# Patient Record
Sex: Male | Born: 1945
Health system: Southern US, Community
[De-identification: ages and names within clinical notes are randomized; demographics above are authoritative.]

## PROBLEM LIST (undated history)

## (undated) DIAGNOSIS — J449 Chronic obstructive pulmonary disease, unspecified: Secondary | ICD-10-CM

## (undated) DIAGNOSIS — H269 Unspecified cataract: Secondary | ICD-10-CM

## (undated) DIAGNOSIS — F419 Anxiety disorder, unspecified: Secondary | ICD-10-CM

## (undated) DIAGNOSIS — R0902 Hypoxemia: Secondary | ICD-10-CM

## (undated) DIAGNOSIS — J439 Emphysema, unspecified: Secondary | ICD-10-CM

## (undated) DIAGNOSIS — T7840XA Allergy, unspecified, initial encounter: Secondary | ICD-10-CM

## (undated) DIAGNOSIS — K635 Polyp of colon: Secondary | ICD-10-CM

## (undated) DIAGNOSIS — M1990 Unspecified osteoarthritis, unspecified site: Secondary | ICD-10-CM

## (undated) DIAGNOSIS — Z5189 Encounter for other specified aftercare: Secondary | ICD-10-CM

## (undated) DIAGNOSIS — E785 Hyperlipidemia, unspecified: Secondary | ICD-10-CM

## (undated) HISTORY — PX: COLONOSCOPY: SHX174

## (undated) HISTORY — DX: Anxiety disorder, unspecified: F41.9

## (undated) HISTORY — DX: Emphysema, unspecified: J43.9

## (undated) HISTORY — DX: Hyperlipidemia, unspecified: E78.5

## (undated) HISTORY — PX: HERNIA REPAIR: SHX51

## (undated) HISTORY — PX: POLYPECTOMY: SHX149

## (undated) HISTORY — DX: Chronic obstructive pulmonary disease, unspecified: J44.9

## (undated) HISTORY — DX: Hypoxemia: R09.02

## (undated) HISTORY — PX: TONSILLECTOMY: SUR1361

## (undated) HISTORY — DX: Unspecified cataract: H26.9

## (undated) HISTORY — DX: Allergy, unspecified, initial encounter: T78.40XA

## (undated) HISTORY — DX: Polyp of colon: K63.5

## (undated) HISTORY — DX: Unspecified osteoarthritis, unspecified site: M19.90

## (undated) HISTORY — DX: Encounter for other specified aftercare: Z51.89

---

## 1983-04-12 HISTORY — PX: LUNG REMOVAL, PARTIAL: SHX233

## 2006-02-15 ENCOUNTER — Ambulatory Visit (HOSPITAL_COMMUNITY): Admission: RE | Admit: 2006-02-15 | Discharge: 2006-02-15 | Payer: Self-pay | Admitting: General Surgery

## 2010-02-16 ENCOUNTER — Encounter (INDEPENDENT_AMBULATORY_CARE_PROVIDER_SITE_OTHER): Payer: Self-pay | Admitting: *Deleted

## 2010-02-24 ENCOUNTER — Encounter (INDEPENDENT_AMBULATORY_CARE_PROVIDER_SITE_OTHER): Payer: Self-pay | Admitting: *Deleted

## 2010-02-25 ENCOUNTER — Ambulatory Visit: Payer: Self-pay | Admitting: Gastroenterology

## 2010-05-11 NOTE — Letter (Signed)
Summary: [facility] Instructions  [facility]  [address] [place], [place] [address]   Phone: [phone]  Fax: [phone]       KOLBI ALTADONNA    May 20, 1965    MRN: [id number]        Procedure Day Joshua Cross:  [facility]  03/10/10     Arrival Time:  10:30AM     Procedure Time:  11:30AM     Location of Procedure:                    _X _  [facility] (4th Floor)                       PREPARATION FOR COLONOSCOPY WITH MOVIPREP   Starting 5 days prior to your procedure 03/05/10 do not eat nuts, seeds, popcorn, corn, beans, peas,  salads, or any raw vegetables.  Do not take any fiber supplements (e.g. Metamucil, Citrucel, and Benefiber).  THE DAY BEFORE YOUR PROCEDURE         DATE: 03/09/10   DAY: TUESDAY  1.  Drink clear liquids the entire day-NO SOLID FOOD  2.  Do not drink anything colored red or purple.  Avoid juices with pulp.  No orange juice.  3.  Drink at least 64 oz. (8 glasses) of fluid/clear liquids during the day to prevent dehydration and help the prep work efficiently.  CLEAR LIQUIDS INCLUDE: Water Jello Ice Popsicles Tea (sugar ok, no milk/cream) Powdered fruit flavored drinks Coffee (sugar ok, no milk/cream) Gatorade Juice: apple, white grape, white cranberry  Lemonade Clear bullion, consomm, broth Carbonated beverages (any kind) Strained chicken noodle soup Hard Candy                             4.  In the morning, mix first dose of MoviPrep solution:    Empty 1 Pouch A and 1 Pouch B into the disposable container    Add lukewarm drinking water to the top line of the container. Mix to dissolve    Refrigerate (mixed solution should be used within 24 hrs)  5.  Begin drinking the prep at 5:00 p.m. The MoviPrep container is divided by 4 marks.   Every 15 minutes drink the solution down to the next mark (approximately 8 oz) until the full liter is complete.   6.  Follow completed prep with 16 oz of clear liquid of your  choice (Nothing red or purple).  Continue to drink clear liquids until bedtime.  7.  Before going to bed, mix second dose of MoviPrep solution:    Empty 1 Pouch A and 1 Pouch B into the disposable container    Add lukewarm drinking water to the top line of the container. Mix to dissolve    Refrigerate  THE DAY OF YOUR PROCEDURE      DATE: 03/10/10   DAY: WEDNESDAY  Beginning at 6:30AM (5 hours before procedure):         1. Every 15 minutes, drink the solution down to the next mark (approx 8 oz) until the full liter is complete.  2. Follow completed prep with 16 oz. of clear liquid of your choice.    3. You may drink clear liquids until 9:30AM (2 HOURS BEFORE PROCEDURE).   MEDICATION INSTRUCTIONS  Unless otherwise instructed, you should take regular prescription medications with a small sip of water   as early as possible the  morning of your procedure.        OTHER INSTRUCTIONS  You will need a responsible adult at least 66 years of age to accompany you and drive you home.   This person must remain in the waiting room during your procedure.  Wear loose fitting clothing that is easily removed.  Leave jewelry and other valuables at home.  However, you may wish to bring a book to read or  an iPod/MP3 player to listen to music as you wait for your procedure to start.  Remove all body piercing jewelry and leave at home.  Total time from sign-in until discharge is approximately 2-3 hours.  You should go home directly after your procedure and rest.  You can resume normal activities the  day after your procedure.  The day of your procedure you should not:   Drive   Make legal decisions   Operate machinery   Drink alcohol   Return to work  You will receive specific instructions about eating, activities and medications before you leave.    The above instructions have been reviewed and explained to me by   Wyona Almas RN  February 25, 2010 2:36 PM     I  fully understand and can verbalize these instructions _____________________________ Date _________

## 2010-05-11 NOTE — Miscellaneous (Signed)
Summary: [facility] Previsit/prep  Clinical Lists Changes  Medications: Added new medication of MOVIPREP 100 GM  SOLR (PEG-KCL-NACL-NASULF-NA ASC-C) As per prep instructions. - Signed Rx of MOVIPREP 100 GM  SOLR (PEG-KCL-NACL-NASULF-NA ASC-C) As per prep instructions.;  #1 x 0;  Signed;  Entered by: Wyona Almas RN;  Authorized by: Mardella Layman MD [facility];  Method used: Electron[facility] to [facility]  [phone]*, [address], [place], [place]  [address], Ph: [phone] or [phone], Fax: [phone] Allergies: Added new allergy or adverse reaction of PENICILLIN Added new allergy or adverse reaction of ERYTHROMYCIN Observations: Added new observation of NKA: F (02/25/2010 14:01)    Prescriptions: MOVIPREP 100 GM  SOLR (PEG-KCL-NACL-NASULF-NA ASC-C) As per prep instructions.  #1 x 0   Entered by:   Wyona Almas RN   Authorized by:   Mardella Layman MD [facility]   Signed by:   Wyona Almas RN on 02/25/2010   Method used:   [facility]ronically to        [facility]  [phone]* (retail)       [address] [place], [place]  [address]       Ph: [phone] or [phone]       Fax: [phone]   RxID:   [id number]

## 2010-05-11 NOTE — Letter (Signed)
Summary: Pre Visit Letter Revised  [facility]  [address] [place], [place] [address]   Phone: [phone]  Fax: [phone]        02/16/2010 MRN: [id number]    [facility] [address] [place], [place]  [address]             Procedure Date:  03/10/10   Welcome to the Gastroenterology Division at [facility].    You are scheduled to see a nurse for your pre-procedure visit on Wednesday, 02/24/10 at 3:30 p.m.  on the 3rd floor at [facility], [address]  We ask that you try to arrive at our office 15 minutes prior to your appointment time to allow for check-in.  Please take a minute to review the attached form.  If you answer "Yes" to one or more of the questions on the first page, we ask that you call the person listed at your earliest opportunity.  If you answer "No" to all of the questions, please complete the rest of the form and bring it to your appointment.    Your nurse visit will consist of discussing your medical and surgical history, your immediate family medical history, and your medications.   If you are unable to list all of your medications on the form, please bring the medication bottles to your appointment and we will list them.  We will need to be aware of both prescribed and over the counter drugs.  We will need to know exact dosage information as well.    Please be prepared to read and sign documents such as consent forms, a financial agreement, and acknowledgement forms.  If necessary, and with your consent, a friend or relative is welcome to sit-in on the nurse visit with you.  Please bring your insurance card so that we may make a copy of it.  If your insurance requires a referral to see a specialist, please bring your referral form from your primary care physician.  No co-pay is required for this nurse visit.     If you cannot keep your appointment, please call [phone] to cancel or reschedule prior to your  appointment date.  This allows [place] the opportunity to schedule an appointment for another patient in need of care.    Thank you for choosing  Gastroenterology for your medical needs.  We appreciate the opportunity to care for you.  Please visit [place] at our website  to learn more about our practice.  Sincerely, The Gastroenterology Division

## 2010-08-27 NOTE — Op Note (Signed)
NAME:  Joshua Cross, Joshua Cross            ACCOUNT NO.:  [id number]   MEDICAL RECORD NO.:  [id number]          PATIENT TYPE:  AMB   LOCATION:  DAY                          FACILITY:  [facility]   PHYSICIAN:  Ollen Gross. Vernell Morgans, M.D. DATE OF BIRTH:  07-27-1945   DATE OF PROCEDURE:  02/15/2006  DATE OF DISCHARGE:                               OPERATIVE REPORT   PREOPERATIVE DIAGNOSIS:  Bilateral hernias.   POSTOPERATIVE DIAGNOSIS:  Bilateral inguinal hernias.   PROCEDURES:  Bilateral inguinal hernia repairs with mesh.   SURGEON:  Ollen Gross. Carolynne Edouard, M.D.   ANESTHESIA:  General.   PROCEDURE:  After informed consent was obtained, the patient was brought  to the operating placed in supine position on the operating table.  After induction of general anesthesia, the patient's abdomen, both  groins were prepped with Betadine and draped in usual sterile manner.  Attention was first turned to the right groin.  This area was  infiltrated 4% Marcaine and small incision was made from the edge of  pubic tubercle on the right towards the anterior superior iliac spine  for a distance of about 5 cm this incision was carried down through the  skin and subcutaneous tissue sharply with electrocautery until the  fascia of the external oblique was encountered.  Bridging vein was  clamped with hemostats, divided and ligated 3-0 silk ties.  The fascia  of the external oblique was opened along its fibers towards apex of the  external ring with a 15 blade knife and Metzenbaum scissors.  A  Weitlaner retractor was then deployed.  Blunt dissection of the cord  structures was carried out at the edge of the pubic tubercle until the  cord structures could be surrounded between two fingers.  A 1/2-inch  Penrose drain was placed around the cord structures for retraction  purposes.  The cord structures were gently skeletonized by combination  of blunt hemostat dissection and sharp dissection with electrocautery.  No hernia sac was  associated with the cord.  The patient did have defect  medial to this in the floor of the canal consistent with a direct  hernia.  This area was very broad-based and was therefore reduced  manually.  The floor of the canal was then repaired with a running 2-0  Vicryl stitch.  The tails of the stitch were left long at the edge of  the cord.  Next a piece of three piece of 3 x 6 Ultrapro mesh was chosen  and cut to fit.  The mesh was sewed inferiorly to the shelving edge of  inguinal ligament with a running 2-0 Prolene stitch.  Tails were cut in  the mesh laterally.  The tails of the Vicryl stitch were brought through  the mesh where it abutted the cord and tied down.  The tails of the mesh  were wrapped around the cord structures laterally.  Superiorly the mesh  was sewed to the muscular aponeurotic strength layer of the  transversalis with interrupted 2-0 Prolene vertical mattress stitches.  The tails of mesh were anchored laterally to the cord to the shelving  edge of  inguinal ligament with interrupted 2-0 Prolene stitch.  Once  this was accomplished, the mesh appeared to be in good position.  The  wound was irrigated copious amounts of saline.  The ilioinguinal nerve  had been identified and clamped proximally and distally, ligated and  tied with 3-0 silk ties.  The external oblique was then reapproximated  with a running 2-0 Vicryl stitch.  The wound was infiltrated more  quarter percent Marcaine.  The subcutaneous fascia was closed a running  3-0 Vicryl stitch and the skin was closed a running 4-0 Monocryl  subcuticular stitch.  Next attention was turned to the left groin and  again left groin was infiltrated 0.25% Marcaine with epinephrine.  A  small incision was made from the edge of the pubic tubercle on the left  towards the anterior superior iliac spine for distance of about 5 cm.  This incision was carried down to the skin and subcutaneous tissue  sharply with electrocautery  until the fascia of the external oblique was  encountered.  A small bridging vein was clamped with hemostats, divided  and ligated 3-0 silk ties.  The fascia of the external oblique was  opened along its fibers to the apex of the external ring with 15 blade  knife and Metzenbaum scissors.  A Weitlaner retractor was then deployed.  Blunt dissection was carried out of the cord structures at the edge of  the pubic tubercle until the cord structures could be surrounded between  two fingers.  A 1/2-inch Penrose drain was placed around the cord  structures for retraction purposes.  The cord was gently skeletonized by  combination of blunt hemostat dissection and sharp dissection with  electrocautery but no hernia sac was associated with the cord. Medial to  this the floor of the inguinal canal was weak with an obvious bulge.  This direct hernia had very broad base.  It was therefore reduced and  the floor of the inguinal canal was repaired with a running 2-0 Vicryl  stitch.  The tails of the Vicryl were left long at the edge of the cord.  Next the a 3 x 6 piece of Ultrapro mesh was chosen and cut to fit.  The  mesh was sewed inferiorly to the shelving edge of inguinal ligament with  a running 2-0 Prolene stitch.  Tails were cut in the mesh laterally.  The tails of Vicryl for brought through the mesh at the edge of the cord  and tied down.  The tails of mesh was then wrapped around the cord  structures laterally.  The mesh was then sewed to the muscular  aponeurotic strength layer of the transversalis superiorly with  interrupted 2-0 Prolene vertical mattress stitches and the tails of the  mesh were anchored to the shelving edge of the ligament lateral to the  cord with interrupted 2-0 Prolene stitch.  Once this was accomplished,  the mesh was in good position.  The wound was irrigated copious amounts  of saline.  The ilioinguinal nerve had been identified, clamped proximally and distally with  hemostats, divided and ligated 3-0 silk  ties.  The fascia of the external oblique was then reapproximated with  running 2-0 Vicryl stitch.  Wound was infiltrated then with the rest of  the 0.25% Marcaine.  The subcutaneous fascia was closed with running 3-0  Vicryl stitch and skin was closed with a running 4-0 Monocryl  subcuticular stitch.  Benzoin, Steri-Strips, sterile dressings were  applied.  The patient tolerated  well.  At the end of the case, all  needle, sponge, instrument counts correct.  The patient was then  awakened, taken recovery in stable condition.  Both the patient's  testicles were down in the sac at the end of the case.      Ollen Gross. Vernell Morgans, M.D.  Electronically Signed     PST/MEDQ  D:  02/16/2006  T:  02/17/2006  Job:  [id number]

## 2016-06-07 ENCOUNTER — Ambulatory Visit (INDEPENDENT_AMBULATORY_CARE_PROVIDER_SITE_OTHER): Payer: Medicare Other | Admitting: Internal Medicine

## 2016-06-07 ENCOUNTER — Ambulatory Visit (INDEPENDENT_AMBULATORY_CARE_PROVIDER_SITE_OTHER)
Admission: RE | Admit: 2016-06-07 | Discharge: 2016-06-07 | Disposition: A | Discharge status: Home / Self Care | Payer: Medicare Other | Source: Ambulatory Visit | Attending: Internal Medicine | Admitting: Internal Medicine

## 2016-06-07 VITALS — BP 110/68 | HR 63 | Temp 97.7°F | Resp 16 | Ht 68.0 in | Wt 141.1 lb

## 2016-06-07 DIAGNOSIS — J418 Mixed simple and mucopurulent chronic bronchitis: Secondary | ICD-10-CM | POA: Insufficient documentation

## 2016-06-07 DIAGNOSIS — R05 Cough: Secondary | ICD-10-CM | POA: Diagnosis not present

## 2016-06-07 DIAGNOSIS — R059 Cough, unspecified: Secondary | ICD-10-CM

## 2016-06-07 MED ORDER — FLUTICASONE-UMECLIDIN-VILANT 100-62.5-25 MCG/INH IN AEPB: 1.0000 | INHALATION_SPRAY | Freq: Every day | RESPIRATORY_TRACT | 11 refills | Status: DC

## 2016-06-07 NOTE — Patient Instructions (Signed)
Cough, Adult Coughing is a reflex that clears your throat and your airways. Coughing helps to heal and protect your lungs. It is normal to cough occasionally, but a cough that happens with other symptoms or lasts a long time may be a sign of a condition that needs treatment. A cough may last only 2-3 weeks (acute), or it may last longer than 8 weeks (chronic). What are the causes? Coughing is commonly caused by:  Breathing in substances that irritate your lungs.  A viral or bacterial respiratory infection.  Allergies.  Asthma.  Postnasal drip.  Smoking.  Acid backing up from the stomach into the esophagus (gastroesophageal reflux).  Certain medicines.  Chronic lung problems, including COPD (or rarely, lung cancer).  Other medical conditions such as heart failure.  Follow these instructions at home: Pay attention to any changes in your symptoms. Take these actions to help with your discomfort:  Take medicines only as told by your health care provider. ? If you were prescribed an antibiotic medicine, take it as told by your health care provider. Do not stop taking the antibiotic even if you start to feel better. ? Talk with your health care provider before you take a cough suppressant medicine.  Drink enough fluid to keep your urine clear or pale yellow.  If the air is dry, use a cold steam vaporizer or humidifier in your bedroom or your home to help loosen secretions.  Avoid anything that causes you to cough at work or at home.  If your cough is worse at night, try sleeping in a semi-upright position.  Avoid cigarette smoke. If you smoke, quit smoking. If you need help quitting, ask your health care provider.  Avoid caffeine.  Avoid alcohol.  Rest as needed.  Contact a health care provider if:  You have new symptoms.  You cough up pus.  Your cough does not get better after 2-3 weeks, or your cough gets worse.  You cannot control your cough with suppressant  medicines and you are losing sleep.  You develop pain that is getting worse or pain that is not controlled with pain medicines.  You have a fever.  You have unexplained weight loss.  You have night sweats. Get help right away if:  You cough up blood.  You have difficulty breathing.  Your heartbeat is very fast. This information is not intended to replace advice given to you by your health care provider. Make sure you discuss any questions you have with your health care provider. Document Released: 09/24/2010 Document Revised: 09/03/2015 Document Reviewed: 06/04/2014 Elsevier Interactive Patient Education  2017 Elsevier Inc.  

## 2016-06-07 NOTE — Progress Notes (Signed)
Pre visit review using our clinic review tool, if applicable. No additional management support is needed unless otherwise documented below in the visit note. 

## 2016-06-07 NOTE — Progress Notes (Signed)
Subjective:  Patient ID: Joshua Cross, male    DOB: [birth year]  Age: 71 y.o. MRN: [id number]  CC: Cough   HE NEEDS A NEW PCP   HPI Joshua Cross presents for Establishing a relationship with a new PCP. His primary care physician in [place] is retiring. I don't have any medical records today. He has a history of COPD and has chronic, intermittent nonproductive cough with occasional wheezing and shortness of breath. He has been using Advair discus and Spiriva inhaler to control the symptoms. He tells me he just had a physical about a month or 2 ago. He will have those records forwarded to me.  History Joshua Cross has a past medical history of COPD (chronic obstructive pulmonary disease) ([place]) and Hyperlipidemia.   He has a past surgical history that includes Hernia repair and Lung removal, partial (Right, 1985).   His family history includes Alcohol abuse in his mother; Depression in his sister; Heart disease in his sister; Stroke in his father.He reports that he quit smoking about 9 years ago. His smoking use included Cigarettes. He has never used smokeless tobacco. He reports that he does not drink alcohol or use drugs.  No outpatient prescriptions prior to visit.   No facility-administered medications prior to visit.     ROS Review of Systems  Constitutional: Negative for activity change, appetite change, chills, fatigue, fever and unexpected weight change.  HENT: Negative.  Negative for sore throat and trouble swallowing.   Respiratory: Positive for cough, shortness of breath and wheezing. Negative for chest tightness.   Cardiovascular: Negative.  Negative for chest pain, palpitations and leg swelling.  Gastrointestinal: Negative.  Negative for abdominal pain, constipation, diarrhea, nausea and vomiting.  Endocrine: Negative.   Genitourinary: Negative.  Negative for difficulty urinating.  Musculoskeletal: Negative for back pain, joint swelling and myalgias.  Skin:  Negative.   Allergic/Immunologic: Negative.   Neurological: Negative.  Negative for dizziness.  Hematological: Negative.  Negative for adenopathy. Does not bruise/bleed easily.  Psychiatric/Behavioral: Negative.     Objective:  BP 110/68 (BP Location: Left Arm, Patient Position: Sitting, Cuff Size: Normal)   Pulse 63   Temp 97.7 F (36.5 C) (Oral)   Resp 16   Ht 5\' 8"  (1.727 m)   Wt 141 lb 1.3 oz (64 kg)   SpO2 93%   BMI 21.45 kg/m   Physical Exam  No results found for: WBC, HGB, HCT, PLT, GLUCOSE, CHOL, TRIG, HDL, LDLDIRECT, LDLCALC, ALT, AST, NA, K, CL, CREATININE, BUN, CO2, TSH, PSA, INR, GLUF, HGBA1C, MICROALBUR  Assessment & Plan:   Joshua Cross was seen today for cough.  Diagnoses and all orders for this visit:  Mixed simple and mucopurulent chronic bronchitis ([place])- will consolidate his inhaler therapy to 1 inhaler that contains the LAMA/LABA/ICS. -     Fluticasone-Umeclidin-Vilant (TRELEGY ELLIPTA) 100-62.5-25 MCG/INH AEPB; Inhale 1 Act into the lungs daily. -     DG Chest 2 View; Future  Cough- his chest x-ray reveals scarring and emphysema but there is no evidence of malignancy or pneumonia. -     DG Chest 2 View; Future   I have discontinued Joshua Cross's SPIRIVA HANDIHALER and ADVAIR DISKUS. I am also having him start on Fluticasone-Umeclidin-Vilant. Additionally, I am having him maintain his rosuvastatin, PARoxetine, LUMIGAN, and PROAIR HFA.  Meds ordered this encounter  Medications  . DISCONTD: SPIRIVA HANDIHALER 18 MCG inhalation capsule    Refill:  0  . rosuvastatin (CRESTOR) 10 MG tablet    Refill:  0  . PARoxetine (PAXIL) 20 MG tablet    Refill:  0  . DISCONTD: ADVAIR DISKUS 250-50 MCG/DOSE AEPB    Refill:  0  . LUMIGAN 0.01 % SOLN    Sig: instill 1 drop into both eyes once daily at bedtime    Refill:  0  . PROAIR HFA 108 (90 Base) MCG/ACT inhaler    Sig: inhale 2 puffs by mouth every 4 hours    Refill:  0  . Fluticasone-Umeclidin-Vilant (TRELEGY  ELLIPTA) 100-62.5-25 MCG/INH AEPB    Sig: Inhale 1 Act into the lungs daily.    Dispense:  28 each    Refill:  11     Follow-up: Return in about 4 months (around 10/05/2016).  Scarlette Calico, MD

## 2016-06-08 ENCOUNTER — Encounter: Payer: Self-pay | Admitting: Internal Medicine

## 2016-06-13 ENCOUNTER — Other Ambulatory Visit: Payer: Self-pay | Admitting: Internal Medicine

## 2016-07-26 ENCOUNTER — Other Ambulatory Visit: Payer: Self-pay | Admitting: Internal Medicine

## 2016-07-26 ENCOUNTER — Telehealth: Payer: Self-pay | Admitting: Internal Medicine

## 2016-07-26 DIAGNOSIS — J301 Allergic rhinitis due to pollen: Secondary | ICD-10-CM

## 2016-07-26 MED ORDER — METHYLPREDNISOLONE 4 MG PO TBPK: ORAL_TABLET | ORAL | 0 refills | Status: DC

## 2016-07-26 NOTE — Telephone Encounter (Signed)
ROI faxed to [facility] on 06/10/2016 refaxed 07/26/2016. [facility]

## 2016-08-11 ENCOUNTER — Telehealth: Payer: Self-pay | Admitting: Internal Medicine

## 2016-08-11 NOTE — Telephone Encounter (Signed)
[facility] pages sent to Dr.Thomas Jones at [facility]. PWR

## 2016-09-02 ENCOUNTER — Encounter: Payer: Self-pay | Admitting: Internal Medicine

## 2016-09-02 ENCOUNTER — Other Ambulatory Visit: Payer: Self-pay | Admitting: Internal Medicine

## 2016-09-02 DIAGNOSIS — Z1211 Encounter for screening for malignant neoplasm of colon: Secondary | ICD-10-CM

## 2016-09-02 DIAGNOSIS — Z1159 Encounter for screening for other viral diseases: Secondary | ICD-10-CM | POA: Insufficient documentation

## 2016-09-02 DIAGNOSIS — J418 Mixed simple and mucopurulent chronic bronchitis: Secondary | ICD-10-CM

## 2016-09-02 MED ORDER — FLUTICASONE-UMECLIDIN-VILANT 100-62.5-25 MCG/INH IN AEPB: 1.0000 | INHALATION_SPRAY | Freq: Every day | RESPIRATORY_TRACT | 11 refills | Status: DC

## 2016-09-06 ENCOUNTER — Telehealth: Payer: Self-pay

## 2016-09-06 NOTE — Telephone Encounter (Signed)
Key: [id number]

## 2016-09-12 ENCOUNTER — Other Ambulatory Visit: Payer: Self-pay | Admitting: Internal Medicine

## 2016-09-12 DIAGNOSIS — J418 Mixed simple and mucopurulent chronic bronchitis: Secondary | ICD-10-CM

## 2016-09-12 MED ORDER — CEFDINIR 300 MG PO CAPS: 300.0000 mg | ORAL_CAPSULE | Freq: Two times a day (BID) | ORAL | 0 refills | Status: AC

## 2016-09-14 NOTE — Telephone Encounter (Signed)
PA was denied. Is there an alternative that can be prescribed.  

## 2016-09-14 NOTE — Telephone Encounter (Signed)
He has enough samples for now

## 2016-09-14 NOTE — Telephone Encounter (Signed)
Will leave as it for now. Closing note

## 2016-09-15 ENCOUNTER — Telehealth: Payer: Self-pay | Admitting: Internal Medicine

## 2016-09-15 NOTE — Telephone Encounter (Signed)
Records have been placed on 09/13/16 AM DOD-Dr.Perry's desk for review.

## 2016-09-19 ENCOUNTER — Encounter: Payer: Self-pay | Admitting: Internal Medicine

## 2016-10-28 ENCOUNTER — Other Ambulatory Visit: Payer: Self-pay | Admitting: Internal Medicine

## 2016-10-28 ENCOUNTER — Encounter: Payer: Self-pay | Admitting: Internal Medicine

## 2016-10-28 DIAGNOSIS — J418 Mixed simple and mucopurulent chronic bronchitis: Secondary | ICD-10-CM

## 2016-10-28 MED ORDER — CEFDINIR 300 MG PO CAPS: 300.0000 mg | ORAL_CAPSULE | Freq: Two times a day (BID) | ORAL | 0 refills | Status: AC

## 2016-11-24 ENCOUNTER — Ambulatory Visit (AMBULATORY_SURGERY_CENTER): Payer: Self-pay

## 2016-11-24 VITALS — Ht 66.5 in | Wt 133.2 lb

## 2016-11-24 DIAGNOSIS — Z860100 Personal history of colon polyps, unspecified: Secondary | ICD-10-CM

## 2016-11-24 MED ORDER — SUPREP BOWEL PREP KIT 17.5-3.13-1.6 GM/177ML PO SOLN: 1.0000 | Freq: Once | ORAL | 0 refills | Status: AC

## 2016-11-24 NOTE — Progress Notes (Signed)
No allergies to eggs or soy No diet meds No home oxygen No past problems with anesthesia  Declined emmi 

## 2016-11-25 ENCOUNTER — Other Ambulatory Visit: Payer: Self-pay | Admitting: Internal Medicine

## 2016-11-25 ENCOUNTER — Encounter: Payer: Self-pay | Admitting: Internal Medicine

## 2016-11-25 DIAGNOSIS — J418 Mixed simple and mucopurulent chronic bronchitis: Secondary | ICD-10-CM

## 2016-11-25 MED ORDER — CEFDINIR 300 MG PO CAPS: 300.0000 mg | ORAL_CAPSULE | Freq: Two times a day (BID) | ORAL | 0 refills | Status: DC

## 2016-11-25 MED ORDER — ROFLUMILAST 250 MCG PO TABS: 1.0000 | ORAL_TABLET | Freq: Every day | ORAL | 11 refills | Status: DC

## 2016-11-29 ENCOUNTER — Telehealth: Payer: Self-pay

## 2016-11-29 NOTE — Telephone Encounter (Signed)
Joshua Cross started PA on 11/28/2016.   KEY: [id number] or [id number]

## 2016-12-06 ENCOUNTER — Inpatient Hospital Stay (HOSPITAL_COMMUNITY)
Admission: EM | Admit: 2016-12-06 | Discharge: 2016-12-08 | DRG: 193 | Disposition: A | Discharge status: Home / Self Care | Payer: Medicare Other | Attending: Internal Medicine | Admitting: Internal Medicine

## 2016-12-06 ENCOUNTER — Emergency Department (HOSPITAL_COMMUNITY): Payer: Medicare Other

## 2016-12-06 ENCOUNTER — Encounter (HOSPITAL_COMMUNITY): Payer: Self-pay | Admitting: *Deleted

## 2016-12-06 DIAGNOSIS — E7800 Pure hypercholesterolemia, unspecified: Secondary | ICD-10-CM | POA: Diagnosis not present

## 2016-12-06 DIAGNOSIS — J441 Chronic obstructive pulmonary disease with (acute) exacerbation: Secondary | ICD-10-CM

## 2016-12-06 DIAGNOSIS — Z880 Allergy status to penicillin: Secondary | ICD-10-CM

## 2016-12-06 DIAGNOSIS — Z79899 Other long term (current) drug therapy: Secondary | ICD-10-CM | POA: Diagnosis not present

## 2016-12-06 DIAGNOSIS — J189 Pneumonia, unspecified organism: Secondary | ICD-10-CM | POA: Diagnosis present

## 2016-12-06 DIAGNOSIS — R55 Syncope and collapse: Secondary | ICD-10-CM | POA: Diagnosis present

## 2016-12-06 DIAGNOSIS — E785 Hyperlipidemia, unspecified: Secondary | ICD-10-CM

## 2016-12-06 DIAGNOSIS — R0902 Hypoxemia: Secondary | ICD-10-CM

## 2016-12-06 DIAGNOSIS — T380X5A Adverse effect of glucocorticoids and synthetic analogues, initial encounter: Secondary | ICD-10-CM | POA: Diagnosis not present

## 2016-12-06 DIAGNOSIS — E860 Dehydration: Secondary | ICD-10-CM | POA: Diagnosis present

## 2016-12-06 DIAGNOSIS — Z881 Allergy status to other antibiotic agents status: Secondary | ICD-10-CM | POA: Diagnosis not present

## 2016-12-06 DIAGNOSIS — Z87891 Personal history of nicotine dependence: Secondary | ICD-10-CM

## 2016-12-06 DIAGNOSIS — J9601 Acute respiratory failure with hypoxia: Secondary | ICD-10-CM

## 2016-12-06 DIAGNOSIS — Z7982 Long term (current) use of aspirin: Secondary | ICD-10-CM | POA: Diagnosis not present

## 2016-12-06 DIAGNOSIS — E871 Hypo-osmolality and hyponatremia: Secondary | ICD-10-CM | POA: Diagnosis not present

## 2016-12-06 DIAGNOSIS — J440 Chronic obstructive pulmonary disease with acute lower respiratory infection: Secondary | ICD-10-CM | POA: Diagnosis present

## 2016-12-06 DIAGNOSIS — R739 Hyperglycemia, unspecified: Secondary | ICD-10-CM | POA: Diagnosis not present

## 2016-12-06 LAB — COMPREHENSIVE METABOLIC PANEL
ALBUMIN: 4.4 g/dL (ref 3.5–5.0)
ALT: 20 U/L (ref 17–63)
ANION GAP: 13 (ref 5–15)
AST: 26 U/L (ref 15–41)
Alkaline Phosphatase: 116 U/L (ref 38–126)
BUN: 8 mg/dL (ref 6–20)
CALCIUM: 9.4 mg/dL (ref 8.9–10.3)
CHLORIDE: 102 mmol/L (ref 101–111)
CO2: 23 mmol/L (ref 22–32)
CREATININE: 0.77 mg/dL (ref 0.61–1.24)
Glucose, Bld: 127 mg/dL — ABNORMAL HIGH (ref 65–99)
Potassium: 3.8 mmol/L (ref 3.5–5.1)
Sodium: 138 mmol/L (ref 135–145)
Total Bilirubin: 0.8 mg/dL (ref 0.3–1.2)
Total Protein: 7.8 g/dL (ref 6.5–8.1)

## 2016-12-06 LAB — TROPONIN I

## 2016-12-06 LAB — CBC WITH DIFFERENTIAL/PLATELET
BASOS ABS: 0 10*3/uL (ref 0.0–0.1)
BASOS PCT: 0 %
EOS PCT: 0 %
Eosinophils Absolute: 0 10*3/uL (ref 0.0–0.7)
HEMATOCRIT: 50.7 % (ref 39.0–52.0)
Hemoglobin: 17.7 g/dL — ABNORMAL HIGH (ref 13.0–17.0)
LYMPHS PCT: 9 %
Lymphs Abs: 1.2 10*3/uL (ref 0.7–4.0)
MCH: 31.7 pg (ref 26.0–34.0)
MCHC: 34.9 g/dL (ref 30.0–36.0)
MCV: 90.7 fL (ref 78.0–100.0)
Monocytes Absolute: 0.4 10*3/uL (ref 0.1–1.0)
Monocytes Relative: 4 %
NEUTROS ABS: 10.8 10*3/uL — AB (ref 1.7–7.7)
Neutrophils Relative %: 87 %
PLATELETS: 215 10*3/uL (ref 150–400)
RBC: 5.59 MIL/uL (ref 4.22–5.81)
RDW: 13.4 % (ref 11.5–15.5)
WBC: 12.4 10*3/uL — AB (ref 4.0–10.5)

## 2016-12-06 LAB — BRAIN NATRIURETIC PEPTIDE: B NATRIURETIC PEPTIDE 5: 53 pg/mL (ref 0.0–100.0)

## 2016-12-06 MED ORDER — LEVOFLOXACIN IN D5W 750 MG/150ML IV SOLN
750.0000 mg | Freq: Every day | INTRAVENOUS | Status: DC
  Administered 2016-12-07 (×2): 750 mg via INTRAVENOUS
  Filled 2016-12-06 (×3): qty 150

## 2016-12-06 MED ORDER — OMEGA-3-ACID ETHYL ESTERS 1 G PO CAPS
1.0000 g | ORAL_CAPSULE | Freq: Two times a day (BID) | ORAL | Status: DC
  Administered 2016-12-07 – 2016-12-08 (×3): 1 g via ORAL
  Filled 2016-12-06 (×3): qty 1

## 2016-12-06 MED ORDER — ACETAMINOPHEN 325 MG PO TABS: 650.0000 mg | ORAL_TABLET | Freq: Four times a day (QID) | ORAL | Status: DC | PRN

## 2016-12-06 MED ORDER — DOXYCYCLINE HYCLATE 100 MG PO TABS
100.0000 mg | ORAL_TABLET | Freq: Once | ORAL | Status: AC
  Administered 2016-12-06: 100 mg via ORAL
  Filled 2016-12-06: qty 1

## 2016-12-06 MED ORDER — IPRATROPIUM-ALBUTEROL 0.5-2.5 (3) MG/3ML IN SOLN
3.0000 mL | RESPIRATORY_TRACT | Status: DC
  Administered 2016-12-06: 3 mL via RESPIRATORY_TRACT
  Filled 2016-12-06: qty 3

## 2016-12-06 MED ORDER — ONDANSETRON HCL 4 MG/2ML IJ SOLN: 4.0000 mg | Freq: Four times a day (QID) | INTRAMUSCULAR | Status: DC | PRN

## 2016-12-06 MED ORDER — MAGNESIUM OXIDE 400 (241.3 MG) MG PO TABS
400.0000 mg | ORAL_TABLET | Freq: Every day | ORAL | Status: DC
  Administered 2016-12-07 – 2016-12-08 (×2): 400 mg via ORAL
  Filled 2016-12-06 (×2): qty 1

## 2016-12-06 MED ORDER — METHYLPREDNISOLONE SODIUM SUCC 125 MG IJ SOLR
60.0000 mg | Freq: Four times a day (QID) | INTRAMUSCULAR | Status: DC
  Administered 2016-12-07 – 2016-12-08 (×7): 60 mg via INTRAVENOUS
  Filled 2016-12-06 (×7): qty 2

## 2016-12-06 MED ORDER — IPRATROPIUM-ALBUTEROL 0.5-2.5 (3) MG/3ML IN SOLN
3.0000 mL | RESPIRATORY_TRACT | Status: DC | PRN
  Administered 2016-12-07: 3 mL via RESPIRATORY_TRACT
  Filled 2016-12-06: qty 3

## 2016-12-06 MED ORDER — HYDROCODONE-ACETAMINOPHEN 5-325 MG PO TABS: 1.0000 | ORAL_TABLET | ORAL | Status: DC | PRN

## 2016-12-06 MED ORDER — ROFLUMILAST 500 MCG PO TABS
250.0000 ug | ORAL_TABLET | Freq: Every day | ORAL | Status: DC
  Administered 2016-12-07 – 2016-12-08 (×2): 250 ug via ORAL
  Filled 2016-12-06 (×2): qty 1

## 2016-12-06 MED ORDER — IOPAMIDOL (ISOVUE-370) INJECTION 76%
INTRAVENOUS | Status: AC
  Filled 2016-12-06: qty 100

## 2016-12-06 MED ORDER — ROSUVASTATIN CALCIUM 10 MG PO TABS
10.0000 mg | ORAL_TABLET | Freq: Every day | ORAL | Status: DC
  Administered 2016-12-07: 10 mg via ORAL
  Filled 2016-12-06: qty 1

## 2016-12-06 MED ORDER — ASPIRIN EC 81 MG PO TBEC
81.0000 mg | DELAYED_RELEASE_TABLET | Freq: Every day | ORAL | Status: DC
  Administered 2016-12-07 – 2016-12-08 (×2): 81 mg via ORAL
  Filled 2016-12-06 (×2): qty 1

## 2016-12-06 MED ORDER — PAROXETINE HCL 20 MG PO TABS
20.0000 mg | ORAL_TABLET | Freq: Every day | ORAL | Status: DC
  Administered 2016-12-07 – 2016-12-08 (×2): 20 mg via ORAL
  Filled 2016-12-06 (×2): qty 1

## 2016-12-06 MED ORDER — METHYLPREDNISOLONE SODIUM SUCC 125 MG IJ SOLR
125.0000 mg | Freq: Once | INTRAMUSCULAR | Status: AC
  Administered 2016-12-06: 125 mg via INTRAVENOUS
  Filled 2016-12-06: qty 2

## 2016-12-06 MED ORDER — LATANOPROST 0.005 % OP SOLN
1.0000 [drp] | Freq: Every day | OPHTHALMIC | Status: DC
  Administered 2016-12-07 (×2): 1 [drp] via OPHTHALMIC
  Filled 2016-12-06: qty 2.5

## 2016-12-06 MED ORDER — RISAQUAD PO CAPS
1.0000 | ORAL_CAPSULE | Freq: Every day | ORAL | Status: DC
  Administered 2016-12-07 – 2016-12-08 (×2): 1 via ORAL
  Filled 2016-12-06 (×2): qty 1

## 2016-12-06 MED ORDER — SODIUM CHLORIDE 0.9 % IV SOLN
INTRAVENOUS | Status: AC
  Administered 2016-12-07: 1000 mL via INTRAVENOUS

## 2016-12-06 MED ORDER — ENOXAPARIN SODIUM 40 MG/0.4ML ~~LOC~~ SOLN
40.0000 mg | Freq: Every day | SUBCUTANEOUS | Status: DC
  Administered 2016-12-07 (×2): 40 mg via SUBCUTANEOUS
  Filled 2016-12-06 (×2): qty 0.4

## 2016-12-06 MED ORDER — IOPAMIDOL (ISOVUE-370) INJECTION 76%
100.0000 mL | Freq: Once | INTRAVENOUS | Status: AC | PRN
Start: 2016-12-06 — End: 2016-12-06
  Administered 2016-12-06: 100 mL via INTRAVENOUS

## 2016-12-06 MED ORDER — UMECLIDINIUM-VILANTEROL 62.5-25 MCG/INH IN AEPB
1.0000 | INHALATION_SPRAY | Freq: Every day | RESPIRATORY_TRACT | Status: DC
  Administered 2016-12-07 – 2016-12-08 (×2): 1 via RESPIRATORY_TRACT
  Filled 2016-12-06: qty 14

## 2016-12-06 MED ORDER — MELATONIN 10 MG PO CAPS: 1.0000 | ORAL_CAPSULE | Freq: Every day | ORAL | Status: DC

## 2016-12-06 NOTE — ED Provider Notes (Signed)
[facility] DEPT Provider Note   CSN: [id number] Arrival date & time: 12/06/16  1755     History   Chief Complaint Chief Complaint  Patient presents with  . Shortness of Breath    HPI JEF FUTCH is a 71 y.o. male.   Shortness of Breath  This is a new problem. The problem occurs continuously.The current episode started 1 to 2 hours ago. The problem has been gradually improving. Associated symptoms include cough and wheezing. Pertinent negatives include no fever, no ear pain, no neck pain, no PND and no orthopnea.    Past Medical History:  Diagnosis Date  . COPD (chronic obstructive pulmonary disease) ([place])   . Hyperlipidemia     Patient Active Problem List   Diagnosis Date Noted  . CAP (community acquired pneumonia) 12/06/2016  . COPD with acute exacerbation ([place]) 12/06/2016  . Acute respiratory failure with hypoxia ([place]) 12/06/2016  . Hyperlipidemia 12/06/2016  . Screen for colon cancer 09/02/2016  . Seasonal allergic rhinitis due to pollen 07/26/2016  . Mixed simple and mucopurulent chronic bronchitis ([place]) 06/07/2016    Past Surgical History:  Procedure Laterality Date  . HERNIA REPAIR    . LUNG REMOVAL, PARTIAL Right 1985  . TONSILLECTOMY         Home Medications    Prior to Admission medications   Medication Sig Start Date End Date Taking? Authorizing Provider  aspirin EC 81 MG tablet Take 81 mg by mouth daily.   Yes [provider]  Coenzyme Q10 (CO Q 10 PO) Take 1 tablet by mouth daily.    Yes [provider]  Fluticasone-Umeclidin-Vilant (TRELEGY ELLIPTA) 100-62.5-25 MCG/INH AEPB Inhale 1 Act into the lungs daily. 09/02/16  Yes Janith Lima, MD  LUMIGAN 0.01 % SOLN instill 1 drop into both eyes once daily at bedtime 06/01/16  Yes [provider]  magnesium gluconate (MAGONATE) 500 MG tablet Take 500 mg by mouth daily.   Yes [provider]  Melatonin 10 MG CAPS Take 1 capsule by mouth daily.    Yes  [provider]  Misc Natural Products (TART CHERRY ADVANCED PO) Take 1 tablet by mouth daily.    Yes [provider]  Omega-3 Fatty Acids (FISH OIL) 1200 MG CAPS Take 1 capsule by mouth 2 (two) times daily.    Yes [provider]  OVER THE COUNTER MEDICATION Supports liver and lung   Yes [provider]  OVER THE COUNTER MEDICATION Take 1 tablet by mouth daily. Ashwagandha 500mg     Yes [provider]  PARoxetine (PAXIL) 20 MG tablet Take 20 mg by mouth daily.  06/03/16  Yes [provider]  Potassium 99 MG TABS Take 1 tablet by mouth daily.    Yes [provider]  PROAIR HFA 108 [phone] Base) MCG/ACT inhaler inhale 2 puffs by mouth every 4 hours 05/02/16  Yes [provider]  Probiotic Product (PROBIOTIC ADVANCED PO) Take 1 capsule by mouth daily. From deep roots    Yes [provider]  Roflumilast (DALIRESP) 250 MCG TABS Take 1 tablet by mouth daily. 11/25/16  Yes Janith Lima, MD  rosuvastatin (CRESTOR) 10 MG tablet Take 10 mg by mouth daily.  04/23/16  Yes [provider]  TURMERIC PO Take 1 tablet by mouth daily.    Yes [provider]  cefdinir (OMNICEF) 300 MG capsule Take 1 capsule (300 mg total) by mouth 2 (two) times daily. Patient not taking: Reported on 12/06/2016 11/25/16  Janith Lima, MD    Family History Family History  Problem Relation Age of Onset  . Alcohol abuse Mother   . Stroke Father   . Depression Sister   . Heart disease Sister   . Cancer Neg Hx   . COPD Neg Hx   . Early death Neg Hx   . Kidney disease Neg Hx   . Hypertension Neg Hx   . Learning disabilities Neg Hx     Social History Social History  Substance Use Topics  . Smoking status: Former Smoker    Types: Cigarettes    Quit date: 08/07/2006  . Smokeless tobacco: Never Used  . Alcohol use No     Allergies   Erythromycin and Penicillins   Review of Systems Review of Systems  Constitutional:  Negative for fever.  HENT: Negative for ear pain.   Respiratory: Positive for cough, shortness of breath and wheezing.   Cardiovascular: Negative for orthopnea and PND.  Musculoskeletal: Negative for neck pain.  All other systems reviewed and are negative.    Physical Exam Updated Vital Signs BP (!) 102/56   Pulse 77   Temp 99.7 F (37.6 C) (Oral)   Resp 15   SpO2 95%   Physical Exam  Constitutional: He is oriented to person, place, and time. He appears well-developed and well-nourished.  HENT:  Head: Normocephalic and atraumatic.  Eyes: Conjunctivae are normal.  Neck: Normal range of motion.  Cardiovascular: Normal rate.   Pulmonary/Chest: Tachypnea noted. He is in respiratory distress. He has decreased breath sounds. He has wheezes.  Abdominal: Soft. He exhibits no distension.  Musculoskeletal: Normal range of motion.  Neurological: He is alert and oriented to person, place, and time. No cranial nerve deficit. Coordination normal.  Skin: Skin is warm and dry.  Nursing note and vitals reviewed.    ED Treatments / Results  Labs (all labs ordered are listed, but only abnormal results are displayed) Labs Reviewed  CBC WITH DIFFERENTIAL/PLATELET - Abnormal; Notable for the following:       Result Value   WBC 12.4 (*)    Hemoglobin 17.7 (*)    Neutro Abs 10.8 (*)    All other components within normal limits  COMPREHENSIVE METABOLIC PANEL - Abnormal; Notable for the following:    Glucose, Bld 127 (*)    All other components within normal limits  CULTURE, EXPECTORATED SPUTUM-ASSESSMENT  GRAM STAIN  TROPONIN I  BRAIN NATRIURETIC PEPTIDE  HIV ANTIBODY (ROUTINE TESTING)  STREP PNEUMONIAE URINARY ANTIGEN  LEGIONELLA PNEUMOPHILA SEROGP 1 UR AG  BASIC METABOLIC PANEL  CBC WITH DIFFERENTIAL/PLATELET    EKG  EKG Interpretation  Date/Time:  Tuesday December 06 2016 18:17:09 EDT Ventricular Rate:  102 PR Interval:    QRS Duration: 99 QT Interval:  379 QTC  Calculation: 494 R Axis:   89 Text Interpretation:  Sinus tachycardia Borderline prolonged PR interval Consider right atrial enlargement Borderline right axis deviation Borderline abnrm T, anterolateral leads Borderline prolonged QT interval Confirmed by Merrily Pew [phone]) on 12/06/2016 7:15:58 PM       Radiology Ct Angio Chest Pe W And/or Wo Contrast  Result Date: 12/06/2016 CLINICAL DATA:  Shortness of breath and near syncope. EXAM: CT ANGIOGRAPHY CHEST WITH CONTRAST TECHNIQUE: Multidetector CT imaging of the chest was performed using the standard protocol during bolus administration of intravenous contrast. Multiplanar CT image reconstructions and MIPs were obtained to evaluate the vascular anatomy. CONTRAST:  100 mL Isovue 370 COMPARISON:  Chest x-ray June 07, 2016 FINDINGS: Cardiovascular: Satisfactory opacification of the pulmonary arteries to the segmental level. No evidence of pulmonary embolism. Normal heart size. No pericardial effusion. Mediastinum/Nodes: No enlarged mediastinal, hilar, or axillary lymph nodes. Thyroid gland, trachea, and esophagus demonstrate no significant findings. Lungs/Pleura: Severe emphysematous changes are identified throughout bilateral lungs. Patchy consolidations identified in the posterior bilateral lung bases. There is no pleural effusion or pulmonary edema. Upper Abdomen: No acute abnormality. Musculoskeletal: Degenerative joint changes of the spine are noted. Review of the MIP images confirms the above findings. IMPRESSION: No pulmonary embolus. Severe emphysematous changes of both lungs. Patchy consolidation of bilateral posterior lower lobes suspicious for pneumonia. Electronically Signed   By: Abelardo Diesel M.D.   On: 12/06/2016 20:25    Procedures Procedures (including critical care time)  CRITICAL CARE Performed by: Merrily Pew Total critical care time: 35 minutes Critical care time was exclusive of separately billable procedures and treating  other patients. Critical care was necessary to treat or prevent imminent or life-threatening deterioration. Critical care was time spent personally by me on the following activities: development of treatment plan with patient and/or surrogate as well as nursing, discussions with consultants, evaluation of patient's response to treatment, examination of patient, obtaining history from patient or surrogate, ordering and performing treatments and interventions, ordering and review of laboratory studies, ordering and review of radiographic studies, pulse oximetry and re-evaluation of patient's condition.   Medications Ordered in ED Medications  iopamidol (ISOVUE-370) 76 % injection (not administered)  Roflumilast TABS 1 tablet (not administered)  aspirin EC tablet 81 mg (not administered)  magnesium gluconate (MAGONATE) tablet 500 mg (not administered)  omega-3 acid ethyl esters (LOVAZA) capsule 1 g (not administered)  PROBIOTIC ADVANCED CAPS 1 capsule (not administered)  Fluticasone-Umeclidin-Vilant 100-62.5-25 MCG/INH AEPB 1 Act (not administered)  latanoprost (XALATAN) 0.005 % ophthalmic solution 1 drop (not administered)  PARoxetine (PAXIL) tablet 20 mg (not administered)  rosuvastatin (CRESTOR) tablet 10 mg (not administered)  enoxaparin (LOVENOX) injection 40 mg (not administered)  0.9 %  sodium chloride infusion (not administered)  levofloxacin (LEVAQUIN) IVPB 750 mg (not administered)  methylPREDNISolone sodium succinate (SOLU-MEDROL) 125 mg/2 mL injection 60 mg (not administered)  ipratropium-albuterol (DUONEB) 0.5-2.5 (3) MG/3ML nebulizer solution 3 mL (not administered)  acetaminophen (TYLENOL) tablet 650 mg (not administered)  HYDROcodone-acetaminophen (NORCO/VICODIN) 5-325 MG per tablet 1-2 tablet (not administered)  ondansetron (ZOFRAN) injection 4 mg (not administered)  methylPREDNISolone sodium succinate (SOLU-MEDROL) 125 mg/2 mL injection 125 mg (125 mg Intravenous Given 12/06/16  1916)  doxycycline (VIBRA-TABS) tablet 100 mg (100 mg Oral Given 12/06/16 1912)  iopamidol (ISOVUE-370) 76 % injection 100 mL (100 mLs Intravenous Contrast Given 12/06/16 2000)     Initial Impression / Assessment and Plan / ED Course  I have reviewed the triage vital signs and the nursing notes.  Pertinent labs & imaging results that were available during my care of the patient were reviewed by me and considered in my medical decision making (see chart for details).     Suspect COPD exacerbation as cause for his hypoxic respiratory failure. Needs supplemental O2 not on same a thome. Negative for PE. Antibiotics/steroids/albuterol with improved symptoms but persistently hypoxic. Multiple reevaluations with improving symptoms.   Final Clinical Impressions(s) / ED Diagnoses   Final diagnoses:  Hypoxia  COPD exacerbation ([place])  Acute respiratory failure with hypoxia ([place])      Solomon Skowronek, Corene Cornea, MD 12/07/16 0000

## 2016-12-06 NOTE — H&P (Signed)
History and Physical    Joshua Cross BJS:[id number] DOB: [birth year] DOA: 12/06/2016  PCP: Janith Lima, MD   Patient coming from: Home  Chief Complaint: SOB, productive cough, lightheadheaded  HPI: Joshua Cross is a 71 y.o. male with medical history significant for chronic bronchitis and hyperlipidemia, and into the emergency department for evaluation of productive cough, dyspnea, and lightheadedness. Patient reports that he has had these symptoms recurring over the past couple months, transiently improving with courses of antibiotics, and just completed cefdinir 2 days ago. He reports some initial improvement in his symptoms when he first started this most recent course of antibiotics, but then began to worsen again just prior to completing the course. He denies fevers or chills and denies chest pain or palpitations, but notes dyspnea at rest, worse with exertion, and cough productive of thick white sputum. Denies lower extremity swelling or tenderness, and denies orthopnea.   ED Course: Upon arrival to the ED, patient is found to be afebrile, saturating 82% on room air, tachypneic, slightly tachycardic, and with stable blood pressure. EKG features a sinus tachycardia with rate 102. Chemistry panels unremarkable. CBC is notable for a leukocytosis to 12,400 and a polycythemia with hemoglobin 17.7. Troponin is undetectable and BNP is within normal limits. CTA PE study was obtained and negative for PE, but notable for severe emphysematous changes bilaterally and patchy consolidation in the bilateral posterior lower lobes suspicious for pneumonia. Patient was treated with 125 mg of IV Solu-Medrol and doxycycline in the ED. Is also treated with an albuterol neb. He has been hemodynamically stable in the ED, but continues to have a new oxygen requirement and is dyspneic at rest. He will be admitted to the medical surgical unit for ongoing evaluation and management of community-acquired  pneumonia with acute exacerbation and COPD and acute hypoxic respiratory failure.  Review of Systems:  All other systems reviewed and apart from HPI, are negative.  Past Medical History:  Diagnosis Date  . COPD (chronic obstructive pulmonary disease) ([place])   . Hyperlipidemia     Past Surgical History:  Procedure Laterality Date  . HERNIA REPAIR    . LUNG REMOVAL, PARTIAL Right 1985  . TONSILLECTOMY       reports that he quit smoking about 10 years ago. His smoking use included Cigarettes. He has never used smokeless tobacco. He reports that he does not drink alcohol or use drugs.  Allergies  Allergen Reactions  . Erythromycin     REACTION: GI Intolerance  . Penicillins     Has patient had a PCN reaction causing immediate rash, facial/tongue/throat swelling, SOB or lightheadedness with hypotension: Unknown Has patient had a PCN reaction causing severe rash involving mucus membranes or skin necrosis: Unknown Has patient had a PCN reaction that required hospitalization: Unknown Has patient had a PCN reaction occurring within the last 10 years: Unknown If all of the above answers are "NO", then may proceed with Cephalosporin use.     Family History  Problem Relation Age of Onset  . Alcohol abuse Mother   . Stroke Father   . Depression Sister   . Heart disease Sister   . Cancer Neg Hx   . COPD Neg Hx   . Early death Neg Hx   . Kidney disease Neg Hx   . Hypertension Neg Hx   . Learning disabilities Neg Hx      Prior to Admission medications   Medication Sig Start Date End Date Taking? Authorizing Provider  aspirin EC 81 MG tablet Take 81 mg by mouth daily.   Yes [provider]  Coenzyme Q10 (CO Q 10 PO) Take 1 tablet by mouth daily.    Yes [provider]  Fluticasone-Umeclidin-Vilant (TRELEGY ELLIPTA) 100-62.5-25 MCG/INH AEPB Inhale 1 Act into the lungs daily. 09/02/16  Yes Janith Lima, MD  LUMIGAN 0.01 % SOLN instill 1 drop into both eyes once  daily at bedtime 06/01/16  Yes [provider]  magnesium gluconate (MAGONATE) 500 MG tablet Take 500 mg by mouth daily.   Yes [provider]  Melatonin 10 MG CAPS Take 1 capsule by mouth daily.    Yes [provider]  Misc Natural Products (TART CHERRY ADVANCED PO) Take 1 tablet by mouth daily.    Yes [provider]  Omega-3 Fatty Acids (FISH OIL) 1200 MG CAPS Take 1 capsule by mouth 2 (two) times daily.    Yes [provider]  OVER THE COUNTER MEDICATION Supports liver and lung   Yes [provider]  OVER THE COUNTER MEDICATION Take 1 tablet by mouth daily. Ashwagandha 500mg     Yes [provider]  PARoxetine (PAXIL) 20 MG tablet Take 20 mg by mouth daily.  06/03/16  Yes [provider]  Potassium 99 MG TABS Take 1 tablet by mouth daily.    Yes [provider]  PROAIR HFA 108 [phone] Base) MCG/ACT inhaler inhale 2 puffs by mouth every 4 hours 05/02/16  Yes [provider]  Probiotic Product (PROBIOTIC ADVANCED PO) Take 1 capsule by mouth daily. From deep roots    Yes [provider]  Roflumilast (DALIRESP) 250 MCG TABS Take 1 tablet by mouth daily. 11/25/16  Yes Janith Lima, MD  rosuvastatin (CRESTOR) 10 MG tablet Take 10 mg by mouth daily.  04/23/16  Yes [provider]  TURMERIC PO Take 1 tablet by mouth daily.    Yes [provider]  cefdinir (OMNICEF) 300 MG capsule Take 1 capsule (300 mg total) by mouth 2 (two) times daily. Patient not taking: Reported on 12/06/2016 11/25/16   Janith Lima, MD    Physical Exam: Vitals:   12/06/16 2045 12/06/16 2130 12/06/16 2215 12/06/16 2300  BP: 128/66 118/80 128/89 117/66  Pulse: (!) 103 97 89 81  Resp: (!) 22 19 (!) 27 13  Temp:      TempSrc:      SpO2: (!) 89% 92% 90% 92%      Constitutional: Mild tachypnea, dyspneic with speech, calm, no pallor, no diaphoresis Eyes: PERTLA, lids and conjunctivae normal ENMT: Mucous membranes  are moist. Posterior pharynx clear of any exudate or lesions.   Neck: normal, supple, no masses, no thyromegaly Respiratory: Diminished bilaterally. Expiratory wheezing. Rhonchi at left base. Increased WOB. No pallor or cyanosis.  Cardiovascular: Rate ~110 and regular. No extremity edema. No significant JVD. Abdomen: No distension, no tenderness, no masses palpated. Bowel sounds normal.  Musculoskeletal: no clubbing / cyanosis. No joint deformity upper and lower extremities.   Skin: no significant rashes, lesions, ulcers. Warm, dry, well-perfused. Neurologic: CN 2-12 grossly intact. Sensation intact, DTR normal. Strength 5/5 in all 4 limbs.  Psychiatric: Alert and oriented x 3. Very pleasant, cooperative.     Labs on Admission: I have personally reviewed following labs and imaging studies  CBC:  Recent Labs Lab 12/06/16 1833  WBC 12.4*  NEUTROABS 10.8*  HGB 17.7*  HCT 50.7  MCV 90.7  PLT 315   Basic Metabolic Panel:  Recent Labs Lab 12/06/16 1833  NA 138  K 3.8  CL 102  CO2 23  GLUCOSE 127*  BUN 8  CREATININE 0.77  CALCIUM 9.4   GFR: Estimated Creatinine Clearance: 72.4 mL/min (by C-G formula based on SCr of 0.77 mg/dL). Liver Function Tests:  Recent Labs Lab 12/06/16 1833  AST 26  ALT 20  ALKPHOS 116  BILITOT 0.8  PROT 7.8  ALBUMIN 4.4   No results for input(s): LIPASE, AMYLASE in the last 168 hours. No results for input(s): AMMONIA in the last 168 hours. Coagulation Profile: No results for input(s): INR, PROTIME in the last 168 hours. Cardiac Enzymes:  Recent Labs Lab 12/06/16 1833  TROPONINI <0.03   BNP (last 3 results) No results for input(s): PROBNP in the last 8760 hours. HbA1C: No results for input(s): HGBA1C in the last 72 hours. CBG: No results for input(s): GLUCAP in the last 168 hours. Lipid Profile: No results for input(s): CHOL, HDL, LDLCALC, TRIG, CHOLHDL, LDLDIRECT in the last 72 hours. Thyroid Function Tests: No results for  input(s): TSH, T4TOTAL, FREET4, T3FREE, THYROIDAB in the last 72 hours. Anemia Panel: No results for input(s): VITAMINB12, FOLATE, FERRITIN, TIBC, IRON, RETICCTPCT in the last 72 hours. Urine analysis: No results found for: COLORURINE, APPEARANCEUR, LABSPEC, PHURINE, GLUCOSEU, HGBUR, BILIRUBINUR, KETONESUR, PROTEINUR, UROBILINOGEN, NITRITE, LEUKOCYTESUR Sepsis Labs: @LABRCNTIP (procalcitonin:4,lacticidven:4) )No results found for this or any previous visit (from the past 240 hour(s)).   Radiological Exams on Admission: Ct Angio Chest Pe W And/or Wo Contrast  Result Date: 12/06/2016 CLINICAL DATA:  Shortness of breath and near syncope. EXAM: CT ANGIOGRAPHY CHEST WITH CONTRAST TECHNIQUE: Multidetector CT imaging of the chest was performed using the standard protocol during bolus administration of intravenous contrast. Multiplanar CT image reconstructions and MIPs were obtained to evaluate the vascular anatomy. CONTRAST:  100 mL Isovue 370 COMPARISON:  Chest x-ray June 07, 2016 FINDINGS: Cardiovascular: Satisfactory opacification of the pulmonary arteries to the segmental level. No evidence of pulmonary embolism. Normal heart size. No pericardial effusion. Mediastinum/Nodes: No enlarged mediastinal, hilar, or axillary lymph nodes. Thyroid gland, trachea, and esophagus demonstrate no significant findings. Lungs/Pleura: Severe emphysematous changes are identified throughout bilateral lungs. Patchy consolidations identified in the posterior bilateral lung bases. There is no pleural effusion or pulmonary edema. Upper Abdomen: No acute abnormality. Musculoskeletal: Degenerative joint changes of the spine are noted. Review of the MIP images confirms the above findings. IMPRESSION: No pulmonary embolus. Severe emphysematous changes of both lungs. Patchy consolidation of bilateral posterior lower lobes suspicious for pneumonia. Electronically Signed   By: Abelardo Diesel M.D.   On: 12/06/2016 20:25    EKG:  Independently reviewed. Sinus tachycardia (rate 102).   Assessment/Plan  1. CAP, acute hypoxic respiratory failure  - Pt presents with SOB, wheezing, productive cough, all worsening over past few days despite completing cefdinir 2 days prior to admission  - He is found to have leukocytosis, hypoxia, basilar rhonchi, and CTA chest demonstrates bibasilar patchy consolidations concerning for PNA  - He was treated with doxycycline, systemic steroids, nebs, and supplemental O2 in ED  - Plan to continue empiric abx with Levaquin in setting of penicillin and azithromycin allergies  - Continue supplemental O2 prn    2. COPD with acute exacerbation  - Pt presents with wheezing and obstructive breathing pattern, likely precipitated by infection as above  - Treated in ED with 125 mg IV Solu-Medrol and albuterol nebs  - Plan to continue Trelegy, Daliresp, star nebs, continue systemic steroid with Solu-Medrol  60 IV q6h, check sputum culture and continue abx, continue supplemental O2 prn   3. Hyperlipidemia  - Continue Crestor    DVT prophylaxis: sq Lovenox Code Status: Full  Family Communication: Discussed with patient Disposition Plan: Admit to med-surg Consults called: None Admission status: Inpatient    Vianne Bulls, MD [facility] Pager [phone]  If 7PM-7AM, please contact night-coverage [email or web address] Password [facility]  12/06/2016, 11:26 PM

## 2016-12-06 NOTE — ED Triage Notes (Signed)
Per EMS,her for shortness of breath/near syncope. Pt came from [employer], where he works. Pt has hx of bronchitis. Pt finished antibiotics 2 days ago. Pt has hx of COPD. O2 sat was 82% on RA upon arrival. Pt was given albuterol treatment. 12 lead unremarkable.

## 2016-12-06 NOTE — ED Notes (Signed)
Pt O2 saturation at 87% RA placed pt on 2 LPM of oxygen

## 2016-12-07 DIAGNOSIS — J441 Chronic obstructive pulmonary disease with (acute) exacerbation: Secondary | ICD-10-CM

## 2016-12-07 DIAGNOSIS — E7800 Pure hypercholesterolemia, unspecified: Secondary | ICD-10-CM

## 2016-12-07 LAB — CBC WITH DIFFERENTIAL/PLATELET
Basophils Absolute: 0 10*3/uL (ref 0.0–0.1)
Basophils Relative: 0 %
EOS PCT: 0 %
Eosinophils Absolute: 0 10*3/uL (ref 0.0–0.7)
HCT: 36.4 % — ABNORMAL LOW (ref 39.0–52.0)
HEMOGLOBIN: 12.7 g/dL — AB (ref 13.0–17.0)
LYMPHS ABS: 0.8 10*3/uL (ref 0.7–4.0)
LYMPHS PCT: 5 %
MCH: 30.9 pg (ref 26.0–34.0)
MCHC: 34.9 g/dL (ref 30.0–36.0)
MCV: 88.6 fL (ref 78.0–100.0)
MONOS PCT: 1 %
Monocytes Absolute: 0.2 10*3/uL (ref 0.1–1.0)
NEUTROS PCT: 94 %
Neutro Abs: 14.7 10*3/uL — ABNORMAL HIGH (ref 1.7–7.7)
Platelets: 287 10*3/uL (ref 150–400)
RBC: 4.11 MIL/uL — AB (ref 4.22–5.81)
RDW: 13.3 % (ref 11.5–15.5)
WBC: 15.7 10*3/uL — AB (ref 4.0–10.5)

## 2016-12-07 LAB — EXPECTORATED SPUTUM ASSESSMENT W GRAM STAIN, RFLX TO RESP C

## 2016-12-07 LAB — GLUCOSE, CAPILLARY
GLUCOSE-CAPILLARY: 149 mg/dL — AB (ref 65–99)
GLUCOSE-CAPILLARY: 146 mg/dL — AB (ref 65–99)
Glucose-Capillary: 185 mg/dL — ABNORMAL HIGH (ref 65–99)

## 2016-12-07 LAB — BASIC METABOLIC PANEL
Anion gap: 11 (ref 5–15)
BUN: 10 mg/dL (ref 6–20)
CHLORIDE: 99 mmol/L — AB (ref 101–111)
CO2: 22 mmol/L (ref 22–32)
Calcium: 8.7 mg/dL — ABNORMAL LOW (ref 8.9–10.3)
Creatinine, Ser: 0.87 mg/dL (ref 0.61–1.24)
GFR calc Af Amer: >60 mL/min (ref >60)
GFR calc non Af Amer: >60 mL/min (ref >60)
Glucose, Bld: 207 mg/dL — ABNORMAL HIGH (ref 65–99)
POTASSIUM: 4 mmol/L (ref 3.5–5.1)
SODIUM: 132 mmol/L — AB (ref 135–145)

## 2016-12-07 LAB — EXPECTORATED SPUTUM ASSESSMENT W REFEX TO RESP CULTURE

## 2016-12-07 LAB — HEMOGLOBIN A1C
Hgb A1c MFr Bld: 5.9 % — ABNORMAL HIGH (ref 4.8–5.6)
Mean Plasma Glucose: 122.63 mg/dL

## 2016-12-07 LAB — STREP PNEUMONIAE URINARY ANTIGEN: Strep Pneumo Urinary Antigen: NEGATIVE

## 2016-12-07 LAB — HIV ANTIBODY (ROUTINE TESTING W REFLEX): HIV SCREEN 4TH GENERATION: NONREACTIVE

## 2016-12-07 MED ORDER — BUDESONIDE 0.25 MG/2ML IN SUSP
0.2500 mg | Freq: Two times a day (BID) | RESPIRATORY_TRACT | Status: DC
  Administered 2016-12-07 – 2016-12-08 (×4): 0.25 mg via RESPIRATORY_TRACT
  Filled 2016-12-07 (×5): qty 2

## 2016-12-07 MED ORDER — INSULIN DETEMIR 100 UNIT/ML ~~LOC~~ SOLN
10.0000 [IU] | Freq: Every day | SUBCUTANEOUS | Status: DC
  Administered 2016-12-07: 10 [IU] via SUBCUTANEOUS
  Filled 2016-12-07 (×3): qty 0.1

## 2016-12-07 MED ORDER — SODIUM CHLORIDE 0.9 % IV SOLN
INTRAVENOUS | Status: DC
  Administered 2016-12-07 – 2016-12-08 (×2): via INTRAVENOUS

## 2016-12-07 MED ORDER — IPRATROPIUM-ALBUTEROL 0.5-2.5 (3) MG/3ML IN SOLN
3.0000 mL | Freq: Three times a day (TID) | RESPIRATORY_TRACT | Status: DC
  Administered 2016-12-07 – 2016-12-08 (×4): 3 mL via RESPIRATORY_TRACT
  Filled 2016-12-07 (×5): qty 3

## 2016-12-07 MED ORDER — INSULIN ASPART 100 UNIT/ML ~~LOC~~ SOLN
0.0000 [IU] | Freq: Three times a day (TID) | SUBCUTANEOUS | Status: DC
Start: 2016-12-07 — End: 2016-12-08
  Administered 2016-12-07: 2 [IU] via SUBCUTANEOUS
  Administered 2016-12-07: 3 [IU] via SUBCUTANEOUS
  Administered 2016-12-08 (×2): 2 [IU] via SUBCUTANEOUS

## 2016-12-07 NOTE — Progress Notes (Signed)
[facility] PROGRESS NOTE  Joshua Cross [facility]:[id number] DOB: July 19, 1945 DOA: 12/06/2016   PCP: Janith Lima, MD     Assessment/Plan: Principal Problem:   CAP (community acquired pneumonia) Active Problems:   COPD with acute exacerbation ([place])   Acute respiratory failure with hypoxia ([place])   Hyperlipidemia   COPD exacerbation ([place])    71 y.o. male with medical history significant for chronic bronchitis and hyperlipidemia, and into the emergency department for evaluation of productive cough, dyspnea, and lightheadedness. CBC is notable for a leukocytosis to 12,400 and a polycythemia with hemoglobin 17.7. Troponin is undetectable and BNP is within normal limits. CTA PE study was obtained and negative for PE, but notable for severe emphysematous changes bilaterally and patchy consolidation in the bilateral posterior lower lobes suspicious for pneumonia. Patient was treated with 125 mg of IV Solu-Medrol and doxycycline in the ED. admitted to the medical surgical unit for ongoing evaluation and management of community-acquired pneumonia with acute exacerbation and COPD and acute hypoxic respiratory failure.  Assessment and plan 1. CAP, acute hypoxic respiratory failure  - Pt presents with SOB, wheezing, productive cough, all worsening over past few days despite completing cefdinir 2 days prior to admission  - He is found to have leukocytosis, hypoxia, basilar rhonchi, and CTA chest demonstrates bibasilar patchy consolidations concerning for PNA  - He was treated with doxycycline, systemic steroids, nebs, and supplemental O2 in ED  Continue Levaquin in setting of penicillin and azithromycin allergies, day #2  He states he feels better - Continue supplemental O2 prn    2. COPD with acute exacerbation  - Pt presents with wheezing and obstructive breathing pattern, likely precipitated by infection as above  - Treated in ED with 125 mg IV Solu-Medrol and albuterol nebs .  Continue Solu-Medrol 60 mg IV every 6 - Plan to continue Trelegy, Daliresp, star nebs,    3. Hyperlipidemia  - Continue Crestor   4. Hyperglycemia-Accu-Cheks 5 therefore will start sliding scale insulin.    DVT prophylaxsis Lovenox  Code Status:  Full code    Family Communication: Discussed in detail with the patient, all imaging results, lab results explained to the patient   Disposition Plan: 2-3 days     Consultants:  None  Procedures:  None  Antibiotics: Anti-infectives    Start     Dose/Rate Route Frequency Ordered Stop   12/06/16 2345  levofloxacin (LEVAQUIN) IVPB 750 mg     750 mg 100 mL/hr over 90 Minutes Intravenous Daily at bedtime 12/06/16 2325 12/11/16 2159   12/06/16 1845  doxycycline (VIBRA-TABS) tablet 100 mg     100 mg Oral  Once 12/06/16 1832 12/06/16 1912         HPI/Subjective: Subjectively feels better, less short of breath, does not wear oxygen at home, currently on 2 L   Objective: Vitals:   12/07/16 0020 12/07/16 0104 12/07/16 0540 12/07/16 0910  BP: 127/62  (!) 122/52   Pulse: 83  81   Resp: 16  16   Temp: 99.1 F (37.3 C)  98 F (36.7 C)   TempSrc: Oral  Oral   SpO2: 95% 95% 96% 96%  Weight:  60.4 kg (133 lb 2.5 oz)    Height:  5\' 7"  (1.702 m)      Intake/Output Summary (Last 24 hours) at 12/07/16 0927 Last data filed at 12/07/16 0908  Gross per 24 hour  Intake          1318.75  ml  Output             2000 ml  Net          -681.25 ml    Exam:  Examination:  General exam: Appears calm and comfortable  Respiratory systeSlight wheezingory effort normal. Cardiovascular system: S1 & S2 heard, RRR. No JVD, murmurs, rubs, gallops or clicks. No pedal edema. Gastrointestinal system: Abdomen is nondistended, soft and nontender. No organomegaly or masses felt. Normal bowel sounds heard. Central nervous system: Alert and oriented. No focal neurological deficits. Extremities: Symmetric 5 x 5 power. Skin: No rashes, lesions or  ulcers Psychiatry: Judgement and insight appear normal. Mood & affect appropriate.     Data Reviewed: I have personally reviewed following labs and imaging studies  Micro Results No results found for this or any previous visit (from the past 240 hour(s)).  Radiology Reports Ct Angio Chest Pe W And/or Wo Contrast  Result Date: 12/06/2016 CLINICAL DATA:  Shortness of breath and near syncope. EXAM: CT ANGIOGRAPHY CHEST WITH CONTRAST TECHNIQUE: Multidetector CT imaging of the chest was performed using the standard protocol during bolus administration of intravenous contrast. Multiplanar CT image reconstructions and MIPs were obtained to evaluate the vascular anatomy. CONTRAST:  100 mL Isovue 370 COMPARISON:  Chest x-ray June 07, 2016 FINDINGS: Cardiovascular: Satisfactory opacification of the pulmonary arteries to the segmental level. No evidence of pulmonary embolism. Normal heart size. No pericardial effusion. Mediastinum/Nodes: No enlarged mediastinal, hilar, or axillary lymph nodes. Thyroid gland, trachea, and esophagus demonstrate no significant findings. Lungs/Pleura: Severe emphysematous changes are identified throughout bilateral lungs. Patchy consolidations identified in the posterior bilateral lung bases. There is no pleural effusion or pulmonary edema. Upper Abdomen: No acute abnormality. Musculoskeletal: Degenerative joint changes of the spine are noted. Review of the MIP images confirms the above findings. IMPRESSION: No pulmonary embolus. Severe emphysematous changes of both lungs. Patchy consolidation of bilateral posterior lower lobes suspicious for pneumonia. Electronically Signed   By: Abelardo Diesel M.D.   On: 12/06/2016 20:25     CBC  Recent Labs Lab 12/06/16 1833 12/07/16 0455  WBC 12.4* 15.7*  HGB 17.7* 12.7*  HCT 50.7 36.4*  PLT 215 287  MCV 90.7 88.6  MCH 31.7 30.9  MCHC 34.9 34.9  RDW 13.4 13.3  LYMPHSABS 1.2 0.8  MONOABS 0.4 0.2  EOSABS 0.0 0.0  BASOSABS 0.0  0.0    Chemistries   Recent Labs Lab 12/06/16 1833 12/07/16 0455  NA 138 132*  K 3.8 4.0  CL 102 99*  CO2 23 22  GLUCOSE 127* 207*  BUN 8 10  CREATININE 0.77 0.87  CALCIUM 9.4 8.7*  AST 26  --   ALT 20  --   ALKPHOS 116  --   BILITOT 0.8  --    ------------------------------------------------------------------------------------------------------------------ estimated creatinine clearance is 66.5 mL/min (by C-G formula based on SCr of 0.87 mg/dL). ------------------------------------------------------------------------------------------------------------------ No results for input(s): HGBA1C in the last 72 hours. ------------------------------------------------------------------------------------------------------------------ No results for input(s): CHOL, HDL, LDLCALC, TRIG, CHOLHDL, LDLDIRECT in the last 72 hours. ------------------------------------------------------------------------------------------------------------------ No results for input(s): TSH, T4TOTAL, T3FREE, THYROIDAB in the last 72 hours.  Invalid input(s): FREET3 ------------------------------------------------------------------------------------------------------------------ No results for input(s): VITAMINB12, FOLATE, FERRITIN, TIBC, IRON, RETICCTPCT in the last 72 hours.  Coagulation profile No results for input(s): INR, PROTIME in the last 168 hours.  No results for input(s): DDIMER in the last 72 hours.  Cardiac Enzymes  Recent Labs Lab 12/06/16 1833  TROPONINI <0.03   ------------------------------------------------------------------------------------------------------------------ Invalid input(s): POCBNP  CBG: No results for input(s): GLUCAP in the last 168 hours.     Studies: Ct Angio Chest Pe W And/or Wo Contrast  Result Date: 12/06/2016 CLINICAL DATA:  Shortness of breath and near syncope. EXAM: CT ANGIOGRAPHY CHEST WITH CONTRAST TECHNIQUE: Multidetector CT imaging of the chest  was performed using the standard protocol during bolus administration of intravenous contrast. Multiplanar CT image reconstructions and MIPs were obtained to evaluate the vascular anatomy. CONTRAST:  100 mL Isovue 370 COMPARISON:  Chest x-ray June 07, 2016 FINDINGS: Cardiovascular: Satisfactory opacification of the pulmonary arteries to the segmental level. No evidence of pulmonary embolism. Normal heart size. No pericardial effusion. Mediastinum/Nodes: No enlarged mediastinal, hilar, or axillary lymph nodes. Thyroid gland, trachea, and esophagus demonstrate no significant findings. Lungs/Pleura: Severe emphysematous changes are identified throughout bilateral lungs. Patchy consolidations identified in the posterior bilateral lung bases. There is no pleural effusion or pulmonary edema. Upper Abdomen: No acute abnormality. Musculoskeletal: Degenerative joint changes of the spine are noted. Review of the MIP images confirms the above findings. IMPRESSION: No pulmonary embolus. Severe emphysematous changes of both lungs. Patchy consolidation of bilateral posterior lower lobes suspicious for pneumonia. Electronically Signed   By: Abelardo Diesel M.D.   On: 12/06/2016 20:25      No results found for: HGBA1C Lab Results  Component Value Date   CREATININE 0.87 12/07/2016       Scheduled Meds: . acidophilus  1 capsule Oral Daily  . aspirin EC  81 mg Oral Daily  . budesonide (PULMICORT) nebulizer solution  0.25 mg Nebulization BID  . enoxaparin (LOVENOX) injection  40 mg Subcutaneous QHS  . ipratropium-albuterol  3 mL Nebulization TID  . latanoprost  1 drop Both Eyes QHS  . magnesium oxide  400 mg Oral Daily  . methylPREDNISolone (SOLU-MEDROL) injection  60 mg Intravenous Q6H  . omega-3 acid ethyl esters  1 g Oral BID  . PARoxetine  20 mg Oral Daily  . roflumilast  250 mcg Oral Daily  . rosuvastatin  10 mg Oral q1800  . umeclidinium-vilanterol  1 puff Inhalation Daily   Continuous Infusions: .  sodium chloride 1,000 mL (12/07/16 0026)  . levofloxacin (LEVAQUIN) IV Stopped (12/07/16 0309)     LOS: 1 day    Time spent: >30 MINS    Reyne Dumas  [facility] Pager [phone]. If 7PM-7AM, please contact night-coverage at [email or web address], password [facility] 12/07/2016, 9:27 AM  LOS: 1 day

## 2016-12-08 ENCOUNTER — Encounter: Payer: Medicare Other | Admitting: Internal Medicine

## 2016-12-08 LAB — BASIC METABOLIC PANEL
ANION GAP: 7 (ref 5–15)
BUN: 11 mg/dL (ref 6–20)
CALCIUM: 8.7 mg/dL — AB (ref 8.9–10.3)
CO2: 23 mmol/L (ref 22–32)
Chloride: 109 mmol/L (ref 101–111)
Creatinine, Ser: 0.68 mg/dL (ref 0.61–1.24)
GLUCOSE: 135 mg/dL — AB (ref 65–99)
Potassium: 3.9 mmol/L (ref 3.5–5.1)
Sodium: 139 mmol/L (ref 135–145)

## 2016-12-08 LAB — GLUCOSE, CAPILLARY
GLUCOSE-CAPILLARY: 124 mg/dL — AB (ref 65–99)
GLUCOSE-CAPILLARY: 126 mg/dL — AB (ref 65–99)

## 2016-12-08 MED ORDER — ALBUTEROL SULFATE HFA 108 (90 BASE) MCG/ACT IN AERS: 2.0000 | INHALATION_SPRAY | Freq: Four times a day (QID) | RESPIRATORY_TRACT | 2 refills | Status: DC | PRN

## 2016-12-08 MED ORDER — PREDNISONE 50 MG PO TABS: 50.0000 mg | ORAL_TABLET | Freq: Every day | ORAL | 0 refills | Status: AC

## 2016-12-08 MED ORDER — LEVOFLOXACIN 750 MG PO TABS: 750.0000 mg | ORAL_TABLET | Freq: Every day | ORAL | 0 refills | Status: DC

## 2016-12-08 NOTE — Progress Notes (Signed)
    Durable Medical Equipment        Start     Ordered   12/08/16 1448  For home use only DME oxygen  Once    Question Answer Comment  Mode or (Route) Nasal cannula   Liters per Minute 4   Frequency Continuous (stationary and portable oxygen unit needed)   Oxygen conserving device Yes   Oxygen delivery system Gas      12/08/16 1447    [phone]

## 2016-12-08 NOTE — Progress Notes (Signed)
Pt got very short of breath while ambulating without oxygen.  O2 sat was 79, increased to 90% when oxygen was placed.

## 2016-12-08 NOTE — Discharge Summary (Signed)
Physician Discharge Summary  Joshua Cross MRN: [id number] DOB/AGE: 04-24-1945 71 y.o.  PCP: Janith Lima, MD   Admit date: 12/06/2016 Discharge date: 12/08/2016  Discharge Diagnoses:    Principal Problem:   CAP (community acquired pneumonia) Active Problems:   COPD with acute exacerbation ([place])   Acute respiratory failure with hypoxia ([place])   Hyperlipidemia   COPD exacerbation ([place])    Follow-up recommendations Follow-up with PCP in 3-5 days , including all  additional recommended appointments as below Follow-up CBC, CMP in 3-5 days       Current Discharge Medication List    START taking these medications   Details  !! albuterol (PROVENTIL HFA;VENTOLIN HFA) 108 (90 Base) MCG/ACT inhaler Inhale 2 puffs into the lungs every 6 (six) hours as needed for wheezing or shortness of breath. Qty: 1 Inhaler, Refills: 2    levofloxacin (LEVAQUIN) 750 MG tablet Take 1 tablet (750 mg total) by mouth daily. Qty: 7 tablet, Refills: 0    predniSONE (DELTASONE) 50 MG tablet Take 1 tablet (50 mg total) by mouth daily with breakfast. Qty: 5 tablet, Refills: 0     !! - Potential duplicate medications found. Please discuss with provider.    CONTINUE these medications which have NOT CHANGED   Details  aspirin EC 81 MG tablet Take 81 mg by mouth daily.    Coenzyme Q10 (CO Q 10 PO) Take 1 tablet by mouth daily.     Fluticasone-Umeclidin-Vilant (TRELEGY ELLIPTA) 100-62.5-25 MCG/INH AEPB Inhale 1 Act into the lungs daily. Qty: 28 each, Refills: 11   Associated Diagnoses: Mixed simple and mucopurulent chronic bronchitis (HCC)    LUMIGAN 0.01 % SOLN instill 1 drop into both eyes once daily at bedtime Refills: 0    magnesium gluconate (MAGONATE) 500 MG tablet Take 500 mg by mouth daily.    Melatonin 10 MG CAPS Take 1 capsule by mouth daily.     Misc Natural Products (TART CHERRY ADVANCED PO) Take 1 tablet by mouth daily.     Omega-3 Fatty Acids (FISH OIL) 1200 MG CAPS Take 1  capsule by mouth 2 (two) times daily.     !! OVER THE COUNTER MEDICATION Supports liver and lung    !! OVER THE COUNTER MEDICATION Take 1 tablet by mouth daily. Ashwagandha '500mg'$      PARoxetine (PAXIL) 20 MG tablet Take 20 mg by mouth daily.  Refills: 0    Potassium 99 MG TABS Take 1 tablet by mouth daily.     !! PROAIR HFA 108 (90 Base) MCG/ACT inhaler inhale 2 puffs by mouth every 4 hours Refills: 0    Probiotic Product (PROBIOTIC ADVANCED PO) Take 1 capsule by mouth daily. From deep roots     Roflumilast (DALIRESP) 250 MCG TABS Take 1 tablet by mouth daily. Qty: 30 tablet, Refills: 11   Associated Diagnoses: Mixed simple and mucopurulent chronic bronchitis (HCC)    rosuvastatin (CRESTOR) 10 MG tablet Take 10 mg by mouth daily.  Refills: 0    TURMERIC PO Take 1 tablet by mouth daily.      !! - Potential duplicate medications found. Please discuss with provider.    STOP taking these medications     cefdinir (OMNICEF) 300 MG capsule          Discharge Condition: Stable  Discharge Instructions Get Medicines reviewed and adjusted: Please take all your medications with you for your next visit with your Primary MD  Please request your Primary MD to go over all hospital  tests and procedure/radiological results at the follow up, please ask your Primary MD to get all Hospital records sent to his/her office.  If you experience worsening of your admission symptoms, develop shortness of breath, life threatening emergency, suicidal or homicidal thoughts you must seek medical attention immediately by calling 911 or calling your MD immediately if symptoms less severe.  You must read complete instructions/literature along with all the possible adverse reactions/side effects for all the Medicines you take and that have been prescribed to you. Take any new Medicines after you have completely understood and accpet all the possible adverse reactions/side effects.   Do not drive when  taking Pain medications.   Do not take more than prescribed Pain, Sleep and Anxiety Medications  Special Instructions: If you have smoked or chewed Tobacco in the last 2 yrs please stop smoking, stop any regular Alcohol and or any Recreational drug use.  Wear Seat belts while driving.  Please note  You were cared for by a hospitalist during your hospital stay. Once you are discharged, your primary care physician will handle any further medical issues. Please note that NO REFILLS for any discharge medications will be authorized once you are discharged, as it is imperative that you return to your primary care physician (or establish a relationship with a primary care physician if you do not have one) for your aftercare needs so that they can reassess your need for medications and monitor your lab values.     Allergies  Allergen Reactions  . Erythromycin     REACTION: GI Intolerance  . Penicillins     Has patient had a PCN reaction causing immediate rash, facial/tongue/throat swelling, SOB or lightheadedness with hypotension: Unknown Has patient had a PCN reaction causing severe rash involving mucus membranes or skin necrosis: Unknown Has patient had a PCN reaction that required hospitalization: Unknown Has patient had a PCN reaction occurring within the last 10 years: Unknown If all of the above answers are "NO", then may proceed with Cephalosporin use.       Disposition: Home   Consults:  None    Significant Diagnostic Studies:  Ct Angio Chest Pe W And/or Wo Contrast  Result Date: 12/06/2016 CLINICAL DATA:  Shortness of breath and near syncope. EXAM: CT ANGIOGRAPHY CHEST WITH CONTRAST TECHNIQUE: Multidetector CT imaging of the chest was performed using the standard protocol during bolus administration of intravenous contrast. Multiplanar CT image reconstructions and MIPs were obtained to evaluate the vascular anatomy. CONTRAST:  100 mL Isovue 370 COMPARISON:  Chest x-ray  June 07, 2016 FINDINGS: Cardiovascular: Satisfactory opacification of the pulmonary arteries to the segmental level. No evidence of pulmonary embolism. Normal heart size. No pericardial effusion. Mediastinum/Nodes: No enlarged mediastinal, hilar, or axillary lymph nodes. Thyroid gland, trachea, and esophagus demonstrate no significant findings. Lungs/Pleura: Severe emphysematous changes are identified throughout bilateral lungs. Patchy consolidations identified in the posterior bilateral lung bases. There is no pleural effusion or pulmonary edema. Upper Abdomen: No acute abnormality. Musculoskeletal: Degenerative joint changes of the spine are noted. Review of the MIP images confirms the above findings. IMPRESSION: No pulmonary embolus. Severe emphysematous changes of both lungs. Patchy consolidation of bilateral posterior lower lobes suspicious for pneumonia. Electronically Signed   By: Abelardo Diesel M.D.   On: 12/06/2016 20:25        Filed Weights   12/07/16 0104  Weight: 60.4 kg (133 lb 2.5 oz)     Microbiology: Recent Results (from the past 240 hour(s))  Culture, sputum-assessment  Status: None   Collection Time: 12/07/16  1:05 PM  Result Value Ref Range Status   Specimen Description EXPECTORATED SPUTUM  Final   Special Requests NONE  Final   Sputum evaluation   Final    Sputum specimen not acceptable for testing.  Please recollect.   NOTIFIED T.THURMAN RN [phone] A.QUIZON    Report Status 12/07/2016 FINAL  Final       Blood Culture    Component Value Date/Time   SDES EXPECTORATED SPUTUM 12/07/2016 1305   SPECREQUEST NONE 12/07/2016 1305   REPTSTATUS 12/07/2016 FINAL 12/07/2016 1305      Labs: Results for orders placed or performed during the hospital encounter of 12/06/16 (from the past 48 hour(s))  CBC with Differential     Status: Abnormal   Collection Time: 12/06/16  6:33 PM  Result Value Ref Range   WBC 12.4 (H) 4.0 - 10.5 K/uL   RBC 5.59 4.22 - 5.81  MIL/uL   Hemoglobin 17.7 (H) 13.0 - 17.0 g/dL   HCT 50.7 39.0 - 52.0 %   MCV 90.7 78.0 - 100.0 fL   MCH 31.7 26.0 - 34.0 pg   MCHC 34.9 30.0 - 36.0 g/dL   RDW 13.4 11.5 - 15.5 %   Platelets 215 150 - 400 K/uL   Neutrophils Relative % 87 %   Neutro Abs 10.8 (H) 1.7 - 7.7 K/uL   Lymphocytes Relative 9 %   Lymphs Abs 1.2 0.7 - 4.0 K/uL   Monocytes Relative 4 %   Monocytes Absolute 0.4 0.1 - 1.0 K/uL   Eosinophils Relative 0 %   Eosinophils Absolute 0.0 0.0 - 0.7 K/uL   Basophils Relative 0 %   Basophils Absolute 0.0 0.0 - 0.1 K/uL  Comprehensive metabolic panel     Status: Abnormal   Collection Time: 12/06/16  6:33 PM  Result Value Ref Range   Sodium 138 135 - 145 mmol/L   Potassium 3.8 3.5 - 5.1 mmol/L   Chloride 102 101 - 111 mmol/L   CO2 23 22 - 32 mmol/L   Glucose, Bld 127 (H) 65 - 99 mg/dL   BUN 8 6 - 20 mg/dL   Creatinine, Ser 0.77 0.61 - 1.24 mg/dL   Calcium 9.4 8.9 - 10.3 mg/dL   Total Protein 7.8 6.5 - 8.1 g/dL   Albumin 4.4 3.5 - 5.0 g/dL   AST 26 15 - 41 U/L   ALT 20 17 - 63 U/L   Alkaline Phosphatase 116 38 - 126 U/L   Total Bilirubin 0.8 0.3 - 1.2 mg/dL   GFR calc non Af Amer >60 >60 mL/min   GFR calc Af Amer >60 >60 mL/min    Comment: (NOTE) The eGFR has been calculated using the CKD EPI equation. This calculation has not been validated in all clinical situations. eGFR's persistently <60 mL/min signify possible Chronic Kidney Disease.    Anion gap 13 5 - 15  Troponin I     Status: None   Collection Time: 12/06/16  6:33 PM  Result Value Ref Range   Troponin I <0.03 <0.03 ng/mL  Brain natriuretic peptide     Status: None   Collection Time: 12/06/16  6:33 PM  Result Value Ref Range   B Natriuretic Peptide 53.0 0.0 - 100.0 pg/mL  Strep pneumoniae urinary antigen     Status: None   Collection Time: 12/06/16 11:22 PM  Result Value Ref Range   Strep Pneumo Urinary Antigen NEGATIVE NEGATIVE    Comment:  Infection due to S. pneumoniae cannot be  absolutely ruled out since the antigen present may be below the detection limit of the test.   HIV antibody     Status: None   Collection Time: 12/07/16  4:55 AM  Result Value Ref Range   HIV Screen 4th Generation wRfx Non Reactive Non Reactive    Comment: (NOTE) Performed At: [facility] [place], [place] [id number] Lindon Romp MD [id number]   Basic metabolic panel     Status: Abnormal   Collection Time: 12/07/16  4:55 AM  Result Value Ref Range   Sodium 132 (L) 135 - 145 mmol/L   Potassium 4.0 3.5 - 5.1 mmol/L   Chloride 99 (L) 101 - 111 mmol/L   CO2 22 22 - 32 mmol/L   Glucose, Bld 207 (H) 65 - 99 mg/dL   BUN 10 6 - 20 mg/dL   Creatinine, Ser 0.87 0.61 - 1.24 mg/dL   Calcium 8.7 (L) 8.9 - 10.3 mg/dL   GFR calc non Af Amer >60 >60 mL/min   GFR calc Af Amer >60 >60 mL/min    Comment: (NOTE) The eGFR has been calculated using the CKD EPI equation. This calculation has not been validated in all clinical situations. eGFR's persistently <60 mL/min signify possible Chronic Kidney Disease.    Anion gap 11 5 - 15  CBC WITH DIFFERENTIAL     Status: Abnormal   Collection Time: 12/07/16  4:55 AM  Result Value Ref Range   WBC 15.7 (H) 4.0 - 10.5 K/uL   RBC 4.11 (L) 4.22 - 5.81 MIL/uL   Hemoglobin 12.7 (L) 13.0 - 17.0 g/dL    Comment: DELTA CHECK NOTED REPEATED TO VERIFY    HCT 36.4 (L) 39.0 - 52.0 %   MCV 88.6 78.0 - 100.0 fL   MCH 30.9 26.0 - 34.0 pg   MCHC 34.9 30.0 - 36.0 g/dL   RDW 13.3 11.5 - 15.5 %   Platelets 287 150 - 400 K/uL   Neutrophils Relative % 94 %   Neutro Abs 14.7 (H) 1.7 - 7.7 K/uL   Lymphocytes Relative 5 %   Lymphs Abs 0.8 0.7 - 4.0 K/uL   Monocytes Relative 1 %   Monocytes Absolute 0.2 0.1 - 1.0 K/uL   Eosinophils Relative 0 %   Eosinophils Absolute 0.0 0.0 - 0.7 K/uL   Basophils Relative 0 %   Basophils Absolute 0.0 0.0 - 0.1 K/uL  Hemoglobin A1c     Status: Abnormal   Collection Time: 12/07/16  4:55 AM  Result  Value Ref Range   Hgb A1c MFr Bld 5.9 (H) 4.8 - 5.6 %    Comment: (NOTE) Pre diabetes:          5.7%-6.4% Diabetes:              >6.4% Glycemic control for   <7.0% adults with diabetes    Mean Plasma Glucose 122.63 mg/dL    Comment: Performed at [facility], [address], [place], [place] [address]  Glucose, capillary     Status: Abnormal   Collection Time: 12/07/16 11:39 AM  Result Value Ref Range   Glucose-Capillary 185 (H) 65 - 99 mg/dL  Culture, sputum-assessment     Status: None   Collection Time: 12/07/16  1:05 PM  Result Value Ref Range   Specimen Description EXPECTORATED SPUTUM    Special Requests NONE    Sputum evaluation      Sputum specimen not  acceptable for testing.  Please recollect.   NOTIFIED T.THURMAN RN [phone] A.QUIZON    Report Status 12/07/2016 FINAL   Glucose, capillary     Status: Abnormal   Collection Time: 12/07/16  4:55 PM  Result Value Ref Range   Glucose-Capillary 149 (H) 65 - 99 mg/dL  Glucose, capillary     Status: Abnormal   Collection Time: 12/07/16 10:12 PM  Result Value Ref Range   Glucose-Capillary 146 (H) 65 - 99 mg/dL  Basic metabolic panel     Status: Abnormal   Collection Time: 12/08/16  5:02 AM  Result Value Ref Range   Sodium 139 135 - 145 mmol/L    Comment: DELTA CHECK NOTED   Potassium 3.9 3.5 - 5.1 mmol/L   Chloride 109 101 - 111 mmol/L   CO2 23 22 - 32 mmol/L   Glucose, Bld 135 (H) 65 - 99 mg/dL   BUN 11 6 - 20 mg/dL   Creatinine, Ser 0.68 0.61 - 1.24 mg/dL   Calcium 8.7 (L) 8.9 - 10.3 mg/dL   GFR calc non Af Amer >60 >60 mL/min   GFR calc Af Amer >60 >60 mL/min    Comment: (NOTE) The eGFR has been calculated using the CKD EPI equation. This calculation has not been validated in all clinical situations. eGFR's persistently <60 mL/min signify possible Chronic Kidney Disease.    Anion gap 7 5 - 15  Glucose, capillary     Status: Abnormal   Collection Time: 12/08/16  7:58 AM  Result Value Ref Range    Glucose-Capillary 124 (H) 65 - 99 mg/dL  Glucose, capillary     Status: Abnormal   Collection Time: 12/08/16 11:35 AM  Result Value Ref Range   Glucose-Capillary 126 (H) 65 - 99 mg/dL     Lipid Panel  No results found for: CHOL, TRIG, HDL, CHOLHDL, VLDL, LDLCALC, LDLDIRECT   Lab Results  Component Value Date   HGBA1C 5.9 (H) 12/07/2016     Lab Results  Component Value Date   CREATININE 0.68 12/08/2016     HPI :   71 y.o. male with medical history significant for chronic bronchitis and hyperlipidemia, and into the emergency department for evaluation of productive cough, dyspnea, and lightheadedness. Patient reports that he has had these symptoms recurring over the past couple months, transiently improving with courses of antibiotics, and just completed cefdinir 2 days ago. He reports some initial improvement in his symptoms when he first started this most recent course of antibiotics, but then began to worsen again just prior to completing the course. He denies fevers or chills and denies chest pain or palpitations, but notes dyspnea at rest, worse with exertion, and cough productive of thick white sputum. Denies lower extremity swelling or tenderness, and denies orthopnea.   ED Course: Upon arrival to the ED, patient is found to be afebrile, saturating 82% on room air, tachypneic, slightly tachycardic, and with stable blood pressure. EKG features a sinus tachycardia with rate 102. Chemistry panels unremarkable. CBC is notable for a leukocytosis to 12,400 and a polycythemia with hemoglobin 17.7. Troponin is undetectable and BNP is within normal limits. CTA PE study was obtained and negative for PE, but notable for severe emphysematous changes bilaterally and patchy consolidation in the bilateral posterior lower lobes suspicious for pneumonia. Patient was treated with 125 mg of IV Solu-Medrol and doxycycline in the ED. Is also treated with an albuterol neb. He has been hemodynamically  stable in the ED, but continues to  have a new oxygen requirement and is dyspneic at rest. He will be admitted to the medical surgical unit for ongoing evaluation and management of community-acquired pneumonia with acute exacerbation and COPD and acute hypoxic respiratory failure  HOSPITAL COURSE: *  1. CAP, acute hypoxic respiratory failure  - Pt  presented with   SOB, wheezing, productive cough, all worsening over past few days despite completing cefdinir 2 days prior to admission  - He is found to have leukocytosis, hypoxia, basilar rhonchi, and CTA chest demonstrates bibasilar patchy consolidations concerning for PNA  - He was treated with levofloxacin, systemic steroids, nebs, and supplemental O2 in ED  Continue Levaquin in setting of penicillin and azithromycin allergies, for another 7 days He states he feels better Checked for oxygen requirements prior to discharge  2. COPD with acute exacerbation  - Pt presents with wheezing and obstructive breathing pattern, likely precipitated by infection as above  - Treated in ED with 125 mg IV Solu-Medrol and albuterol nebs . Followed up with Solu-Medrol 60 mg IV every 6. Now being discharged on prednisone 50 mg daily 5 days - Plan to continue Trelegy, Daliresp, added albuterol to home regimen    3. Hyperlipidemia  - Continue Crestor   4. Hyperglycemia-likely related to steroids, stable Accu-Cheks  5. Hyponatremia-resolved could be secondary to dehydration     Discharge Exam:  Blood pressure 137/66, pulse 71, temperature 98.2 F (36.8 C), temperature source Oral, resp. rate 18, height '5\' 7"'$  (1.702 m), weight 60.4 kg (133 lb 2.5 oz), SpO2 95 %. Cardiovascular system: S1 & S2 heard, RRR. No JVD, murmurs, rubs, gallops or clicks. No pedal edema. Gastrointestinal system: Abdomen is nondistended, soft and nontender. No organomegaly or masses felt. Normal bowel sounds heard. Central nervous system: Alert and oriented. No focal neurological  deficits. Extremities: Symmetric 5 x 5 power. Skin: No rashes, lesions or ulcers Psychiatry: Judgement and insight appear normal. Mood & affect appropriate.      Follow-up Information    Janith Lima, MD. Call.   Specialty:  Internal Medicine Why:  Hospital follow-up in 3-5 days Contact information: [address] [address] [phone]           Signed: Reyne Dumas 12/08/2016, 12:47 PM        Time spent >1 hour

## 2016-12-08 NOTE — Progress Notes (Signed)
Pt alert, oriented, and ambulatory.  D/C instructions given, oxygen supplied before discharge.  All questions answered.

## 2016-12-08 NOTE — Progress Notes (Signed)
SATURATION QUALIFICATIONS: (This note is used to comply with regulatory documentation for home oxygen)  Patient Saturations on Room Air at Rest = 93%  Patient Saturations on Room Air while Ambulating = 79%  Patient Saturations on 4 Liters of oxygen while Ambulating = 91%  Please briefly explain why patient needs home oxygen: Albuterol and inhalers, methods tried and failed, pt still requires oxygen

## 2016-12-09 LAB — LEGIONELLA PNEUMOPHILA SEROGP 1 UR AG: L. pneumophila Serogp 1 Ur Ag: NEGATIVE

## 2016-12-12 ENCOUNTER — Encounter: Payer: Self-pay | Admitting: Internal Medicine

## 2016-12-15 ENCOUNTER — Ambulatory Visit (INDEPENDENT_AMBULATORY_CARE_PROVIDER_SITE_OTHER): Payer: Medicare Other | Admitting: Internal Medicine

## 2016-12-15 ENCOUNTER — Encounter: Payer: Self-pay | Admitting: Internal Medicine

## 2016-12-15 ENCOUNTER — Ambulatory Visit (INDEPENDENT_AMBULATORY_CARE_PROVIDER_SITE_OTHER)
Admission: RE | Admit: 2016-12-15 | Discharge: 2016-12-15 | Disposition: A | Discharge status: Home / Self Care | Payer: Medicare Other | Source: Ambulatory Visit | Attending: Internal Medicine | Admitting: Internal Medicine

## 2016-12-15 ENCOUNTER — Ambulatory Visit: Payer: Self-pay | Admitting: Internal Medicine

## 2016-12-15 VITALS — BP 108/68 | HR 90 | Temp 97.9°F | Resp 20 | Ht 67.0 in | Wt 133.8 lb

## 2016-12-15 DIAGNOSIS — J189 Pneumonia, unspecified organism: Secondary | ICD-10-CM

## 2016-12-15 DIAGNOSIS — J418 Mixed simple and mucopurulent chronic bronchitis: Secondary | ICD-10-CM

## 2016-12-15 NOTE — Telephone Encounter (Signed)
PA was approved. Will you call and let the patient know.

## 2016-12-15 NOTE — Progress Notes (Signed)
Subjective:  Patient ID: Joshua Cross, male    DOB: 01/04/1946  Age: 71 y.o. MRN: [id number]  CC: Hospitalization Follow-up   HPI HANZEL PIZZO presents for f/up after a recent admission for respiratory failure, COPD exacerbation, and bilateral lower lobe pneumonia. He has improved significantly with discharge. He has a mild nonproductive cough and wheezing but has had no episodes of chest pain, fever, chills, or night sweats. He has completed the antibiotics and the systemic steroids.  Outpatient Medications Prior to Visit  Medication Sig Dispense Refill  . albuterol (PROVENTIL HFA;VENTOLIN HFA) 108 (90 Base) MCG/ACT inhaler Inhale 2 puffs into the lungs every 6 (six) hours as needed for wheezing or shortness of breath. 1 Inhaler 2  . aspirin EC 81 MG tablet Take 81 mg by mouth daily.    . Fluticasone-Umeclidin-Vilant (TRELEGY ELLIPTA) 100-62.5-25 MCG/INH AEPB Inhale 1 Act into the lungs daily. 28 each 11  . LUMIGAN 0.01 % SOLN Instill 1 drop into both eyes once daily at bedtime  0  . magnesium gluconate (MAGONATE) 500 MG tablet Take 500 mg by mouth daily.    . Melatonin 10 MG CAPS Take 10 mg by mouth daily.     . Misc Natural Products (TART CHERRY ADVANCED PO) Take 1 tablet by mouth daily.     . Omega-3 Fatty Acids (FISH OIL) 1200 MG CAPS Take 1,200 mg by mouth 2 (two) times daily.     Marland Kitchen OVER THE COUNTER MEDICATION Supports liver and lung    . OVER THE COUNTER MEDICATION Take 1 tablet by mouth daily. Ashwagandha 500mg      . PARoxetine (PAXIL) 20 MG tablet Take 20 mg by mouth daily.   0  . Potassium 99 MG TABS Take 1 tablet by mouth daily.     Marland Kitchen PROAIR HFA 108 (90 Base) MCG/ACT inhaler inhale 2 puffs by mouth every 4 hours  0  . Probiotic Product (PROBIOTIC ADVANCED PO) Take 1 capsule by mouth daily. From deep roots     . Roflumilast (DALIRESP) 250 MCG TABS Take 1 tablet by mouth daily. 30 tablet 11  . rosuvastatin (CRESTOR) 10 MG tablet Take 10 mg by mouth daily.   0  .  TURMERIC PO Take 1 tablet by mouth daily.     . Coenzyme Q10 (CO Q 10 PO) Take 1 tablet by mouth daily.     Marland Kitchen levofloxacin (LEVAQUIN) 750 MG tablet Take 1 tablet (750 mg total) by mouth daily. 7 tablet 0   No facility-administered medications prior to visit.     ROS Review of Systems  Constitutional: Negative.  Negative for appetite change, chills, diaphoresis, fatigue, fever and unexpected weight change.  HENT: Negative.  Negative for facial swelling, sinus pressure, sore throat and trouble swallowing.   Eyes: Negative.   Respiratory: Positive for cough and wheezing. Negative for chest tightness and shortness of breath.   Cardiovascular: Negative.  Negative for chest pain, palpitations and leg swelling.  Gastrointestinal: Negative for abdominal pain, constipation, diarrhea, nausea and vomiting.  Endocrine: Negative.   Genitourinary: Negative for difficulty urinating.  Musculoskeletal: Negative.  Negative for back pain, myalgias and neck pain.  Skin: Negative.  Negative for color change and rash.  Allergic/Immunologic: Negative.   Neurological: Negative.  Negative for dizziness and weakness.  Hematological: Negative for adenopathy. Does not bruise/bleed easily.  Psychiatric/Behavioral: Negative.     Objective:  BP 108/68   Pulse 90   Temp 97.9 F (36.6 C) (Oral)   Resp 20  Ht 5\' 7"  (1.702 m)   Wt 133 lb 12.8 oz (60.7 kg)   SpO2 92%   BMI 20.96 kg/m   BP Readings from Last 3 Encounters:  12/15/16 108/68  12/08/16 (!) 141/56  06/07/16 110/68    Wt Readings from Last 3 Encounters:  12/15/16 133 lb 12.8 oz (60.7 kg)  12/07/16 133 lb 2.5 oz (60.4 kg)  11/24/16 133 lb 3.2 oz (60.4 kg)    Physical Exam  Constitutional: He is oriented to person, place, and time.  Non-toxic appearance. He does not have a sickly appearance. He does not appear ill. No distress.  HENT:  Mouth/Throat: Oropharynx is clear and moist. No oropharyngeal exudate.  Eyes: Conjunctivae are normal.  Right eye exhibits no discharge. Left eye exhibits no discharge. No scleral icterus.  Neck: Normal range of motion. Neck supple. No JVD present. No thyromegaly present.  Cardiovascular: Normal rate, regular rhythm and intact distal pulses.  Exam reveals no gallop and no friction rub.   No murmur heard. Pulmonary/Chest: Effort normal. No accessory muscle usage. No tachypnea. No respiratory distress. He has no decreased breath sounds. He has wheezes in the left lower field. He has rhonchi in the right lower field and the left lower field. He has no rales.  Abdominal: Soft. Bowel sounds are normal. He exhibits no distension and no mass. There is no tenderness. There is no rebound and no guarding.  Musculoskeletal: Normal range of motion. He exhibits no edema, tenderness or deformity.  Lymphadenopathy:    He has no cervical adenopathy.  Neurological: He is alert and oriented to person, place, and time.  Skin: Skin is warm and dry. No rash noted. He is not diaphoretic. No erythema. No pallor.  Vitals reviewed.   Lab Results  Component Value Date   WBC 15.7 (H) 12/07/2016   HGB 12.7 (L) 12/07/2016   HCT 36.4 (L) 12/07/2016   PLT 287 12/07/2016   GLUCOSE 135 (H) 12/08/2016   ALT 20 12/06/2016   AST 26 12/06/2016   NA 139 12/08/2016   K 3.9 12/08/2016   CL 109 12/08/2016   CREATININE 0.68 12/08/2016   BUN 11 12/08/2016   CO2 23 12/08/2016   HGBA1C 5.9 (H) 12/07/2016    Ct Angio Chest Pe W And/or Wo Contrast  Result Date: 12/06/2016 CLINICAL DATA:  Shortness of breath and near syncope. EXAM: CT ANGIOGRAPHY CHEST WITH CONTRAST TECHNIQUE: Multidetector CT imaging of the chest was performed using the standard protocol during bolus administration of intravenous contrast. Multiplanar CT image reconstructions and MIPs were obtained to evaluate the vascular anatomy. CONTRAST:  100 mL Isovue 370 COMPARISON:  Chest x-ray June 07, 2016 FINDINGS: Cardiovascular: Satisfactory opacification of the  pulmonary arteries to the segmental level. No evidence of pulmonary embolism. Normal heart size. No pericardial effusion. Mediastinum/Nodes: No enlarged mediastinal, hilar, or axillary lymph nodes. Thyroid gland, trachea, and esophagus demonstrate no significant findings. Lungs/Pleura: Severe emphysematous changes are identified throughout bilateral lungs. Patchy consolidations identified in the posterior bilateral lung bases. There is no pleural effusion or pulmonary edema. Upper Abdomen: No acute abnormality. Musculoskeletal: Degenerative joint changes of the spine are noted. Review of the MIP images confirms the above findings. IMPRESSION: No pulmonary embolus. Severe emphysematous changes of both lungs. Patchy consolidation of bilateral posterior lower lobes suspicious for pneumonia. Electronically Signed   By: Abelardo Diesel M.D.   On: 12/06/2016 20:25    Assessment & Plan:   Dacen was seen today for hospitalization follow-up.  Diagnoses and  all orders for this visit:  Community acquired pneumonia, unspecified laterality- based on his sx's, exam, and CXR this has resolved. He will let me know if he develops any new or worsening sx's -     DG Chest 2 View; Future  Mixed simple and mucopurulent chronic bronchitis ([place])- improvement noted after recent exacerbation, will cont. triple therapy and daliresp   I have discontinued Mr. Vigeant's Coenzyme Q10 (CO Q 10 PO) and levofloxacin. I am also having him maintain his rosuvastatin, PARoxetine, LUMIGAN, PROAIR HFA, Fluticasone-Umeclidin-Vilant, Probiotic Product (PROBIOTIC ADVANCED PO), magnesium gluconate, Potassium, TURMERIC PO, Misc Natural Products (TART CHERRY ADVANCED PO), Fish Oil, aspirin EC, OVER THE COUNTER MEDICATION, OVER THE COUNTER MEDICATION, Melatonin, Roflumilast, and albuterol.  No orders of the defined types were placed in this encounter.    Follow-up: Return in about 4 weeks (around 01/12/2017).  Scarlette Calico, MD

## 2016-12-15 NOTE — Patient Instructions (Signed)

## 2016-12-19 NOTE — Telephone Encounter (Signed)
Spoke with pt to let him know.

## 2017-01-02 ENCOUNTER — Encounter: Payer: Self-pay | Admitting: Internal Medicine

## 2017-01-02 ENCOUNTER — Ambulatory Visit (INDEPENDENT_AMBULATORY_CARE_PROVIDER_SITE_OTHER)
Admission: RE | Admit: 2017-01-02 | Discharge: 2017-01-02 | Disposition: A | Discharge status: Home / Self Care | Payer: Medicare Other | Source: Ambulatory Visit | Attending: Internal Medicine | Admitting: Internal Medicine

## 2017-01-02 ENCOUNTER — Ambulatory Visit (INDEPENDENT_AMBULATORY_CARE_PROVIDER_SITE_OTHER): Payer: Medicare Other | Admitting: Internal Medicine

## 2017-01-02 VITALS — BP 100/60 | HR 72 | Temp 97.4°F | Resp 20 | Ht 67.0 in | Wt 131.8 lb

## 2017-01-02 DIAGNOSIS — J301 Allergic rhinitis due to pollen: Secondary | ICD-10-CM

## 2017-01-02 DIAGNOSIS — J418 Mixed simple and mucopurulent chronic bronchitis: Secondary | ICD-10-CM

## 2017-01-02 DIAGNOSIS — J189 Pneumonia, unspecified organism: Secondary | ICD-10-CM

## 2017-01-02 MED ORDER — IPRATROPIUM BROMIDE 0.03 % NA SOLN: 2.0000 | Freq: Two times a day (BID) | NASAL | 11 refills | Status: DC

## 2017-01-02 MED ORDER — METHYLPREDNISOLONE 4 MG PO TBPK: ORAL_TABLET | ORAL | 0 refills | Status: DC

## 2017-01-02 NOTE — Patient Instructions (Signed)
Cough, Adult Coughing is a reflex that clears your throat and your airways. Coughing helps to heal and protect your lungs. It is normal to cough occasionally, but a cough that happens with other symptoms or lasts a long time may be a sign of a condition that needs treatment. A cough may last only 2-3 weeks (acute), or it may last longer than 8 weeks (chronic). What are the causes? Coughing is commonly caused by:  Breathing in substances that irritate your lungs.  A viral or bacterial respiratory infection.  Allergies.  Asthma.  Postnasal drip.  Smoking.  Acid backing up from the stomach into the esophagus (gastroesophageal reflux).  Certain medicines.  Chronic lung problems, including COPD (or rarely, lung cancer).  Other medical conditions such as heart failure.  Follow these instructions at home: Pay attention to any changes in your symptoms. Take these actions to help with your discomfort:  Take medicines only as told by your health care provider. ? If you were prescribed an antibiotic medicine, take it as told by your health care provider. Do not stop taking the antibiotic even if you start to feel better. ? Talk with your health care provider before you take a cough suppressant medicine.  Drink enough fluid to keep your urine clear or pale yellow.  If the air is dry, use a cold steam vaporizer or humidifier in your bedroom or your home to help loosen secretions.  Avoid anything that causes you to cough at work or at home.  If your cough is worse at night, try sleeping in a semi-upright position.  Avoid cigarette smoke. If you smoke, quit smoking. If you need help quitting, ask your health care provider.  Avoid caffeine.  Avoid alcohol.  Rest as needed.  Contact a health care provider if:  You have new symptoms.  You cough up pus.  Your cough does not get better after 2-3 weeks, or your cough gets worse.  You cannot control your cough with suppressant  medicines and you are losing sleep.  You develop pain that is getting worse or pain that is not controlled with pain medicines.  You have a fever.  You have unexplained weight loss.  You have night sweats. Get help right away if:  You cough up blood.  You have difficulty breathing.  Your heartbeat is very fast. This information is not intended to replace advice given to you by your health care provider. Make sure you discuss any questions you have with your health care provider. Document Released: 09/24/2010 Document Revised: 09/03/2015 Document Reviewed: 06/04/2014 Elsevier Interactive Patient Education  2017 Elsevier Inc.  

## 2017-01-02 NOTE — Progress Notes (Signed)
Subjective:  Patient ID: Joshua Cross, male    DOB: [birth year]  Age: 71 y.o. MRN: [id number]  CC: Cough   HPI Joshua Cross presents for a several day hx of worsening cough productive of clear phlegm with shortness of breath and wheezing. He also complains of postnasal drip and runny nose.  Outpatient Medications Prior to Visit  Medication Sig Dispense Refill  . albuterol (PROVENTIL HFA;VENTOLIN HFA) 108 (90 Base) MCG/ACT inhaler Inhale 2 puffs into the lungs every 6 (six) hours as needed for wheezing or shortness of breath. 1 Inhaler 2  . aspirin EC 81 MG tablet Take 81 mg by mouth daily.    . Fluticasone-Umeclidin-Vilant (TRELEGY ELLIPTA) 100-62.5-25 MCG/INH AEPB Inhale 1 Act into the lungs daily. 28 each 11  . LUMIGAN 0.01 % SOLN Instill 1 drop into both eyes once daily at bedtime  0  . magnesium gluconate (MAGONATE) 500 MG tablet Take 500 mg by mouth daily.    . Melatonin 10 MG CAPS Take 10 mg by mouth daily.     . Misc Natural Products (TART CHERRY ADVANCED PO) Take 1 tablet by mouth daily.     . Omega-3 Fatty Acids (FISH OIL) 1200 MG CAPS Take 1,200 mg by mouth 2 (two) times daily.     Marland Kitchen OVER THE COUNTER MEDICATION Supports liver and lung    . OVER THE COUNTER MEDICATION Take 1 tablet by mouth daily. Ashwagandha 500mg      . PARoxetine (PAXIL) 20 MG tablet Take 20 mg by mouth daily.   0  . Potassium 99 MG TABS Take 1 tablet by mouth daily.     Marland Kitchen PROAIR HFA 108 (90 Base) MCG/ACT inhaler inhale 2 puffs by mouth every 4 hours  0  . Probiotic Product (PROBIOTIC ADVANCED PO) Take 1 capsule by mouth daily. From deep roots     . Roflumilast (DALIRESP) 250 MCG TABS Take 1 tablet by mouth daily. 30 tablet 11  . rosuvastatin (CRESTOR) 10 MG tablet Take 10 mg by mouth daily.   0  . TURMERIC PO Take 1 tablet by mouth daily.      No facility-administered medications prior to visit.     ROS Review of Systems  Constitutional: Negative for chills, diaphoresis, fatigue and fever.    HENT: Positive for congestion, postnasal drip and rhinorrhea. Negative for facial swelling, nosebleeds, sinus pain, sinus pressure, sore throat and trouble swallowing.   Eyes: Negative.  Negative for visual disturbance.  Respiratory: Positive for cough, shortness of breath and wheezing. Negative for choking, chest tightness and stridor.   Cardiovascular: Negative.  Negative for chest pain, palpitations and leg swelling.  Gastrointestinal: Negative for abdominal pain, constipation, diarrhea, nausea and vomiting.  Genitourinary: Negative.  Negative for difficulty urinating.  Musculoskeletal: Negative.   Skin: Negative.   Allergic/Immunologic: Negative.   Neurological: Negative.  Negative for dizziness.  Hematological: Negative for adenopathy. Does not bruise/bleed easily.  Psychiatric/Behavioral: Negative.     Objective:  BP 100/60 (BP Location: Left Arm, Patient Position: Sitting, Cuff Size: Normal)   Pulse 72   Temp (!) 97.4 F (36.3 C) (Oral)   Resp 20   Ht 5\' 7"  (1.702 m)   Wt 131 lb 12 oz (59.8 kg)   SpO2 95%   BMI 20.63 kg/m   BP Readings from Last 3 Encounters:  01/02/17 100/60  12/15/16 108/68  12/08/16 (!) 141/56    Wt Readings from Last 3 Encounters:  01/02/17 131 lb 12 oz (59.8 kg)  12/15/16 133 lb 12.8 oz (60.7 kg)  12/07/16 133 lb 2.5 oz (60.4 kg)    Physical Exam  Constitutional: He is oriented to person, place, and time.  Non-toxic appearance. He does not have a sickly appearance. He does not appear ill. No distress.  HENT:  Nose: Mucosal edema and rhinorrhea present. No sinus tenderness. No epistaxis. Right sinus exhibits no maxillary sinus tenderness and no frontal sinus tenderness. Left sinus exhibits no maxillary sinus tenderness and no frontal sinus tenderness.  Mouth/Throat: Oropharynx is clear and moist and mucous membranes are normal. Mucous membranes are not pale, not dry and not cyanotic. No oral lesions. No trismus in the jaw. No uvula swelling. No  oropharyngeal exudate, posterior oropharyngeal edema or tonsillar abscesses.  Eyes: Conjunctivae are normal. Right eye exhibits no discharge. Left eye exhibits no discharge. No scleral icterus.  Neck: Normal range of motion. Neck supple. No JVD present. No thyromegaly present.  Cardiovascular: Normal rate, regular rhythm and intact distal pulses.  Exam reveals no gallop and no friction rub.   No murmur heard. Pulmonary/Chest: Effort normal. No accessory muscle usage. No tachypnea. No respiratory distress. He has no decreased breath sounds. He has no wheezes. He has rhonchi in the right middle field, the right lower field, the left middle field and the left lower field. He has no rales. He exhibits no tenderness.  He has diffuse, bilateral, expiratory rhonchi worse on the right than the left. He does have good air movement.  Abdominal: Soft. Bowel sounds are normal. He exhibits no distension and no mass. There is no tenderness. There is no rebound and no guarding.  Musculoskeletal: Normal range of motion. He exhibits no edema, tenderness or deformity.  Lymphadenopathy:    He has no cervical adenopathy.  Neurological: He is alert and oriented to person, place, and time.  Skin: Skin is warm and dry. No rash noted. He is not diaphoretic. No erythema. No pallor.    Lab Results  Component Value Date   WBC 15.7 (H) 12/07/2016   HGB 12.7 (L) 12/07/2016   HCT 36.4 (L) 12/07/2016   PLT 287 12/07/2016   GLUCOSE 135 (H) 12/08/2016   ALT 20 12/06/2016   AST 26 12/06/2016   NA 139 12/08/2016   K 3.9 12/08/2016   CL 109 12/08/2016   CREATININE 0.68 12/08/2016   BUN 11 12/08/2016   CO2 23 12/08/2016   HGBA1C 5.9 (H) 12/07/2016    Dg Chest 2 View  Result Date: 12/15/2016 CLINICAL DATA:  History of pneumonia, some shortness of breath, former smoking history EXAM: CHEST  2 VIEW COMPARISON:  CT chest of 12/06/2016 and chest x-ray of 06/07/2016 FINDINGS: The lungs remain markedly hyperaerated with  increased AP diameter and flattened hemidiaphragms consistent with emphysematous change. Linear scarring in the mid lungs is stable. The recent CT of the chest demonstrated lower lobe opacities which appear to have improved in the interval. No pleural effusion is seen. Mediastinal and hilar contours are unremarkable. There are mild degenerative changes in the mid to lower thoracic spine. IMPRESSION: Severe emphysematous change. Probable partial clearing of lower lobe opacities noted on recent CT of the chest. Electronically Signed   By: Ivar Drape M.D.   On: 12/15/2016 16:50   Dg Chest 2 View  Result Date: 01/02/2017 CLINICAL DATA:  Productive cough, dyspnea and congestion EXAM: CHEST  2 VIEW COMPARISON:  12/15/2016 FINDINGS: Emphysematous hyperinflation of the lungs with linear scarring in the mid lungs appear chronic and stable. No  pneumonic consolidation, CHF, effusion or pneumothorax. Postsurgical changes are noted in the medial aspect of the right a thorax with chain sutures present. No acute nor suspicious osseous abnormalities. Heart and mediastinal contours are stable. Mild degenerative change along the mid to lower thoracic spine. IMPRESSION: Marked emphysematous hyperinflation of the lungs with chronic stable bilateral mid lung scarring and right upper lobe postop change. No acute pneumonic consolidation, CHF, effusion or pneumothorax. Electronically Signed   By: Ashley Royalty M.D.   On: 01/02/2017 18:36     Assessment & Plan:   Cutter was seen today for cough.  Diagnoses and all orders for this visit:  Community acquired pneumonia, unspecified laterality- his chest x-rays improved. I don't think his current symptoms are related to pneumonia. -     DG Chest 2 View; Future  Mixed simple and mucopurulent chronic bronchitis ([place])- he is having a mild exacerbation. Will treat with a course of systemic methylprednisolone. -     methylPREDNISolone (MEDROL DOSEPAK) 4 MG TBPK tablet; TAKE AS  DIRECTED  Seasonal allergic rhinitis due to pollen- he is having a mild exacerbation. Will start Atrovent nasal spray and will treat with a course of systemic methylprednisolone. -     ipratropium (ATROVENT) 0.03 % nasal spray; Place 2 sprays into both nostrils every 12 (twelve) hours. -     methylPREDNISolone (MEDROL DOSEPAK) 4 MG TBPK tablet; TAKE AS DIRECTED   I am having Joshua Cross start on ipratropium and methylPREDNISolone. I am also having him maintain his rosuvastatin, PARoxetine, LUMIGAN, PROAIR HFA, Fluticasone-Umeclidin-Vilant, Probiotic Product (PROBIOTIC ADVANCED PO), magnesium gluconate, Potassium, TURMERIC PO, Misc Natural Products (TART CHERRY ADVANCED PO), Fish Oil, aspirin EC, OVER THE COUNTER MEDICATION, OVER THE COUNTER MEDICATION, Melatonin, Roflumilast, and albuterol.  Meds ordered this encounter  Medications  . ipratropium (ATROVENT) 0.03 % nasal spray    Sig: Place 2 sprays into both nostrils every 12 (twelve) hours.    Dispense:  30 mL    Refill:  11  . methylPREDNISolone (MEDROL DOSEPAK) 4 MG TBPK tablet    Sig: TAKE AS DIRECTED    Dispense:  21 tablet    Refill:  0     Follow-up: Return in about 6 weeks (around 02/13/2017).  Scarlette Calico, MD

## 2017-01-03 ENCOUNTER — Encounter: Payer: Self-pay | Admitting: Internal Medicine

## 2017-01-05 ENCOUNTER — Encounter: Payer: Self-pay | Admitting: Internal Medicine

## 2017-01-11 LAB — HM COLONOSCOPY

## 2017-01-18 ENCOUNTER — Ambulatory Visit (AMBULATORY_SURGERY_CENTER): Payer: Medicare Other | Admitting: Internal Medicine

## 2017-01-18 ENCOUNTER — Encounter: Payer: Self-pay | Admitting: Internal Medicine

## 2017-01-18 VITALS — BP 140/77 | HR 68 | Temp 97.3°F | Resp 16 | Ht 67.0 in | Wt 131.0 lb

## 2017-01-18 DIAGNOSIS — D125 Benign neoplasm of sigmoid colon: Secondary | ICD-10-CM | POA: Diagnosis not present

## 2017-01-18 DIAGNOSIS — Z860100 Personal history of colon polyps, unspecified: Secondary | ICD-10-CM

## 2017-01-18 DIAGNOSIS — D124 Benign neoplasm of descending colon: Secondary | ICD-10-CM | POA: Diagnosis not present

## 2017-01-18 DIAGNOSIS — K635 Polyp of colon: Secondary | ICD-10-CM

## 2017-01-18 MED ORDER — SODIUM CHLORIDE 0.9 % IV SOLN: 500.0000 mL | INTRAVENOUS | Status: DC

## 2017-01-18 NOTE — Patient Instructions (Signed)
YOU HAD AN ENDOSCOPIC PROCEDURE TODAY AT [facility]:   Refer to the procedure report that was given to you for any specific questions about what was found during the examination.  If the procedure report does not answer your questions, please call your gastroenterologist to clarify.  If you requested that your care partner not be given the details of your procedure findings, then the procedure report has been included in a sealed envelope for you to review at your convenience later.  YOU SHOULD EXPECT: Some feelings of bloating in the abdomen. Passage of more gas than usual.  Walking can help get rid of the air that was put into your GI tract during the procedure and reduce the bloating. If you had a lower endoscopy (such as a colonoscopy or flexible sigmoidoscopy) you may notice spotting of blood in your stool or on the toilet paper. If you underwent a bowel prep for your procedure, you may not have a normal bowel movement for a few days.  Please Note:  You might notice some irritation and congestion in your nose or some drainage.  This is from the oxygen used during your procedure.  There is no need for concern and it should clear up in a day or so.  SYMPTOMS TO REPORT IMMEDIATELY:   Following lower endoscopy (colonoscopy or flexible sigmoidoscopy):  Excessive amounts of blood in the stool  Significant tenderness or worsening of abdominal pains  Swelling of the abdomen that is new, acute  Fever of 100F or higher   Following upper endoscopy (EGD)  Vomiting of blood or coffee ground material  New chest pain or pain under the shoulder blades  Painful or persistently difficult swallowing  New shortness of breath  Fever of 100F or higher  Black, tarry-looking stools  For urgent or emergent issues, a gastroenterologist can be reached at any hour by calling [phone].   DIET:  We do recommend a small meal at first, but then you may proceed to your regular diet.  Drink  plenty of fluids but you should avoid alcoholic beverages for 24 hours.  ACTIVITY:  You should plan to take it easy for the rest of today and you should NOT DRIVE or use heavy machinery until tomorrow (because of the sedation medicines used during the test).    FOLLOW UP: Our staff will call the number listed on your records the next business day following your procedure to check on you and address any questions or concerns that you may have regarding the information given to you following your procedure. If we do not reach you, we will leave a message.  However, if you are feeling well and you are not experiencing any problems, there is no need to return our call.  We will assume that you have returned to your regular daily activities without incident.  If any biopsies were taken you will be contacted by phone or by letter within the next 1-3 weeks.  Please call us at [phone] if you have not heard about the biopsies in 3 weeks.    SIGNATURES/CONFIDENTIALITY: You and/or your care partner have signed paperwork which will be entered into your electronic medical record.  These signatures attest to the fact that that the information above on your After Visit Summary has been reviewed and is understood.  Full responsibility of the confidentiality of this discharge information lies with you and/or your care-partner.   Polyp and hemorrjpoid information given.

## 2017-01-18 NOTE — Op Note (Signed)
[place] Patient Name: Joshua Cross Procedure Date: 01/18/2017 2:56 PM MRN: [id number] Endoscopist: Docia Chuck. Henrene Pastor , MD Age: 71 Referring MD:  Date of Birth: 1945-11-20 Gender: Male Account #: [id number] Procedure:                Colonoscopy, with cold snare polypectomy x 2 Indications:              High risk colon cancer surveillance: Personal                            history of non-advanced adenoma. Index exam                            elsewhere 2013 with small tubular adenoma.                            Follow-up examination elsewhere 2016 with poor                            prep. Patient recommended to follow-up in 2-3                            months but did not. Follows up at this institution                            at this time. 2 day prep with clear liquids,                            magnesium citrate, and Suprep provided for this                            procedure Medicines:                Monitored Anesthesia Care Procedure:                Pre-Anesthesia Assessment:                           - Prior to the procedure, a History and Physical                            was performed, and patient medications and                            allergies were reviewed. The patient's tolerance of                            previous anesthesia was also reviewed. The risks                            and benefits of the procedure and the sedation                            options and risks were discussed with the patient.  All questions were answered, and informed consent                            was obtained. Prior Anticoagulants: The patient has                            taken no previous anticoagulant or antiplatelet                            agents. ASA Grade Assessment: II - A patient with                            mild systemic disease. After reviewing the risks                            and benefits, the patient was  deemed in                            satisfactory condition to undergo the procedure.                           After obtaining informed consent, the colonoscope                            was passed under direct vision. Throughout the                            procedure, the patient's blood pressure, pulse, and                            oxygen saturations were monitored continuously. The                            Colonoscope was introduced through the anus and                            advanced to the the cecum, identified by                            appendiceal orifice and ileocecal valve. The                            ileocecal valve, appendiceal orifice, and rectum                            were photographed. The quality of the bowel                            preparation was good. The colonoscopy was performed                            without difficulty. The patient tolerated the  procedure well. The bowel preparation used was                            SUPREP(in addition to 2 days of clear liquids and                            magnesium citrate). Scope In: 3:13:20 PM Scope Out: 3:37:37 PM Scope Withdrawal Time: 0 hours 18 minutes 31 seconds  Total Procedure Duration: 0 hours 24 minutes 17 seconds  Findings:                 Two polyps were found in the sigmoid colon and                            descending colon. The polyps were 4 to 7 mm in                            size. These polyps were removed with a cold snare.                            Resection and retrieval were complete.                           Internal hemorrhoids were found during retroflexion.                           The exam was otherwise without abnormality on                            direct and retroflexion views. Complications:            No immediate complications. Estimated blood loss:                            None. Estimated Blood Loss:     Estimated blood loss:  none. Impression:               - Two 4 to 7 mm polyps in the sigmoid colon and in                            the descending colon, removed with a cold snare.                            Resected and retrieved.                           - Internal hemorrhoids.                           - The examination was otherwise normal on direct                            and retroflexion views. Recommendation:           - Repeat colonoscopy in 5 years for surveillance.                           -  Patient has a contact number available for                            emergencies. The signs and symptoms of potential                            delayed complications were discussed with the                            patient. Return to normal activities tomorrow.                            Written discharge instructions were provided to the                            patient.                           - Resume previous diet.                           - Continue present medications.                           - Await pathology results. Docia Chuck. Henrene Pastor, MD 01/18/2017 3:46:36 PM This report has been signed electronically.

## 2017-01-18 NOTE — Progress Notes (Signed)
Called to room to assist during endoscopic procedure.  Patient ID and intended procedure confirmed with present staff. Received instructions for my participation in the procedure from the performing physician.  

## 2017-01-18 NOTE — Progress Notes (Signed)
To PACU, VSS. Report to RN.tb 

## 2017-01-19 ENCOUNTER — Other Ambulatory Visit: Payer: Self-pay | Admitting: Internal Medicine

## 2017-01-19 ENCOUNTER — Telehealth: Payer: Self-pay | Admitting: *Deleted

## 2017-01-19 NOTE — Telephone Encounter (Signed)
  Follow up Call-  Call back number 01/18/2017  Post procedure Call Back phone  # [phone]  Permission to leave phone message Yes  Some recent data might be hidden     Patient questions:  Do you have a fever, pain , or abdominal swelling? No. Pain Score  0 *  Have you tolerated food without any problems? Yes.    Have you been able to return to your normal activities? Yes.    Do you have any questions about your discharge instructions: Diet   No. Medications  No. Follow up visit  No.  Do you have questions or concerns about your Care? No.  Actions: * If pain score is 4 or above: No action needed, pain <4.

## 2017-01-23 ENCOUNTER — Ambulatory Visit: Payer: Medicare Other | Admitting: Internal Medicine

## 2017-01-26 ENCOUNTER — Encounter: Payer: Self-pay | Admitting: Internal Medicine

## 2017-03-06 ENCOUNTER — Other Ambulatory Visit: Payer: Self-pay | Admitting: Internal Medicine

## 2017-03-06 DIAGNOSIS — F3342 Major depressive disorder, recurrent, in full remission: Secondary | ICD-10-CM

## 2017-03-06 MED ORDER — PAROXETINE HCL 20 MG PO TABS: 20.0000 mg | ORAL_TABLET | Freq: Every day | ORAL | 3 refills | Status: DC

## 2017-03-08 ENCOUNTER — Other Ambulatory Visit: Payer: Self-pay | Admitting: Internal Medicine

## 2017-03-08 DIAGNOSIS — J301 Allergic rhinitis due to pollen: Secondary | ICD-10-CM

## 2017-03-08 DIAGNOSIS — J418 Mixed simple and mucopurulent chronic bronchitis: Secondary | ICD-10-CM

## 2017-03-08 MED ORDER — METHYLPREDNISOLONE 4 MG PO TBPK
ORAL_TABLET | ORAL | 0 refills | Status: AC
Start: 2017-03-08 — End: 2017-03-15

## 2017-03-20 ENCOUNTER — Other Ambulatory Visit: Payer: Self-pay | Admitting: Internal Medicine

## 2017-03-20 DIAGNOSIS — J418 Mixed simple and mucopurulent chronic bronchitis: Secondary | ICD-10-CM

## 2017-03-20 MED ORDER — BUDESONIDE 0.5 MG/2ML IN SUSP: 0.5000 mg | Freq: Two times a day (BID) | RESPIRATORY_TRACT | 11 refills | Status: DC

## 2017-03-20 MED ORDER — ARFORMOTEROL TARTRATE 15 MCG/2ML IN NEBU: 15.0000 ug | INHALATION_SOLUTION | Freq: Two times a day (BID) | RESPIRATORY_TRACT | 11 refills | Status: DC

## 2017-03-20 MED ORDER — GLYCOPYRROLATE 25 MCG/ML IN SOLN: 1.0000 | Freq: Two times a day (BID) | RESPIRATORY_TRACT | 1 refills | Status: DC

## 2017-03-20 MED ORDER — GLYCOPYRROLATE 25 MCG/ML IN SOLN: 1.0000 | Freq: Two times a day (BID) | RESPIRATORY_TRACT | 11 refills | Status: DC

## 2017-03-31 ENCOUNTER — Other Ambulatory Visit: Payer: Self-pay | Admitting: Internal Medicine

## 2017-03-31 DIAGNOSIS — J418 Mixed simple and mucopurulent chronic bronchitis: Secondary | ICD-10-CM

## 2017-03-31 MED ORDER — PREDNISONE 10 MG PO TABS: 40.0000 mg | ORAL_TABLET | Freq: Every day | ORAL | 0 refills | Status: AC

## 2017-04-01 ENCOUNTER — Other Ambulatory Visit: Payer: Self-pay | Admitting: Internal Medicine

## 2017-04-01 DIAGNOSIS — J988 Other specified respiratory disorders: Secondary | ICD-10-CM | POA: Insufficient documentation

## 2017-04-01 DIAGNOSIS — J22 Unspecified acute lower respiratory infection: Secondary | ICD-10-CM | POA: Insufficient documentation

## 2017-04-01 MED ORDER — LEVOFLOXACIN 500 MG PO TABS: 500.0000 mg | ORAL_TABLET | Freq: Every day | ORAL | 0 refills | Status: AC

## 2017-04-11 HISTORY — PX: OTHER SURGICAL HISTORY: SHX169

## 2017-04-14 ENCOUNTER — Telehealth: Payer: Self-pay

## 2017-04-14 NOTE — Telephone Encounter (Signed)
Key: [facility]

## 2017-04-17 NOTE — Telephone Encounter (Signed)
PA was approved. Can you call pt and inform of same.

## 2017-04-17 NOTE — Telephone Encounter (Signed)
Pt informed and expressed understanding. 

## 2017-04-28 ENCOUNTER — Telehealth: Payer: Self-pay | Admitting: Internal Medicine

## 2017-04-28 ENCOUNTER — Other Ambulatory Visit: Payer: Self-pay | Admitting: Internal Medicine

## 2017-04-28 DIAGNOSIS — J418 Mixed simple and mucopurulent chronic bronchitis: Secondary | ICD-10-CM

## 2017-04-28 DIAGNOSIS — J988 Other specified respiratory disorders: Secondary | ICD-10-CM

## 2017-04-28 DIAGNOSIS — J441 Chronic obstructive pulmonary disease with (acute) exacerbation: Secondary | ICD-10-CM

## 2017-04-28 MED ORDER — LEVOFLOXACIN 500 MG PO TABS: 500.0000 mg | ORAL_TABLET | Freq: Every day | ORAL | 0 refills | Status: AC

## 2017-04-28 NOTE — Telephone Encounter (Signed)
[facility] called and wanted clarification of order that was placed today.  Read order to [place]. They requested recent OV information. LOV was 12/2016 with O2 saturation RA resting at 95%. This did not qualify patient for [facility] O2 therapy. Gave verbal okay for [facility] to do an overnight to see if pt qualifies for O2.

## 2017-04-28 NOTE — Telephone Encounter (Signed)
error 

## 2017-04-30 ENCOUNTER — Emergency Department (HOSPITAL_COMMUNITY)
Admission: EM | Admit: 2017-04-30 | Discharge: 2017-04-30 | Disposition: A | Discharge status: Home / Self Care | Payer: Medicare Other | Attending: Emergency Medicine | Admitting: Emergency Medicine

## 2017-04-30 ENCOUNTER — Emergency Department (HOSPITAL_COMMUNITY): Payer: Medicare Other

## 2017-04-30 ENCOUNTER — Encounter (HOSPITAL_COMMUNITY): Payer: Self-pay | Admitting: Emergency Medicine

## 2017-04-30 ENCOUNTER — Other Ambulatory Visit: Payer: Self-pay | Admitting: Internal Medicine

## 2017-04-30 DIAGNOSIS — Z87891 Personal history of nicotine dependence: Secondary | ICD-10-CM | POA: Insufficient documentation

## 2017-04-30 DIAGNOSIS — J441 Chronic obstructive pulmonary disease with (acute) exacerbation: Secondary | ICD-10-CM | POA: Diagnosis not present

## 2017-04-30 DIAGNOSIS — J418 Mixed simple and mucopurulent chronic bronchitis: Secondary | ICD-10-CM

## 2017-04-30 DIAGNOSIS — R0602 Shortness of breath: Secondary | ICD-10-CM | POA: Diagnosis not present

## 2017-04-30 DIAGNOSIS — R05 Cough: Secondary | ICD-10-CM | POA: Diagnosis present

## 2017-04-30 DIAGNOSIS — Z79899 Other long term (current) drug therapy: Secondary | ICD-10-CM | POA: Insufficient documentation

## 2017-04-30 LAB — CBC WITH DIFFERENTIAL/PLATELET
BASOS ABS: 0 10*3/uL (ref 0.0–0.1)
Basophils Relative: 0 %
EOS PCT: 3 %
Eosinophils Absolute: 0.3 10*3/uL (ref 0.0–0.7)
HCT: 45.8 % (ref 39.0–52.0)
Hemoglobin: 15.8 g/dL (ref 13.0–17.0)
LYMPHS PCT: 9 %
Lymphs Abs: 0.8 10*3/uL (ref 0.7–4.0)
MCH: 31 pg (ref 26.0–34.0)
MCHC: 34.5 g/dL (ref 30.0–36.0)
MCV: 90 fL (ref 78.0–100.0)
MONO ABS: 0.1 10*3/uL (ref 0.1–1.0)
Monocytes Relative: 1 %
Neutro Abs: 7.8 10*3/uL — ABNORMAL HIGH (ref 1.7–7.7)
Neutrophils Relative %: 87 %
PLATELETS: 308 10*3/uL (ref 150–400)
RBC: 5.09 MIL/uL (ref 4.22–5.81)
RDW: 13.3 % (ref 11.5–15.5)
WBC: 9 10*3/uL (ref 4.0–10.5)

## 2017-04-30 LAB — BASIC METABOLIC PANEL
ANION GAP: 7 (ref 5–15)
BUN: 12 mg/dL (ref 6–20)
CALCIUM: 9 mg/dL (ref 8.9–10.3)
CO2: 24 mmol/L (ref 22–32)
CREATININE: 0.71 mg/dL (ref 0.61–1.24)
Chloride: 103 mmol/L (ref 101–111)
GFR calc Af Amer: >60 mL/min (ref >60)
Glucose, Bld: 114 mg/dL — ABNORMAL HIGH (ref 65–99)
Potassium: 4.2 mmol/L (ref 3.5–5.1)
Sodium: 134 mmol/L — ABNORMAL LOW (ref 135–145)

## 2017-04-30 LAB — TROPONIN I: Troponin I: <0.03 ng/mL (ref <0.03)

## 2017-04-30 MED ORDER — ALBUTEROL SULFATE (2.5 MG/3ML) 0.083% IN NEBU
2.5000 mg | INHALATION_SOLUTION | Freq: Once | RESPIRATORY_TRACT | Status: AC
  Administered 2017-04-30: 2.5 mg via RESPIRATORY_TRACT

## 2017-04-30 MED ORDER — SODIUM CHLORIDE 0.9 % IV BOLUS (SEPSIS)
1000.0000 mL | Freq: Once | INTRAVENOUS | Status: AC
  Administered 2017-04-30: 1000 mL via INTRAVENOUS

## 2017-04-30 MED ORDER — PREDNISONE 20 MG PO TABS: 40.0000 mg | ORAL_TABLET | Freq: Every day | ORAL | 0 refills | Status: DC

## 2017-04-30 MED ORDER — ALBUTEROL (5 MG/ML) CONTINUOUS INHALATION SOLN
10.0000 mg/h | INHALATION_SOLUTION | RESPIRATORY_TRACT | Status: DC
  Administered 2017-04-30: 10 mg/h via RESPIRATORY_TRACT

## 2017-04-30 MED ORDER — ONDANSETRON HCL 4 MG/2ML IJ SOLN
4.0000 mg | Freq: Once | INTRAMUSCULAR | Status: AC
  Administered 2017-04-30: 4 mg via INTRAVENOUS
  Filled 2017-04-30: qty 2

## 2017-04-30 MED ORDER — ALBUTEROL (5 MG/ML) CONTINUOUS INHALATION SOLN
INHALATION_SOLUTION | RESPIRATORY_TRACT | Status: AC
  Filled 2017-04-30: qty 20

## 2017-04-30 MED ORDER — PREDNISONE 10 MG PO TABS: 10.0000 mg | ORAL_TABLET | Freq: Every day | ORAL | 0 refills | Status: DC

## 2017-04-30 MED ORDER — IPRATROPIUM-ALBUTEROL 0.5-2.5 (3) MG/3ML IN SOLN
3.0000 mL | Freq: Four times a day (QID) | RESPIRATORY_TRACT | Status: DC
  Administered 2017-04-30: 3 mL via RESPIRATORY_TRACT
  Filled 2017-04-30: qty 3

## 2017-04-30 MED ORDER — ALBUTEROL SULFATE (2.5 MG/3ML) 0.083% IN NEBU
INHALATION_SOLUTION | RESPIRATORY_TRACT | Status: AC
  Filled 2017-04-30: qty 3

## 2017-04-30 MED ORDER — IPRATROPIUM-ALBUTEROL 0.5-2.5 (3) MG/3ML IN SOLN
RESPIRATORY_TRACT | Status: AC
  Filled 2017-04-30: qty 3

## 2017-04-30 MED ORDER — METHYLPREDNISOLONE SODIUM SUCC 125 MG IJ SOLR
125.0000 mg | Freq: Once | INTRAMUSCULAR | Status: AC
  Administered 2017-04-30: 125 mg via INTRAVENOUS
  Filled 2017-04-30: qty 2

## 2017-04-30 NOTE — ED Notes (Signed)
[facility] has arranged for home 02 to be delivered and a transport tank to be delivered to hospital by [facility]

## 2017-04-30 NOTE — Discharge Instructions (Signed)
Chest x-ray shows no obvious pneumonia.  Continue all of your home breathing treatments.  Recommend supplemental oxygen.  Prescription for prednisone to start tomorrow.

## 2017-04-30 NOTE — ED Provider Notes (Signed)
[facility] DEPT Provider Note   CSN: [id number] Arrival date & time: 04/30/17  1508     History   Chief Complaint Chief Complaint  Patient presents with  . Cough  . Shortness of Breath    HPI Joshua Cross is a 72 y.o. male.  Level 5 caveat for urgent need for intervention.  Patient with a known history of COPD presents with dyspnea and coughing.  Has been using his home nebulizer treatments and inhalers to no avail.  His primary care doctor has ordered oxygen for him at home.  No fever, sweats, chills, chest pain, behavioral changes.  Severity of symptoms is moderate.  He is presently on an antibiotic "that starts with L"      Past Medical History:  Diagnosis Date  . COPD (chronic obstructive pulmonary disease) ([place])   . Hyperlipidemia     Patient Active Problem List   Diagnosis Date Noted  . RTI (respiratory tract infection) 04/01/2017  . Hyperlipidemia 12/06/2016  . Screen for colon cancer 09/02/2016  . Seasonal allergic rhinitis due to pollen 07/26/2016  . Mixed simple and mucopurulent chronic bronchitis ([place]) 06/07/2016    Past Surgical History:  Procedure Laterality Date  . HERNIA REPAIR    . LUNG REMOVAL, PARTIAL Right 1985  . TONSILLECTOMY         Home Medications    Prior to Admission medications   Medication Sig Start Date End Date Taking? Authorizing Provider  albuterol (PROVENTIL HFA;VENTOLIN HFA) 108 (90 Base) MCG/ACT inhaler Inhale 2 puffs into the lungs every 6 (six) hours as needed for wheezing or shortness of breath. 12/08/16  Yes Reyne Dumas, MD  arformoterol (BROVANA) 15 MCG/2ML NEBU Take 2 mLs (15 mcg total) by nebulization 2 (two) times daily. 03/20/17  Yes Janith Lima, MD  aspirin EC 81 MG tablet Take 81 mg by mouth daily.   Yes [provider]  budesonide (PULMICORT) 0.5 MG/2ML nebulizer solution Take 2 mLs (0.5 mg total) by nebulization 2 (two) times daily. 03/20/17  Yes Janith Lima, MD  cholecalciferol (VITAMIN D) 1000 units tablet Take 2,000 Units by mouth daily.   Yes [provider]  CHOLINE PO Take 2 tablets by mouth daily.   Yes [provider]  Glycopyrrolate (LONHALA MAGNAIR REFILL KIT) 25 MCG/ML SOLN Inhale 1 Act into the lungs 2 (two) times daily. 03/20/17  Yes Janith Lima, MD  ipratropium (ATROVENT) 0.03 % nasal spray Place 2 sprays into both nostrils every 12 (twelve) hours. 01/02/17  Yes Janith Lima, MD  levofloxacin (LEVAQUIN) 500 MG tablet Take 1 tablet (500 mg total) by mouth daily for 7 days. 04/28/17 05/05/17 Yes Janith Lima, MD  LUMIGAN 0.01 % SOLN Instill 1 drop into both eyes once daily at bedtime 06/01/16  Yes [provider]  magnesium gluconate (MAGONATE) 500 MG tablet Take 500 mg by mouth daily.   Yes [provider]  Melatonin 10 MG CAPS Take 10 mg by mouth daily.    Yes [provider]  Misc Natural Products (TART CHERRY ADVANCED PO) Take 1 tablet by mouth daily.    Yes [provider]  Omega-3 Fatty Acids (FISH OIL) 1200 MG CAPS Take 1,200 mg by mouth 2 (two) times daily.    Yes [provider]  PARoxetine (PAXIL) 20 MG tablet Take 1 tablet (20 mg total) by mouth daily. 03/06/17  Yes Janith Lima, MD  Potassium 99 MG TABS Take 1 tablet  by mouth daily.    Yes [provider]  Probiotic Product (PROBIOTIC ADVANCED PO) Take 1 capsule by mouth daily. From deep roots    Yes [provider]  Roflumilast (DALIRESP) 250 MCG TABS Take 1 tablet by mouth daily. 11/25/16  Yes Janith Lima, MD  rosuvastatin (CRESTOR) 10 MG tablet Take 1 tablet (10 mg total) by mouth daily. 01/19/17  Yes Janith Lima, MD  TURMERIC PO Take 1 tablet by mouth daily.    Yes [provider]  Fluticasone-Umeclidin-Vilant (TRELEGY ELLIPTA) 100-62.5-25 MCG/INH AEPB Inhale 1 Act into the lungs daily. Patient not taking: Reported on 04/30/2017 09/02/16   Janith Lima, MD    Glycopyrrolate [facility] MAGNAIR STARTER KIT) 25 MCG/ML SOLN Inhale 1 Act into the lungs 2 (two) times daily. Patient not taking: Reported on 04/30/2017 03/20/17   Janith Lima, MD  predniSONE (DELTASONE) 10 MG tablet Take 1 tablet (10 mg total) by mouth daily with breakfast. 4 tablets for 3 days, 3 tablets for 3 days, 2 tablets for 3 days, 1 tablet for 3 days. 04/30/17   Nat Christen, MD    Family History Family History  Problem Relation Age of Onset  . Alcohol abuse Mother   . Stroke Father   . Depression Sister   . Heart disease Sister   . Cancer Neg Hx   . COPD Neg Hx   . Early death Neg Hx   . Kidney disease Neg Hx   . Hypertension Neg Hx   . Learning disabilities Neg Hx     Social History Social History   Tobacco Use  . Smoking status: Former Smoker    Types: Cigarettes    Last attempt to quit: 08/07/2006    Years since quitting: 10.7  . Smokeless tobacco: Never Used  Substance Use Topics  . Alcohol use: No  . Drug use: No     Allergies   Erythromycin and Penicillins   Review of Systems Review of Systems  Unable to perform ROS: Acuity of condition     Physical Exam Updated Vital Signs BP 121/60   Pulse (!) 106   Resp 20   SpO2 95%   Physical Exam  Constitutional: He is oriented to person, place, and time.  Tachypneic, dyspneic, low pulse ox  HENT:  Head: Normocephalic and atraumatic.  Eyes: Conjunctivae are normal.  Neck: Neck supple.  Cardiovascular: Normal rate and regular rhythm.  Pulmonary/Chest:  Tachypneic, scattered rhonchi and wheezes.  Abdominal: Soft. Bowel sounds are normal.  Musculoskeletal: Normal range of motion.  Neurological: He is alert and oriented to person, place, and time.  Skin: Skin is warm and dry.  Psychiatric: He has a normal mood and affect. His behavior is normal.  Nursing note and vitals reviewed.    ED Treatments / Results  Labs (all labs ordered are listed, but only abnormal results are displayed) Labs  Reviewed  CBC WITH DIFFERENTIAL/PLATELET - Abnormal; Notable for the following components:      Result Value   Neutro Abs 7.8 (*)    All other components within normal limits  BASIC METABOLIC PANEL - Abnormal; Notable for the following components:   Sodium 134 (*)    Glucose, Bld 114 (*)    All other components within normal limits  TROPONIN I    EKG  EKG Interpretation  Date/Time:  Sunday April 30 2017 15:36:04 EST Ventricular Rate:  91 PR Interval:    QRS Duration: 108 QT Interval:  360 QTC  Calculation: 443 R Axis:   110 Text Interpretation:  Sinus rhythm Biatrial enlargement Right axis deviation Borderline repolarization abnormality Artifact in lead(s) V4 Confirmed by Nat Christen [phone]) on 04/30/2017 4:36:45 PM       Radiology Dg Chest 2 View  Result Date: 04/30/2017 CLINICAL DATA:  Shortness of breath history of COPD EXAM: CHEST  2 VIEW COMPARISON:  01/02/2017, CT chest 12/06/2016 FINDINGS: Hyperinflation with emphysematous disease. No acute consolidation or effusion. Stable cardiomediastinal silhouette. Postsurgical changes in the right upper lung. Degenerative changes of the spine. No pneumothorax. IMPRESSION: No active cardiopulmonary disease. Hyperinflation with emphysematous disease. Electronically Signed   By: Donavan Foil M.D.   On: 04/30/2017 18:00    Procedures Procedures (including critical care time)  Medications Ordered in ED Medications  ipratropium-albuterol (DUONEB) 0.5-2.5 (3) MG/3ML nebulizer solution 3 mL (3 mLs Nebulization Given 04/30/17 1544)  ipratropium-albuterol (DUONEB) 0.5-2.5 (3) MG/3ML nebulizer solution (  Not Given 04/30/17 1548)  albuterol (PROVENTIL) (2.5 MG/3ML) 0.083% nebulizer solution (  Not Given 04/30/17 1547)  albuterol (PROVENTIL,VENTOLIN) solution continuous neb (0 mg/hr Nebulization Stopped 04/30/17 1921)  albuterol (PROVENTIL, VENTOLIN) (5 MG/ML) 0.5% continuous inhalation solution (not administered)  albuterol (PROVENTIL) (2.5  MG/3ML) 0.083% nebulizer solution 2.5 mg (2.5 mg Nebulization Given 04/30/17 1544)  sodium chloride 0.9 % bolus 1,000 mL (0 mLs Intravenous Stopped 04/30/17 1921)  methylPREDNISolone sodium succinate (SOLU-MEDROL) 125 mg/2 mL injection 125 mg (125 mg Intravenous Given 04/30/17 1553)  ondansetron (ZOFRAN) injection 4 mg (4 mg Intravenous Given 04/30/17 1553)     Initial Impression / Assessment and Plan / ED Course  I have reviewed the triage vital signs and the nursing notes.  Pertinent labs & imaging results that were available during my care of the patient were reviewed by me and considered in my medical decision making (see chart for details).     Patient with a known history of COPD presents with worsening cough and wheezing.  He is responded well to a continuous nebulizer treatment, IV steroids, supplemental oxygen.  Chest x-ray shows no obvious pneumonia.  We will continue his home antibiotic.  Prednisone taper.  Follow-up with primary care.  Final Clinical Impressions(s) / ED Diagnoses   Final diagnoses:  COPD exacerbation [facility])    ED Discharge Orders        Ordered    predniSONE (DELTASONE) 10 MG tablet  Daily with breakfast     04/30/17 1907       Nat Christen, MD 04/30/17 1932

## 2017-04-30 NOTE — ED Triage Notes (Signed)
Patient BIB family, c/o cough for unknown about of time. Labored breathing on arrival. Hx COPD and emphysema.

## 2017-05-03 NOTE — Addendum Note (Signed)
Addended by: Aviva Signs M on: 05/03/2017 01:43 PM   Modules accepted: Orders

## 2017-05-03 NOTE — Telephone Encounter (Signed)
Order entered and message sent to [facility].

## 2017-05-03 NOTE — Telephone Encounter (Signed)
Joshua Cross from [facility] stopped by and they can do the overnight but would need walk test to qualify for 24 hr.  Please send to [employer] through [employer].

## 2017-05-08 ENCOUNTER — Other Ambulatory Visit: Payer: Self-pay | Admitting: Internal Medicine

## 2017-05-08 DIAGNOSIS — J418 Mixed simple and mucopurulent chronic bronchitis: Secondary | ICD-10-CM

## 2017-05-08 DIAGNOSIS — E7800 Pure hypercholesterolemia, unspecified: Secondary | ICD-10-CM

## 2017-05-08 MED ORDER — ROSUVASTATIN CALCIUM 10 MG PO TABS: 10.0000 mg | ORAL_TABLET | Freq: Every day | ORAL | 1 refills | Status: DC

## 2017-05-10 ENCOUNTER — Other Ambulatory Visit (INDEPENDENT_AMBULATORY_CARE_PROVIDER_SITE_OTHER): Payer: Medicare Other

## 2017-05-10 ENCOUNTER — Ambulatory Visit: Payer: Medicare Other | Admitting: Internal Medicine

## 2017-05-10 ENCOUNTER — Encounter: Payer: Self-pay | Admitting: Internal Medicine

## 2017-05-10 VITALS — BP 142/80 | HR 76 | Temp 97.7°F | Resp 20 | Ht 67.0 in | Wt 130.0 lb

## 2017-05-10 DIAGNOSIS — Z1159 Encounter for screening for other viral diseases: Secondary | ICD-10-CM

## 2017-05-10 DIAGNOSIS — R739 Hyperglycemia, unspecified: Secondary | ICD-10-CM

## 2017-05-10 DIAGNOSIS — E785 Hyperlipidemia, unspecified: Secondary | ICD-10-CM

## 2017-05-10 DIAGNOSIS — J418 Mixed simple and mucopurulent chronic bronchitis: Secondary | ICD-10-CM

## 2017-05-10 LAB — BASIC METABOLIC PANEL
BUN: 13 mg/dL (ref 6–23)
CO2: 27 mEq/L (ref 19–32)
Calcium: 9.1 mg/dL (ref 8.4–10.5)
Chloride: 100 mEq/L (ref 96–112)
Creatinine, Ser: 0.74 mg/dL (ref 0.40–1.50)
GFR: 110.45 mL/min (ref >60.00)
Glucose, Bld: 127 mg/dL — ABNORMAL HIGH (ref 70–99)
POTASSIUM: 4 meq/L (ref 3.5–5.1)
SODIUM: 136 meq/L (ref 135–145)

## 2017-05-10 LAB — LIPID PANEL
CHOL/HDL RATIO: 2
CHOLESTEROL: 197 mg/dL (ref 0–200)
HDL: 103.6 mg/dL (ref >39.00)
LDL CALC: 75 mg/dL (ref 0–99)
NONHDL: 93.24
Triglycerides: 89 mg/dL (ref 0.0–149.0)
VLDL: 17.8 mg/dL (ref 0.0–40.0)

## 2017-05-10 LAB — HEMOGLOBIN A1C: Hgb A1c MFr Bld: 6.5 % (ref 4.6–6.5)

## 2017-05-10 MED ORDER — ROFLUMILAST 500 MCG PO TABS: 500.0000 ug | ORAL_TABLET | Freq: Every day | ORAL | 1 refills | Status: DC

## 2017-05-10 NOTE — Progress Notes (Signed)
Subjective:  Patient ID: Joshua Cross, male    DOB: May 20, 1945  Age: 72 y.o. MRN: [id number]  CC: COPD   HPI Joshua Cross presents for COPD F/up -he was seen in the ED about 10 days ago for a COPD exacerbation.  He was found to have a pulse ox down into the 60s.  He was placed on continuous oxygen and over the last 10 days he has improved.  He feels short of breath when he does not use the oxygen.  He states his cough has dissipated and he denies wheezing, hemoptysis, chest pain, fever, or chills.  He had to be treated with steroids and a fluoroquinolone.  Outpatient Medications Prior to Visit  Medication Sig Dispense Refill  . albuterol (PROVENTIL HFA;VENTOLIN HFA) 108 (90 Base) MCG/ACT inhaler Inhale 2 puffs into the lungs every 6 (six) hours as needed for wheezing or shortness of breath. 1 Inhaler 2  . arformoterol (BROVANA) 15 MCG/2ML NEBU Take 2 mLs (15 mcg total) by nebulization 2 (two) times daily. 120 mL 11  . aspirin EC 81 MG tablet Take 81 mg by mouth daily.    . budesonide (PULMICORT) 0.5 MG/2ML nebulizer solution Take 2 mLs (0.5 mg total) by nebulization 2 (two) times daily. 120 mL 11  . cholecalciferol (VITAMIN D) 1000 units tablet Take 2,000 Units by mouth daily.    . CHOLINE PO Take 2 tablets by mouth daily.    . Glycopyrrolate (LONHALA MAGNAIR REFILL KIT) 25 MCG/ML SOLN Inhale 1 Act into the lungs 2 (two) times daily. 60 mL 11  . Glycopyrrolate (LONHALA MAGNAIR STARTER KIT) 25 MCG/ML SOLN Inhale 1 Act into the lungs 2 (two) times daily. 60 mL 1  . ipratropium (ATROVENT) 0.03 % nasal spray Place 2 sprays into both nostrils every 12 (twelve) hours. 30 mL 11  . LUMIGAN 0.01 % SOLN Instill 1 drop into both eyes once daily at bedtime  0  . magnesium gluconate (MAGONATE) 500 MG tablet Take 500 mg by mouth daily.    . Melatonin 10 MG CAPS Take 10 mg by mouth daily.     . Misc Natural Products (TART CHERRY ADVANCED PO) Take 1 tablet by mouth daily.     . Omega-3 Fatty  Acids (FISH OIL) 1200 MG CAPS Take 1,200 mg by mouth 2 (two) times daily.     Marland Kitchen PARoxetine (PAXIL) 20 MG tablet Take 1 tablet (20 mg total) by mouth daily. 90 tablet 3  . Potassium 99 MG TABS Take 1 tablet by mouth daily.     . predniSONE (DELTASONE) 20 MG tablet take 2 tablets by mouth daily with BREAKFAST for 5 days  0  . Probiotic Product (PROBIOTIC ADVANCED PO) Take 1 capsule by mouth daily. From deep roots     . rosuvastatin (CRESTOR) 10 MG tablet Take 1 tablet (10 mg total) by mouth daily. 90 tablet 1  . TURMERIC PO Take 1 tablet by mouth daily.     . Fluticasone-Umeclidin-Vilant (TRELEGY ELLIPTA) 100-62.5-25 MCG/INH AEPB Inhale 1 Act into the lungs daily. (Patient not taking: Reported on 04/30/2017) 28 each 11  . predniSONE (DELTASONE) 10 MG tablet Take 1 tablet (10 mg total) by mouth daily with breakfast. 4 tablets for 3 days, 3 tablets for 3 days, 2 tablets for 3 days, 1 tablet for 3 days. 30 tablet 0  . Roflumilast (DALIRESP) 250 MCG TABS Take 1 tablet by mouth daily. 30 tablet 11   No facility-administered medications prior to visit.  ROS Review of Systems  Constitutional: Negative.  Negative for chills, diaphoresis, fatigue and fever.  HENT: Negative.  Negative for sore throat and trouble swallowing.   Eyes: Negative.   Respiratory: Positive for shortness of breath. Negative for cough, chest tightness and wheezing.   Cardiovascular: Negative for chest pain, palpitations and leg swelling.  Gastrointestinal: Negative for abdominal pain, constipation, diarrhea, nausea and vomiting.  Endocrine: Negative.   Genitourinary: Negative.  Negative for difficulty urinating.  Musculoskeletal: Negative.  Negative for arthralgias and myalgias.  Skin: Negative.  Negative for color change.  Allergic/Immunologic: Negative.   Neurological: Negative.  Negative for dizziness and weakness.  Hematological: Negative for adenopathy. Does not bruise/bleed easily.  Psychiatric/Behavioral: Negative.       Objective:  BP (!) 142/80 (BP Location: Left Arm, Patient Position: Sitting, Cuff Size: Normal)   Pulse 76   Temp 97.7 F (36.5 C) (Oral)   Resp 20   Ht 5' 7" (1.702 m)   Wt 130 lb (59 kg)   SpO2 (!) 89% Comment: see 6 minute walk test  BMI 20.36 kg/m   BP Readings from Last 3 Encounters:  05/10/17 (!) 142/80  04/30/17 121/60  01/18/17 140/77    Wt Readings from Last 3 Encounters:  05/10/17 130 lb (59 kg)  01/18/17 131 lb (59.4 kg)  01/02/17 131 lb 12 oz (59.8 kg)    Physical Exam  Constitutional: He is oriented to person, place, and time. No distress.  HENT:  Mouth/Throat: Oropharynx is clear and moist. No oropharyngeal exudate.  Eyes: Conjunctivae are normal. Left eye exhibits no discharge. No scleral icterus.  Neck: Normal range of motion. Neck supple. No JVD present. No thyromegaly present.  Cardiovascular: Normal rate, regular rhythm and normal heart sounds. Exam reveals no gallop and no friction rub.  No murmur heard. Pulmonary/Chest: Effort normal. No accessory muscle usage. No tachypnea. No respiratory distress. He has decreased breath sounds in the right upper field, the right middle field, the right lower field, the left upper field, the left middle field and the left lower field. He has no wheezes. He has rhonchi in the right middle field and the left middle field. He has no rales.  6 minute walk test without oxygen started at 1:25pm on Wednesday 05/10/2017  01:26pm 1 minute: 95% oxygen room air; pulse 86bpm  01:27pm 2 minute: 94% oxygen room air; pulse 87bpm  01:28pm 3 minute: 93% oxygen room air; pulse 87bpm  01:29pm 4 minute: 92% oxygen room air; pulse 88bpm  01:30pm 5 minute: 90% oxygen room air; pulse 94bpm  01:31pm 6 minute: 87% oxygen room air; pulse 96bpm  Abdominal: Soft. Bowel sounds are normal. He exhibits no distension and no mass. There is no tenderness.  Musculoskeletal: Normal range of motion. He exhibits no edema, tenderness or  deformity.  Lymphadenopathy:    He has no cervical adenopathy.  Neurological: He is alert and oriented to person, place, and time.  Skin: Skin is warm and dry. No rash noted. He is not diaphoretic. No erythema. No pallor.  Vitals reviewed.   Lab Results  Component Value Date   WBC 9.0 04/30/2017   HGB 15.8 04/30/2017   HCT 45.8 04/30/2017   PLT 308 04/30/2017   GLUCOSE 127 (H) 05/10/2017   CHOL 197 05/10/2017   TRIG 89.0 05/10/2017   HDL 103.60 05/10/2017   LDLCALC 75 05/10/2017   ALT 20 12/06/2016   AST 26 12/06/2016   NA 136 05/10/2017   K 4.0 05/10/2017  CL 100 05/10/2017   CREATININE 0.74 05/10/2017   BUN 13 05/10/2017   CO2 27 05/10/2017   HGBA1C 6.5 05/10/2017    Dg Chest 2 View  Result Date: 04/30/2017 CLINICAL DATA:  Shortness of breath history of COPD EXAM: CHEST  2 VIEW COMPARISON:  01/02/2017, CT chest 12/06/2016 FINDINGS: Hyperinflation with emphysematous disease. No acute consolidation or effusion. Stable cardiomediastinal silhouette. Postsurgical changes in the right upper lung. Degenerative changes of the spine. No pneumothorax. IMPRESSION: No active cardiopulmonary disease. Hyperinflation with emphysematous disease. Electronically Signed   By: Donavan Foil M.D.   On: 04/30/2017 18:00    Assessment & Plan:   Joshua Cross was seen today for copd.  Diagnoses and all orders for this visit:  Mixed simple and mucopurulent chronic bronchitis ([place])- He has low pulse ox saturation at rest and with activity.  I have recommended continuous O2 therapy at 2 L/min.  Will increase the Daliresp to the therapeutic dose to reduce the risk of COPD exacerbation. -     roflumilast (DALIRESP) 500 MCG TABS tablet; Take 1 tablet (500 mcg total) by mouth daily. -     6 minute walk -     Ambulatory referral to Home Health  Hyperglycemia- His A1c is up to 6.5%.  He has mild, new onset type 2 diabetes mellitus.  Medical therapy is not indicated. -     Basic metabolic panel; Future -      Hemoglobin A1c; Future  Hyperlipidemia with target LDL less than 130- He has achieved his LDL goal and is doing well on the statin. -     Lipid panel; Future -     Thyroid Panel With TSH; Future  Need for hepatitis C screening test -     Hepatitis C antibody; Future   I have discontinued Jasmine Awe [place]'s Fluticasone-Umeclidin-Vilant and Roflumilast. I am also having him start on roflumilast. Additionally, I am having him maintain his LUMIGAN, Probiotic Product (PROBIOTIC ADVANCED PO), magnesium gluconate, Potassium, TURMERIC PO, Misc Natural Products (TART CHERRY ADVANCED PO), Fish Oil, aspirin EC, Melatonin, albuterol, ipratropium, PARoxetine, Glycopyrrolate, Glycopyrrolate, arformoterol, budesonide, cholecalciferol, CHOLINE PO, rosuvastatin, and predniSONE.  Meds ordered this encounter  Medications  . roflumilast (DALIRESP) 500 MCG TABS tablet    Sig: Take 1 tablet (500 mcg total) by mouth daily.    Dispense:  90 tablet    Refill:  1     Follow-up: Return in about 3 months (around 08/08/2017).  Scarlette Calico, MD

## 2017-05-10 NOTE — Patient Instructions (Signed)

## 2017-05-11 ENCOUNTER — Encounter: Payer: Self-pay | Admitting: Internal Medicine

## 2017-05-11 LAB — THYROID PANEL WITH TSH
Free Thyroxine Index: 3.8 (ref 1.4–3.8)
T3 Uptake: 34 % (ref 22–35)
T4 TOTAL: 11.1 ug/dL — AB (ref 4.9–10.5)
TSH: 1.94 mIU/L (ref 0.40–4.50)

## 2017-05-11 LAB — HEPATITIS C ANTIBODY
HEP C AB: NONREACTIVE
SIGNAL TO CUT-OFF: 0.01 (ref <1.00)

## 2017-08-09 ENCOUNTER — Ambulatory Visit: Payer: Medicare Other | Admitting: Internal Medicine

## 2017-08-09 ENCOUNTER — Encounter: Payer: Self-pay | Admitting: Internal Medicine

## 2017-08-09 VITALS — BP 130/70 | HR 67 | Temp 97.8°F | Resp 20 | Ht 67.0 in | Wt 124.0 lb

## 2017-08-09 DIAGNOSIS — J988 Other specified respiratory disorders: Secondary | ICD-10-CM

## 2017-08-09 DIAGNOSIS — J418 Mixed simple and mucopurulent chronic bronchitis: Secondary | ICD-10-CM | POA: Diagnosis not present

## 2017-08-09 MED ORDER — CEFDINIR 300 MG PO CAPS: 300.0000 mg | ORAL_CAPSULE | Freq: Two times a day (BID) | ORAL | 0 refills | Status: AC

## 2017-08-09 NOTE — Patient Instructions (Signed)
Cough, Adult  Coughing is a reflex that clears your throat and your airways. Coughing helps to heal and protect your lungs. It is normal to cough occasionally, but a cough that happens with other symptoms or lasts a long time may be a sign of a condition that needs treatment. A cough may last only 2-3 weeks (acute), or it may last longer than 8 weeks (chronic).  What are the causes?  Coughing is commonly caused by:   Breathing in substances that irritate your lungs.   A viral or bacterial respiratory infection.   Allergies.   Asthma.   Postnasal drip.   Smoking.   Acid backing up from the stomach into the esophagus (gastroesophageal reflux).   Certain medicines.   Chronic lung problems, including COPD (or rarely, lung cancer).   Other medical conditions such as heart failure.    Follow these instructions at home:  Pay attention to any changes in your symptoms. Take these actions to help with your discomfort:   Take medicines only as told by your health care provider.  ? If you were prescribed an antibiotic medicine, take it as told by your health care provider. Do not stop taking the antibiotic even if you start to feel better.  ? Talk with your health care provider before you take a cough suppressant medicine.   Drink enough fluid to keep your urine clear or pale yellow.   If the air is dry, use a cold steam vaporizer or humidifier in your bedroom or your home to help loosen secretions.   Avoid anything that causes you to cough at work or at home.   If your cough is worse at night, try sleeping in a semi-upright position.   Avoid cigarette smoke. If you smoke, quit smoking. If you need help quitting, ask your health care provider.   Avoid caffeine.   Avoid alcohol.   Rest as needed.    Contact a health care provider if:   You have new symptoms.   You cough up pus.   Your cough does not get better after 2-3 weeks, or your cough gets worse.   You cannot control your cough with suppressant  medicines and you are losing sleep.   You develop pain that is getting worse or pain that is not controlled with pain medicines.   You have a fever.   You have unexplained weight loss.   You have night sweats.  Get help right away if:   You cough up blood.   You have difficulty breathing.   Your heartbeat is very fast.  This information is not intended to replace advice given to you by your health care provider. Make sure you discuss any questions you have with your health care provider.  Document Released: 09/24/2010 Document Revised: 09/03/2015 Document Reviewed: 06/04/2014  Elsevier Interactive Patient Education  2018 [employer]

## 2017-08-09 NOTE — Progress Notes (Signed)
Subjective:  Patient ID: Joshua Cross, male    DOB: 15-Jun-1945  Age: 72 y.o. MRN: [id number]  CC: Cough and COPD   HPI Joshua Cross presents for a 1 week hx of cough productive of brown phlegm with mild shortness of breath.  Outpatient Medications Prior to Visit  Medication Sig Dispense Refill  . albuterol (PROVENTIL HFA;VENTOLIN HFA) 108 (90 Base) MCG/ACT inhaler Inhale 2 puffs into the lungs every 6 (six) hours as needed for wheezing or shortness of breath. 1 Inhaler 2  . arformoterol (BROVANA) 15 MCG/2ML NEBU Take 2 mLs (15 mcg total) by nebulization 2 (two) times daily. 120 mL 11  . budesonide (PULMICORT) 0.5 MG/2ML nebulizer solution Take 2 mLs (0.5 mg total) by nebulization 2 (two) times daily. 120 mL 11  . cholecalciferol (VITAMIN D) 1000 units tablet Take 2,000 Units by mouth daily.    . CHOLINE PO Take 2 tablets by mouth daily.    . Glycopyrrolate (LONHALA MAGNAIR REFILL KIT) 25 MCG/ML SOLN Inhale 1 Act into the lungs 2 (two) times daily. 60 mL 11  . Glycopyrrolate (LONHALA MAGNAIR STARTER KIT) 25 MCG/ML SOLN Inhale 1 Act into the lungs 2 (two) times daily. 60 mL 1  . ipratropium (ATROVENT) 0.03 % nasal spray Place 2 sprays into both nostrils every 12 (twelve) hours. 30 mL 11  . LUMIGAN 0.01 % SOLN Instill 1 drop into both eyes once daily at bedtime  0  . magnesium gluconate (MAGONATE) 500 MG tablet Take 500 mg by mouth daily.    . Melatonin 10 MG CAPS Take 10 mg by mouth daily.     . Misc Natural Products (TART CHERRY ADVANCED PO) Take 1 tablet by mouth daily.     . Omega-3 Fatty Acids (FISH OIL) 1200 MG CAPS Take 1,200 mg by mouth 2 (two) times daily.     Marland Kitchen PARoxetine (PAXIL) 20 MG tablet Take 1 tablet (20 mg total) by mouth daily. 90 tablet 3  . Potassium 99 MG TABS Take 1 tablet by mouth daily.     . Probiotic Product (PROBIOTIC ADVANCED PO) Take 1 capsule by mouth daily. From deep roots     . roflumilast (DALIRESP) 500 MCG TABS tablet Take 1 tablet (500 mcg  total) by mouth daily. 90 tablet 1  . rosuvastatin (CRESTOR) 10 MG tablet Take 1 tablet (10 mg total) by mouth daily. 90 tablet 1  . TURMERIC PO Take 1 tablet by mouth daily.     Marland Kitchen aspirin EC 81 MG tablet Take 81 mg by mouth daily.    . predniSONE (DELTASONE) 20 MG tablet take 2 tablets by mouth daily with BREAKFAST for 5 days  0   No facility-administered medications prior to visit.     ROS Review of Systems  Constitutional: Negative.  Negative for chills, fatigue and fever.  HENT: Negative.  Negative for sore throat and trouble swallowing.   Eyes: Negative.   Respiratory: Positive for cough and shortness of breath. Negative for chest tightness, wheezing and stridor.   Cardiovascular: Negative for chest pain, palpitations and leg swelling.  Gastrointestinal: Negative for abdominal pain, constipation, diarrhea, nausea and vomiting.  Endocrine: Negative.   Genitourinary: Negative.  Negative for difficulty urinating and urgency.  Musculoskeletal: Negative.  Negative for arthralgias, joint swelling and myalgias.  Skin: Negative.  Negative for color change, pallor and rash.  Allergic/Immunologic: Negative.   Neurological: Negative.  Negative for dizziness, weakness and light-headedness.  Hematological: Negative for adenopathy. Does not bruise/bleed easily.  Psychiatric/Behavioral: Negative.     Objective:  BP 130/70 (BP Location: Left Arm, Patient Position: Sitting, Cuff Size: Normal)   Pulse 67   Temp 97.8 F (36.6 C) (Oral)   Resp 20   Ht _0  (1.702 m)   Wt 124 lb (56.2 kg)   SpO2 92%   BMI 19.42 kg/m   BP Readings from Last 3 Encounters:  08/09/17 130/70  05/10/17 (!) 142/80  04/30/17 121/60    Wt Readings from Last 3 Encounters:  08/09/17 124 lb (56.2 kg)  05/10/17 130 lb (59 kg)  01/18/17 131 lb (59.4 kg)    Physical Exam  Constitutional: He is oriented to person, place, and time.  Non-toxic appearance. He does not have a sickly appearance. He does not appear  ill. No distress.  HENT:  Mouth/Throat: Oropharynx is clear and moist. No oropharyngeal exudate.  Eyes: Conjunctivae are normal. No scleral icterus.  Neck: Normal range of motion. Neck supple. No JVD present.  Cardiovascular: Normal rate, regular rhythm and normal heart sounds. Exam reveals no gallop and no friction rub.  No murmur heard. Pulmonary/Chest: Effort normal and breath sounds normal. No stridor. No respiratory distress. He has no wheezes. He has no rales.  Abdominal: Soft. Bowel sounds are normal. He exhibits no mass. There is no tenderness.  Musculoskeletal: Normal range of motion. He exhibits no edema, tenderness or deformity.  Lymphadenopathy:    He has no cervical adenopathy.  Neurological: He is alert and oriented to person, place, and time.  Skin: Skin is warm and dry. No rash noted. He is not diaphoretic.  Vitals reviewed.   Lab Results  Component Value Date   WBC 9.0 04/30/2017   HGB 15.8 04/30/2017   HCT 45.8 04/30/2017   PLT 308 04/30/2017   GLUCOSE 127 (H) 05/10/2017   CHOL 197 05/10/2017   TRIG 89.0 05/10/2017   HDL 103.60 05/10/2017   LDLCALC 75 05/10/2017   ALT 20 12/06/2016   AST 26 12/06/2016   NA 136 05/10/2017   K 4.0 05/10/2017   CL 100 05/10/2017   CREATININE 0.74 05/10/2017   BUN 13 05/10/2017   CO2 27 05/10/2017   TSH 1.94 05/10/2017   HGBA1C 6.5 05/10/2017    Dg Chest 2 View  Result Date: 04/30/2017 CLINICAL DATA:  Shortness of breath history of COPD EXAM: CHEST  2 VIEW COMPARISON:  01/02/2017, CT chest 12/06/2016 FINDINGS: Hyperinflation with emphysematous disease. No acute consolidation or effusion. Stable cardiomediastinal silhouette. Postsurgical changes in the right upper lung. Degenerative changes of the spine. No pneumothorax. IMPRESSION: No active cardiopulmonary disease. Hyperinflation with emphysematous disease. Electronically Signed   By: Donavan Foil M.D.   On: 04/30/2017 18:00   6 minute walk test without oxygen started at  10:45am on Wednesday 08/09/2017 10:45am 1 minute: 94% oxygen room air; pulse 81bpm  10:46am 2 minute: 89% oxygen room air; pulse 84bpm  10:47am 3 minute: 86% oxygen room air; pulse 88bpm  10:48am 4 minute: 84% oxygen room air; pulse 89bpm  10:49am 5 minute: 80% oxygen room air; pulse 90bpm  10:50am 6 minute: 78% oxygen room air; pulse 97bpm     Assessment & Plan:   Johnta was seen today for cough and copd.  Diagnoses and all orders for this visit:  RTI (respiratory tract infection)- I will treat the infection with cefdinir -     cefdinir (OMNICEF) 300 MG capsule; Take 1 capsule (300 mg total) by mouth 2 (two) times daily for 10 days.  Mixed  simple and mucopurulent chronic bronchitis ([place])- I think he would benefit from portable oxygen -     cefdinir (OMNICEF) 300 MG capsule; Take 1 capsule (300 mg total) by mouth 2 (two) times daily for 10 days. -     PR PORTABLE OXYGEN CONCENTRATOR -     Care order/instruction -     For home use only DME oxygen   I have discontinued Jasmine Awe. [place]'s aspirin EC and predniSONE. I am also having him start on cefdinir. Additionally, I am having him maintain his LUMIGAN, Probiotic Product (PROBIOTIC ADVANCED PO), magnesium gluconate, Potassium, TURMERIC PO, Misc Natural Products (TART CHERRY ADVANCED PO), Fish Oil, Melatonin, albuterol, ipratropium, PARoxetine, Glycopyrrolate, Glycopyrrolate, arformoterol, budesonide, cholecalciferol, CHOLINE PO, rosuvastatin, and roflumilast.  Meds ordered this encounter  Medications  . cefdinir (OMNICEF) 300 MG capsule    Sig: Take 1 capsule (300 mg total) by mouth 2 (two) times daily for 10 days.    Dispense:  20 capsule    Refill:  0     Follow-up: Return if symptoms worsen or fail to improve.  Scarlette Calico, MD

## 2017-09-11 ENCOUNTER — Telehealth: Payer: Self-pay | Admitting: Emergency Medicine

## 2017-09-11 NOTE — Telephone Encounter (Signed)
Called patient to schedule AWV. Patient declined at this time. 

## 2017-10-13 ENCOUNTER — Other Ambulatory Visit: Payer: Self-pay | Admitting: Internal Medicine

## 2017-10-13 DIAGNOSIS — J418 Mixed simple and mucopurulent chronic bronchitis: Secondary | ICD-10-CM

## 2017-10-23 ENCOUNTER — Other Ambulatory Visit: Payer: Self-pay | Admitting: *Deleted

## 2017-10-23 MED ORDER — ALBUTEROL SULFATE HFA 108 (90 BASE) MCG/ACT IN AERS: 2.0000 | INHALATION_SPRAY | Freq: Four times a day (QID) | RESPIRATORY_TRACT | 2 refills | Status: DC | PRN

## 2017-10-24 ENCOUNTER — Encounter: Payer: Self-pay | Admitting: Internal Medicine

## 2017-11-03 ENCOUNTER — Other Ambulatory Visit: Payer: Self-pay | Admitting: Internal Medicine

## 2017-11-03 DIAGNOSIS — E7800 Pure hypercholesterolemia, unspecified: Secondary | ICD-10-CM

## 2017-11-15 ENCOUNTER — Ambulatory Visit (INDEPENDENT_AMBULATORY_CARE_PROVIDER_SITE_OTHER): Payer: Medicare Other | Admitting: *Deleted

## 2017-11-15 VITALS — BP 112/68 | HR 69 | Resp 17 | Ht 67.0 in | Wt 124.0 lb

## 2017-11-15 DIAGNOSIS — Z0000 Encounter for general adult medical examination without abnormal findings: Secondary | ICD-10-CM | POA: Diagnosis not present

## 2017-11-15 NOTE — Patient Instructions (Addendum)
[facility] in [place], [place] in: [place] Address: [address], [place], [place] [address] Phone: [phone]  [facility] [place] Speech pathologist in [place], [place] Address: [address], [place], [place] [address] Phone: [phone]  [email or web address] or down load app on smart phone  Aunt Berenice Primas website lists multiple social resources for individuals such as: food, health, money, house hold goods, transit, medical supplies, job training and legal services.  Continue doing brain stimulating activities (puzzles, reading, adult coloring books, staying active) to keep memory sharp.   Continue to eat heart healthy diet (full of fruits, vegetables, whole grains, lean protein, water--limit salt, fat, and sugar intake) and increase physical activity as tolerated.   Joshua Cross , Thank you for taking time to come for your Medicare Wellness Visit. I appreciate your ongoing commitment to your health goals. Please review the following plan we discussed and let me know if I can assist you in the future.   These are the goals we discussed: Goals    . Patient Stated     Continue to be socially engaged with people, enjoy life, family and friends. Enjoy staying updated by reading my papers.         This is a list of the screening recommended for you and due dates:  Health Maintenance  Topic Date Due  . Flu Shot  11/09/2017  . Colon Cancer Screening  01/19/2020  . Tetanus Vaccine  05/26/2025  .  Hepatitis C: One time screening is recommended by Center for Disease Control  (CDC) for  adults born from [birth year] through 1966.   Completed  . Pneumonia vaccines  Completed     Health Maintenance, Male A healthy lifestyle and preventive care is important for your health and wellness. Ask your health care provider about what schedule of regular examinations is right for you. What should I know about weight and  diet? Eat a Healthy Diet  Eat plenty of vegetables, fruits, whole grains, low-fat dairy products, and lean protein.  Do not eat a lot of foods high in solid fats, added sugars, or salt.  Maintain a Healthy Weight Regular exercise can help you achieve or maintain a healthy weight. You should:  Do at least 150 minutes of exercise each week. The exercise should increase your heart rate and make you sweat (moderate-intensity exercise).  Do strength-training exercises at least twice a week.  Watch Your Levels of Cholesterol and Blood Lipids  Have your blood tested for lipids and cholesterol every 5 years starting at 73 years of age. If you are at high risk for heart disease, you should start having your blood tested when you are 73 years old. You may need to have your cholesterol levels checked more often if: ? Your lipid or cholesterol levels are high. ? You are older than 73 years of age. ? You are at high risk for heart disease.  What should I know about cancer screening? Many types of cancers can be detected early and may often be prevented. Lung Cancer  You should be screened every year for lung cancer if: ? You are a current smoker who has smoked for at least 30 years. ? You are a former smoker who has quit within the past 15 years.  Talk to your health care provider about your screening options, when you should start screening, and how often you should be screened.  Colorectal Cancer  Routine colorectal cancer screening usually  begins at 73 years of age and should be repeated every 5-10 years until you are 73 years old. You may need to be screened more often if early forms of precancerous polyps or small growths are found. Your health care provider may recommend screening at an earlier age if you have risk factors for colon cancer.  Your health care provider may recommend using home test kits to check for hidden blood in the stool.  A small camera at the end of a tube can be  used to examine your colon (sigmoidoscopy or colonoscopy). This checks for the earliest forms of colorectal cancer.  Prostate and Testicular Cancer  Depending on your age and overall health, your health care provider may do certain tests to screen for prostate and testicular cancer.  Talk to your health care provider about any symptoms or concerns you have about testicular or prostate cancer.  Skin Cancer  Check your skin from head to toe regularly.  Tell your health care provider about any new moles or changes in moles, especially if: ? There is a change in a mole's size, shape, or color. ? You have a mole that is larger than a pencil eraser.  Always use sunscreen. Apply sunscreen liberally and repeat throughout the day.  Protect yourself by wearing long sleeves, pants, a wide-brimmed hat, and sunglasses when outside.  What should I know about heart disease, diabetes, and high blood pressure?  If you are 47-65 years of age, have your blood pressure checked every 3-5 years. If you are 53 years of age or older, have your blood pressure checked every year. You should have your blood pressure measured twice-once when you are at a hospital or clinic, and once when you are not at a hospital or clinic. Record the average of the two measurements. To check your blood pressure when you are not at a hospital or clinic, you can use: ? An automated blood pressure machine at a pharmacy. ? A home blood pressure monitor.  Talk to your health care provider about your target blood pressure.  If you are between 53-22 years old, ask your health care provider if you should take aspirin to prevent heart disease.  Have regular diabetes screenings by checking your fasting blood sugar level. ? If you are at a normal weight and have a low risk for diabetes, have this test once every three years after the age of 66. ? If you are overweight and have a high risk for diabetes, consider being tested at a younger  age or more often.  A one-time screening for abdominal aortic aneurysm (AAA) by ultrasound is recommended for men aged 25-75 years who are current or former smokers. What should I know about preventing infection? Hepatitis B If you have a higher risk for hepatitis B, you should be screened for this virus. Talk with your health care provider to find out if you are at risk for hepatitis B infection. Hepatitis C Blood testing is recommended for:  Everyone born from [birth year] through 1965.  Anyone with known risk factors for hepatitis C.  Sexually Transmitted Diseases (STDs)  You should be screened each year for STDs including gonorrhea and chlamydia if: ? You are sexually active and are younger than 73 years of age. ? You are older than 73 years of age and your health care provider tells you that you are at risk for this type of infection. ? Your sexual activity has changed since you were last screened and you  are at an increased risk for chlamydia or gonorrhea. Ask your health care provider if you are at risk.  Talk with your health care provider about whether you are at high risk of being infected with HIV. Your health care provider may recommend a prescription medicine to help prevent HIV infection.  What else can I do?  Schedule regular health, dental, and eye exams.  Stay current with your vaccines (immunizations).  Do not use any tobacco products, such as cigarettes, chewing tobacco, and e-cigarettes. If you need help quitting, ask your health care provider.  Limit alcohol intake to no more than 2 drinks per day. One drink equals 12 ounces of beer, 5 ounces of Tandy Grawe, or 1 ounces of hard liquor.  Do not use street drugs.  Do not share needles.  Ask your health care provider for help if you need support or information about quitting drugs.  Tell your health care provider if you often feel depressed.  Tell your health care provider if you have ever been abused or do not feel safe  at home. This information is not intended to replace advice given to you by your health care provider. Make sure you discuss any questions you have with your health care provider. Document Released: 09/24/2007 Document Revised: 11/25/2015 Document Reviewed: 12/30/2014 Elsevier Interactive Patient Education  [employer].

## 2017-11-15 NOTE — Progress Notes (Addendum)
Subjective:   Joshua Cross is a 72 y.o. male who presents for an Initial Medicare Annual Wellness Visit.  Review of Systems  No ROS.  Medicare Wellness Visit. Additional risk factors are reflected in the social history.  Cardiac Risk Factors include: advanced age (>59mn, >>16women);dyslipidemia;male gender Sleep patterns: feels rested on waking, gets up 1 times nightly to void and sleeps 7-8 hours nightly.    Home Safety/Smoke Alarms: Feels safe in home. Smoke alarms in place.  Living environment; residence and Firearm Safety: 1-story house/ trailer, no firearms. Lives alone, no needs for DME, good support system Seat Belt Safety/Bike Helmet: Wears seat belt.   Objective:    Today's Vitals   11/15/17 1327  BP: 112/68  Pulse: 69  Resp: 17  SpO2: 96%  Weight: 124 lb (56.2 kg)  Height: _0  (1.702 m)   Body mass index is 19.42 kg/m.  Advanced Directives 11/15/2017 04/30/2017 12/07/2016 11/24/2016  Does Patient Have a Medical Advance Directive? No No No No  Would patient like information on creating a medical advance directive? Yes (ED - Information included in AVS) - No - Patient declined -    Current Medications (verified) Outpatient Encounter Medications as of 11/15/2017  Medication Sig  . albuterol (PROVENTIL HFA;VENTOLIN HFA) 108 (90 Base) MCG/ACT inhaler Inhale 2 puffs into the lungs every 6 (six) hours as needed for wheezing or shortness of breath.  .Marland Kitchenarformoterol (BROVANA) 15 MCG/2ML NEBU Take 2 mLs (15 mcg total) by nebulization 2 (two) times daily.  . budesonide (PULMICORT) 0.5 MG/2ML nebulizer solution Take 2 mLs (0.5 mg total) by nebulization 2 (two) times daily.  . cholecalciferol (VITAMIN D) 1000 units tablet Take 2,000 Units by mouth daily.  . CHOLINE PO Take 2 tablets by mouth daily.  .Marland KitchenDALIRESP 500 MCG TABS tablet TAKE 1 TABLET BY MOUTH ONCE DAILY  . Glycopyrrolate (LONHALA MAGNAIR REFILL KIT) 25 MCG/ML SOLN Inhale 1 Act into the lungs 2 (two) times daily.    . Glycopyrrolate (LONHALA MAGNAIR STARTER KIT) 25 MCG/ML SOLN Inhale 1 Act into the lungs 2 (two) times daily.  .Marland Kitchenipratropium (ATROVENT) 0.03 % nasal spray Place 2 sprays into both nostrils every 12 (twelve) hours.  .Marland KitchenLUMIGAN 0.01 % SOLN Instill 1 drop into both eyes once daily at bedtime  . magnesium gluconate (MAGONATE) 500 MG tablet Take 500 mg by mouth daily.  . Melatonin 10 MG CAPS Take 10 mg by mouth daily.   . Misc Natural Products (TART CHERRY ADVANCED PO) Take 1 tablet by mouth daily.   . Omega-3 Fatty Acids (FISH OIL) 1200 MG CAPS Take 1,200 mg by mouth 2 (two) times daily.   .Marland KitchenPARoxetine (PAXIL) 20 MG tablet Take 1 tablet (20 mg total) by mouth daily.  . Potassium 99 MG TABS Take 1 tablet by mouth daily.   . Probiotic Product (PROBIOTIC ADVANCED PO) Take 1 capsule by mouth daily. From deep roots   . rosuvastatin (CRESTOR) 10 MG tablet TAKE 1 TABLET BY MOUTH ONCE DAILY  . TURMERIC PO Take 1 tablet by mouth daily.    No facility-administered encounter medications on file as of 11/15/2017.     Allergies (verified) Erythromycin and Penicillins   History: Past Medical History:  Diagnosis Date  . COPD [place] obstructive pulmonary disease) ([place]   . Hyperlipidemia    Past Surgical History:  Procedure Laterality Date  . HERNIA REPAIR    . LUNG REMOVAL, PARTIAL Right 1985  . TONSILLECTOMY  Family History  Problem Relation Age of Onset  . Alcohol abuse Mother   . Stroke Father   . Depression Sister   . Heart disease Sister   . Cancer Neg Hx   . COPD Neg Hx   . Early death Neg Hx   . Kidney disease Neg Hx   . Hypertension Neg Hx   . Learning disabilities Neg Hx    Social History   Socioeconomic History  . Marital status: Divorced    Spouse name: Not on file  . Number of children: Not on file  . Years of education: Not on file  . Highest education level: Not on file  Occupational History  . Occupation: retired  Scientific laboratory technician  . Financial resource strain: Not  hard at all  . Food insecurity:    Worry: Never true    Inability: Never true  . Transportation needs:    Medical: No    Non-medical: No  Tobacco Use  . Smoking status: Former Smoker    Types: Cigarettes    Last attempt to quit: 08/07/2006    Years since quitting: 11.2  . Smokeless tobacco: Never Used  Substance and Sexual Activity  . Alcohol use: No  . Drug use: No  . Sexual activity: Not Currently  Lifestyle  . Physical activity:    Days per week: 3 days    Minutes per session: 40 min  . Stress: Only a little  Relationships  . Social connections:    Talks on phone: More than three times a week    Gets together: More than three times a week    Attends religious service: Not on file    Active member of club or organization: Not on file    Attends meetings of clubs or organizations: Not on file    Relationship status: Not on file  Other Topics Concern  . Not on file  Social History Narrative   Patient is from [place], moved to [place] to attend school at [employer], then started work [employer] studies at college in [place]. 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 He is an avid reader and active in [place].   Tobacco Counseling Counseling given: Not Answered  Activities of Daily Living In your present state of health, do you have any difficulty performing the following activities: 11/15/2017 12/07/2016  Hearing? N N  Vision? N N  Difficulty concentrating or making decisions? N N  Walking or climbing stairs? N N  Dressing or bathing? N N  Doing errands, shopping? N N  Preparing Food and eating ? N -  Using the Toilet? N -  In the past six months, have you accidently leaked urine? N -  Do you have problems with loss of bowel control? N -  Managing your Medications? N -  Managing your Finances? N -  Housekeeping or managing your Housekeeping? N -  Some recent data might be hidden     Immunizations and Health Maintenance Immunization History    Administered Date(s) Administered  . DTaP 03/07/2016  . Influenza-Unspecified 03/07/2016, 12/15/2016  . Pneumococcal Conjugate-13 07/27/2013  . Pneumococcal Polysaccharide-23 02/12/2010  . Pneumococcal-Unspecified 03/08/2015  . Tdap 05/27/2015  . Zoster 03/08/2011   Health Maintenance Due  Topic Date Due  . INFLUENZA VACCINE  11/09/2017    Patient Care Team: Janith Lima, MD as PCP - General (Internal Medicine)  Indicate any recent Medical Services you may have received from other than [facility]  providers in the past year (date may be approximate).    Assessment:   This is a routine wellness examination for Joshua Cross. Physical assessment deferred to PCP.   Hearing/Vision screen Hearing Screening Comments: Able to hear conversational tones w/o difficulty. No issues reported.  Passed whisper test Vision Screening Comments: appointment yearly Dr. Radonna Ricker, Dr. Brigitte Pulse  Dietary issues and exercise activities discussed: Current Exercise Habits: Home exercise routine, Type of exercise: walking, Time (Minutes): 45, Frequency (Times/Week): 4, Weekly Exercise (Minutes/Week): 180, Intensity: Mild, Exercise limited by: None identified  Diet (meal preparation, eat out, water intake, caffeinated beverages, dairy products, fruits and vegetables): in general, a "healthy" diet  , well balanced   Reviewed heart healthy diet. Encouraged patient to increase daily water and healthy fluid intake.  Goals    . Patient Stated     Continue to be socially engaged with people, enjoy life, family and friends. Enjoy staying updated by reading my papers.        Depression Screen PHQ 2/9 Scores 11/15/2017  PHQ - 2 Score 0  PHQ- 9 Score 0    Fall Risk Fall Risk  11/15/2017  Falls in the past year? No    Cognitive Function:       Ad8 score reviewed for issues:  Issues making decisions: no  Less interest in hobbies / activities: no  Repeats questions, stories (family complaining): no  Trouble  using ordinary gadgets (microwave, computer, phone):no  Forgets the month or year: no  Mismanaging finances: no  Remembering appts: no  Daily problems with thinking and/or memory: no Ad8 score is= 0  Screening Tests Health Maintenance  Topic Date Due  . INFLUENZA VACCINE  11/09/2017  . COLONOSCOPY  01/19/2020  . TETANUS/TDAP  05/26/2025  . Hepatitis C Screening  Completed  . PNA vac Low Risk Adult  Completed      Plan:   Continue doing brain stimulating activities (puzzles, reading, adult coloring books, staying active) to keep memory sharp.   Continue to eat heart healthy diet (full of fruits, vegetables, whole grains, lean protein, water--limit salt, fat, and sugar intake) and increase physical activity as tolerated.  I have personally reviewed and noted the following in the patient's chart:   . Medical and social history . Use of alcohol, tobacco or illicit drugs  . Current medications and supplements . Functional ability and status . Nutritional status . Physical activity . Advanced directives . List of other physicians . Vitals . Screenings to include cognitive, depression, and falls . Referrals and appointments  In addition, I have reviewed and discussed with patient certain preventive protocols, quality metrics, and best practice recommendations. A written personalized care plan for preventive services as well as general preventive health recommendations were provided to patient.    Medical screening examination/treatment/procedure(s) were performed by non-physician practitioner and as supervising physician I was immediately available for consultation/collaboration. I agree with above. Scarlette Calico, MD    Michiel Cowboy, RN   11/15/2017

## 2017-12-14 ENCOUNTER — Encounter: Payer: Self-pay | Admitting: Internal Medicine

## 2017-12-15 ENCOUNTER — Encounter: Payer: Self-pay | Admitting: Internal Medicine

## 2017-12-15 ENCOUNTER — Ambulatory Visit (INDEPENDENT_AMBULATORY_CARE_PROVIDER_SITE_OTHER): Payer: Medicare Other | Admitting: Internal Medicine

## 2017-12-15 VITALS — BP 120/76 | HR 80 | Temp 97.8°F | Ht 67.0 in | Wt 125.0 lb

## 2017-12-15 DIAGNOSIS — J301 Allergic rhinitis due to pollen: Secondary | ICD-10-CM | POA: Diagnosis not present

## 2017-12-15 DIAGNOSIS — R739 Hyperglycemia, unspecified: Secondary | ICD-10-CM

## 2017-12-15 DIAGNOSIS — J441 Chronic obstructive pulmonary disease with (acute) exacerbation: Secondary | ICD-10-CM | POA: Diagnosis not present

## 2017-12-15 MED ORDER — LEVOFLOXACIN 500 MG PO TABS: 500.0000 mg | ORAL_TABLET | Freq: Every day | ORAL | 0 refills | Status: AC

## 2017-12-15 MED ORDER — HYDROCODONE-HOMATROPINE 5-1.5 MG/5ML PO SYRP: 5.0000 mL | ORAL_SOLUTION | Freq: Four times a day (QID) | ORAL | 0 refills | Status: AC | PRN

## 2017-12-15 MED ORDER — PREDNISONE 10 MG PO TABS: ORAL_TABLET | ORAL | 0 refills | Status: DC

## 2017-12-15 MED ORDER — METHYLPREDNISOLONE ACETATE 80 MG/ML IJ SUSP
80.0000 mg | Freq: Once | INTRAMUSCULAR | Status: AC
  Administered 2017-12-15: 80 mg via INTRAMUSCULAR

## 2017-12-15 NOTE — Assessment & Plan Note (Signed)
Also to improve with tx above

## 2017-12-15 NOTE — Progress Notes (Signed)
Subjective:    Patient ID: Joshua Cross, male    DOB: 09/23/1945, 72 y.o.   MRN: [id number]  HPI   Here with acute onset mild to mod 2-3 days ST, HA, general weakness and malaise, with prod cough greenish sputum, but Pt denies chest pain, increased sob or doe, wheezing, orthopnea, PND, increased LE swelling, palpitations, dizziness or syncope, except for tightness, wheeziness and sob/doe x 1 day.  No prior hx of dx copd, quit smoking x 14 yrs.  Pt denies new neurological symptoms such as new headache, or facial or extremity weakness or numbness   Pt denies polydipsia, polyuria Past Medical History:  Diagnosis Date  . COPD (chronic obstructive pulmonary disease) ([place])   . Hyperlipidemia    Past Surgical History:  Procedure Laterality Date  . HERNIA REPAIR    . LUNG REMOVAL, PARTIAL Right 1985  . TONSILLECTOMY      reports that he quit smoking about 11 years ago. His smoking use included cigarettes. He has never used smokeless tobacco. He reports that he does not drink alcohol or use drugs. family history includes Alcohol abuse in his mother; Depression in his sister; Heart disease in his sister; Stroke in his father. Allergies  Allergen Reactions  . Erythromycin     REACTION: GI Intolerance  . Penicillins     Has patient had a PCN reaction causing immediate rash, facial/tongue/throat swelling, SOB or lightheadedness with hypotension: Unknown Has patient had a PCN reaction causing severe rash involving mucus membranes or skin necrosis: Unknown Has patient had a PCN reaction that required hospitalization: Unknown Has patient had a PCN reaction occurring within the last 10 years: Unknown If all of the above answers are "NO", then may proceed with Cephalosporin use.    Current Outpatient Medications on File Prior to Visit  Medication Sig Dispense Refill  . albuterol (PROVENTIL HFA;VENTOLIN HFA) 108 (90 Base) MCG/ACT inhaler Inhale 2 puffs into the lungs every 6 (six) hours as  needed for wheezing or shortness of breath. 1 Inhaler 2  . arformoterol (BROVANA) 15 MCG/2ML NEBU Take 2 mLs (15 mcg total) by nebulization 2 (two) times daily. 120 mL 11  . budesonide (PULMICORT) 0.5 MG/2ML nebulizer solution Take 2 mLs (0.5 mg total) by nebulization 2 (two) times daily. 120 mL 11  . cholecalciferol (VITAMIN D) 1000 units tablet Take 2,000 Units by mouth daily.    . CHOLINE PO Take 2 tablets by mouth daily.    Marland Kitchen DALIRESP 500 MCG TABS tablet TAKE 1 TABLET BY MOUTH ONCE DAILY 90 tablet 1  . Glycopyrrolate (LONHALA MAGNAIR REFILL KIT) 25 MCG/ML SOLN Inhale 1 Act into the lungs 2 (two) times daily. 60 mL 11  . Glycopyrrolate (LONHALA MAGNAIR STARTER KIT) 25 MCG/ML SOLN Inhale 1 Act into the lungs 2 (two) times daily. 60 mL 1  . ipratropium (ATROVENT) 0.03 % nasal spray Place 2 sprays into both nostrils every 12 (twelve) hours. 30 mL 11  . LUMIGAN 0.01 % SOLN Instill 1 drop into both eyes once daily at bedtime  0  . magnesium gluconate (MAGONATE) 500 MG tablet Take 500 mg by mouth daily.    . Melatonin 10 MG CAPS Take 10 mg by mouth daily.     . Misc Natural Products (TART CHERRY ADVANCED PO) Take 1 tablet by mouth daily.     . Omega-3 Fatty Acids (FISH OIL) 1200 MG CAPS Take 1,200 mg by mouth 2 (two) times daily.     Marland Kitchen PARoxetine (PAXIL)  20 MG tablet Take 1 tablet (20 mg total) by mouth daily. 90 tablet 3  . Potassium 99 MG TABS Take 1 tablet by mouth daily.     . Probiotic Product (PROBIOTIC ADVANCED PO) Take 1 capsule by mouth daily. From deep roots     . rosuvastatin (CRESTOR) 10 MG tablet TAKE 1 TABLET BY MOUTH ONCE DAILY 90 tablet 1  . TURMERIC PO Take 1 tablet by mouth daily.      No current facility-administered medications on file prior to visit.    Review of Systems  Constitutional: Negative for other unusual diaphoresis or sweats HENT: Negative for ear discharge or swelling Eyes: Negative for other worsening visual disturbances Respiratory: Negative for stridor or  other swelling  Gastrointestinal: Negative for worsening distension or other blood Genitourinary: Negative for retention or other urinary change Musculoskeletal: Negative for other MSK pain or swelling Skin: Negative for color change or other new lesions Neurological: Negative for worsening tremors and other numbness  Psychiatric/Behavioral: Negative for worsening agitation or other fatigue All other system neg per pt    Objective:   Physical Exam BP 120/76   Pulse 80   Temp 97.8 F (36.6 C) (Oral)   Ht 5' 7" (1.702 m)   Wt 125 lb (56.7 kg)   SpO2 94%   BMI 19.58 kg/m  VS noted, mild ill Constitutional: Pt appears in NAD HENT: Head: NCAT.  Right Ear: External ear normal.  Left Ear: External ear normal.  Eyes: . Pupils are equal, round, and reactive to light. Conjunctivae and EOM are normal Bilat tm's with mild erythema.  Max sinus areas non tender.  Pharynx with mild erythema, no exudate Nose: without d/c or deformity Neck: Neck supple. Gross normal ROM Cardiovascular: Normal rate and regular rhythm.   Pulmonary/Chest: Effort normal and breath sounds decreased without rales but with mild bialt scattered wheezing.  Neurological: Pt is alert. At baseline orientation, motor grossly intact Skin: Skin is warm. No rashes, other new lesions, no LE edema Psychiatric: Pt behavior is normal without agitation   Lab Results  Component Value Date   WBC 9.0 04/30/2017   HGB 15.8 04/30/2017   HCT 45.8 04/30/2017   PLT 308 04/30/2017   GLUCOSE 127 (H) 05/10/2017   CHOL 197 05/10/2017   TRIG 89.0 05/10/2017   HDL 103.60 05/10/2017   LDLCALC 75 05/10/2017   ALT 20 12/06/2016   AST 26 12/06/2016   NA 136 05/10/2017   K 4.0 05/10/2017   CL 100 05/10/2017   CREATININE 0.74 05/10/2017   BUN 13 05/10/2017   CO2 27 05/10/2017   TSH 1.94 05/10/2017   HGBA1C 6.5 05/10/2017         Assessment & Plan:

## 2017-12-15 NOTE — Assessment & Plan Note (Signed)
stable overall by history and exam, recent data reviewed with pt, and pt to continue medical treatment as before,  to f/u any worsening symptoms or concerns Lab Results  Component Value Date   HGBA1C 6.5 05/10/2017

## 2017-12-15 NOTE — Patient Instructions (Signed)
You had the steroid shot today  Please take all new medication as prescribed - the antibiotic, cough medicine, and prednisone  Please continue all other medications as before, including the inhalers  Please have the pharmacy call with any other refills you may need.  Please continue your efforts at being more active, low cholesterol diet, and weight control.  Please keep your appointments with your specialists as you may have planned

## 2017-12-15 NOTE — Assessment & Plan Note (Addendum)
Mild to mod, suspect copd like exacerbation, for depomedrol IM 80, antibx course, cough med prn, predpac asd,  to f/u any worsening symptoms or concerns

## 2017-12-20 ENCOUNTER — Encounter: Payer: Self-pay | Admitting: Internal Medicine

## 2017-12-20 NOTE — Telephone Encounter (Signed)
We have Darnelle Bos [phone]. Not sure if it is still correct - but it looks like that is their office number

## 2017-12-21 NOTE — Telephone Encounter (Signed)
Called [facility] and spoke to [place]. Abigail Butts gave the number to the [place] at [phone].

## 2017-12-25 ENCOUNTER — Encounter: Payer: Self-pay | Admitting: Internal Medicine

## 2017-12-25 NOTE — Telephone Encounter (Signed)
I called and spoke to Joshua Cross, he stated that the have received a message that the equipment has been shipped. They will contact the patient as soon as they receive it.

## 2018-01-03 ENCOUNTER — Other Ambulatory Visit: Payer: Self-pay | Admitting: Internal Medicine

## 2018-01-04 NOTE — Telephone Encounter (Signed)
[place] for refill prednisone - done erx

## 2018-01-04 NOTE — Telephone Encounter (Signed)
Pls advise if ok to refill../lmb 

## 2018-01-08 NOTE — Telephone Encounter (Signed)
Colonoscopy done 01/18/17 with Dr.Perry.

## 2018-02-10 ENCOUNTER — Other Ambulatory Visit: Payer: Self-pay | Admitting: Internal Medicine

## 2018-02-10 DIAGNOSIS — J301 Allergic rhinitis due to pollen: Secondary | ICD-10-CM

## 2018-02-13 ENCOUNTER — Other Ambulatory Visit: Payer: Self-pay | Admitting: Internal Medicine

## 2018-02-26 ENCOUNTER — Other Ambulatory Visit: Payer: Self-pay | Admitting: Internal Medicine

## 2018-02-26 DIAGNOSIS — F3342 Major depressive disorder, recurrent, in full remission: Secondary | ICD-10-CM

## 2018-03-03 ENCOUNTER — Other Ambulatory Visit: Payer: Self-pay | Admitting: Internal Medicine

## 2018-03-03 DIAGNOSIS — J418 Mixed simple and mucopurulent chronic bronchitis: Secondary | ICD-10-CM

## 2018-03-22 ENCOUNTER — Other Ambulatory Visit: Payer: Self-pay | Admitting: Internal Medicine

## 2018-03-22 DIAGNOSIS — J988 Other specified respiratory disorders: Secondary | ICD-10-CM

## 2018-03-22 DIAGNOSIS — J441 Chronic obstructive pulmonary disease with (acute) exacerbation: Secondary | ICD-10-CM

## 2018-03-22 MED ORDER — CEFDINIR 300 MG PO CAPS: 300.0000 mg | ORAL_CAPSULE | Freq: Two times a day (BID) | ORAL | 0 refills | Status: AC

## 2018-03-30 ENCOUNTER — Other Ambulatory Visit: Payer: Self-pay | Admitting: Internal Medicine

## 2018-03-30 DIAGNOSIS — J418 Mixed simple and mucopurulent chronic bronchitis: Secondary | ICD-10-CM

## 2018-04-08 ENCOUNTER — Other Ambulatory Visit: Payer: Self-pay | Admitting: Internal Medicine

## 2018-04-08 DIAGNOSIS — J418 Mixed simple and mucopurulent chronic bronchitis: Secondary | ICD-10-CM

## 2018-04-12 ENCOUNTER — Other Ambulatory Visit: Payer: Self-pay | Admitting: Internal Medicine

## 2018-04-12 DIAGNOSIS — J441 Chronic obstructive pulmonary disease with (acute) exacerbation: Secondary | ICD-10-CM

## 2018-04-12 MED ORDER — PREDNISONE 20 MG PO TABS: 40.0000 mg | ORAL_TABLET | Freq: Every day | ORAL | 0 refills | Status: AC

## 2018-04-18 LAB — HM DIABETES EYE EXAM

## 2018-05-02 ENCOUNTER — Other Ambulatory Visit (INDEPENDENT_AMBULATORY_CARE_PROVIDER_SITE_OTHER): Payer: Medicare Other

## 2018-05-02 ENCOUNTER — Encounter: Payer: Self-pay | Admitting: Internal Medicine

## 2018-05-02 ENCOUNTER — Ambulatory Visit: Payer: Medicare Other | Admitting: Internal Medicine

## 2018-05-02 VITALS — BP 110/60 | HR 73 | Temp 97.9°F | Ht 67.0 in | Wt 124.0 lb

## 2018-05-02 DIAGNOSIS — N400 Enlarged prostate without lower urinary tract symptoms: Secondary | ICD-10-CM

## 2018-05-02 DIAGNOSIS — J418 Mixed simple and mucopurulent chronic bronchitis: Secondary | ICD-10-CM

## 2018-05-02 DIAGNOSIS — E785 Hyperlipidemia, unspecified: Secondary | ICD-10-CM

## 2018-05-02 DIAGNOSIS — E118 Type 2 diabetes mellitus with unspecified complications: Secondary | ICD-10-CM | POA: Insufficient documentation

## 2018-05-02 DIAGNOSIS — Z0000 Encounter for general adult medical examination without abnormal findings: Secondary | ICD-10-CM

## 2018-05-02 LAB — MICROALBUMIN / CREATININE URINE RATIO
Creatinine,U: 88.2 mg/dL
MICROALB UR: 1.4 mg/dL (ref 0.0–1.9)
Microalb Creat Ratio: 1.6 mg/g (ref 0.0–30.0)

## 2018-05-02 LAB — COMPREHENSIVE METABOLIC PANEL
ALT: 18 U/L (ref 0–53)
AST: 18 U/L (ref 0–37)
Albumin: 4.3 g/dL (ref 3.5–5.2)
Alkaline Phosphatase: 92 U/L (ref 39–117)
BUN: 13 mg/dL (ref 6–23)
CHLORIDE: 104 meq/L (ref 96–112)
CO2: 30 meq/L (ref 19–32)
Calcium: 9.4 mg/dL (ref 8.4–10.5)
Creatinine, Ser: 0.72 mg/dL (ref 0.40–1.50)
GFR: 106.96 mL/min (ref >60.00)
Glucose, Bld: 86 mg/dL (ref 70–99)
Potassium: 4.4 mEq/L (ref 3.5–5.1)
SODIUM: 140 meq/L (ref 135–145)
Total Bilirubin: 0.3 mg/dL (ref 0.2–1.2)
Total Protein: 6.7 g/dL (ref 6.0–8.3)

## 2018-05-02 LAB — URINALYSIS, ROUTINE W REFLEX MICROSCOPIC
Bilirubin Urine: NEGATIVE
HGB URINE DIPSTICK: NEGATIVE
Ketones, ur: NEGATIVE
Leukocytes, UA: NEGATIVE
Nitrite: NEGATIVE
Specific Gravity, Urine: 1.02 (ref 1.000–1.030)
Total Protein, Urine: NEGATIVE
Urine Glucose: NEGATIVE
Urobilinogen, UA: 0.2 (ref 0.0–1.0)
pH: 5.5 (ref 5.0–8.0)

## 2018-05-02 LAB — CBC WITH DIFFERENTIAL/PLATELET
Basophils Absolute: 0.1 10*3/uL (ref 0.0–0.1)
Basophils Relative: 1.1 % (ref 0.0–3.0)
Eosinophils Absolute: 0.6 10*3/uL (ref 0.0–0.7)
Eosinophils Relative: 7 % — ABNORMAL HIGH (ref 0.0–5.0)
HCT: 46.5 % (ref 39.0–52.0)
Hemoglobin: 15.8 g/dL (ref 13.0–17.0)
LYMPHS ABS: 2.3 10*3/uL (ref 0.7–4.0)
Lymphocytes Relative: 28.2 % (ref 12.0–46.0)
MCHC: 34 g/dL (ref 30.0–36.0)
MCV: 93.2 fl (ref 78.0–100.0)
Monocytes Absolute: 0.6 10*3/uL (ref 0.1–1.0)
Monocytes Relative: 7.2 % (ref 3.0–12.0)
NEUTROS ABS: 4.5 10*3/uL (ref 1.4–7.7)
NEUTROS PCT: 56.5 % (ref 43.0–77.0)
Platelets: 341 10*3/uL (ref 150.0–400.0)
RBC: 4.99 Mil/uL (ref 4.22–5.81)
RDW: 13 % (ref 11.5–15.5)
WBC: 8 10*3/uL (ref 4.0–10.5)

## 2018-05-02 LAB — LIPID PANEL
Cholesterol: 193 mg/dL (ref 0–200)
HDL: 97.2 mg/dL (ref >39.00)
LDL Cholesterol: 77 mg/dL (ref 0–99)
NONHDL: 95.76
Total CHOL/HDL Ratio: 2
Triglycerides: 92 mg/dL (ref 0.0–149.0)
VLDL: 18.4 mg/dL (ref 0.0–40.0)

## 2018-05-02 LAB — PSA: PSA: 0.91 ng/mL (ref 0.10–4.00)

## 2018-05-02 LAB — HEMOGLOBIN A1C: Hgb A1c MFr Bld: 6.3 % (ref 4.6–6.5)

## 2018-05-02 LAB — TSH: TSH: 2.92 u[IU]/mL (ref 0.35–4.50)

## 2018-05-02 NOTE — Progress Notes (Signed)
Subjective:  Patient ID: Joshua Cross, male    DOB: Jan 06, 1946  Age: 73 y.o. MRN: [id number]  CC: Annual Exam; Hyperlipidemia; and Diabetes   HPI Joshua Cross presents for a CPX.  His shortness of breath is at his baseline.  Over the last year he has taken a couple courses of systemic steroids and antibiotics when he has had a flareup of COPD.  He is now using supplemental oxygen.  He denies any recent episodes of cough, hemoptysis, chest pain, fever, chills, weight loss, or night sweats.  Outpatient Medications Prior to Visit  Medication Sig Dispense Refill  . albuterol (PROVENTIL HFA;VENTOLIN HFA) 108 (90 Base) MCG/ACT inhaler INHALE 2 PUFFS INTO THE LUNGS EVERY 6 HOURS AS NEEDED FOR WHEEZING OR SHORTNESS OF BREATH 18 g 3  . BROVANA 15 MCG/2ML NEBU INHALE CONTENTS OF 1 VIAL IN NEBULIZER TWICE A DAY 120 mL 5  . budesonide (PULMICORT) 0.5 MG/2ML nebulizer solution INHALE CONTENTS OF 1 VIAL VIA NEBULIZER TWICE DAILY 120 mL 11  . cholecalciferol (VITAMIN D) 1000 units tablet Take 2,000 Units by mouth daily.    . CHOLINE PO Take 2 tablets by mouth daily.    Marland Kitchen DALIRESP 500 MCG TABS tablet TAKE 1 TABLET BY MOUTH ONCE DAILY 90 tablet 1  . Glycopyrrolate (LONHALA MAGNAIR STARTER KIT) 25 MCG/ML SOLN Inhale 1 Act into the lungs 2 (two) times daily. 60 mL 1  . ipratropium (ATROVENT) 0.03 % nasal spray INSTILL 2 SPRAYS INTO EACH NOSTRIL EVERY 12 HOURS 30 mL 5  . LONHALA MAGNAIR REFILL KIT 25 MCG/ML SOLN INHALE 1 DOSE INTO THE LUNGS TWICE A DAY 60 mL 11  . LUMIGAN 0.01 % SOLN Instill 1 drop into both eyes once daily at bedtime  0  . magnesium gluconate (MAGONATE) 500 MG tablet Take 500 mg by mouth daily.    . Melatonin 10 MG CAPS Take 10 mg by mouth daily.     . Misc Natural Products (TART CHERRY ADVANCED PO) Take 1 tablet by mouth daily.     . Omega-3 Fatty Acids (FISH OIL) 1200 MG CAPS Take 1,200 mg by mouth 2 (two) times daily.     Marland Kitchen PARoxetine (PAXIL) 20 MG tablet TAKE 1 TABLET BY  MOUTH ONCE DAILY 90 tablet 1  . Potassium 99 MG TABS Take 1 tablet by mouth daily.     . Probiotic Product (PROBIOTIC ADVANCED PO) Take 1 capsule by mouth daily. From deep roots     . rosuvastatin (CRESTOR) 10 MG tablet TAKE 1 TABLET BY MOUTH ONCE DAILY 90 tablet 1  . TURMERIC PO Take 1 tablet by mouth daily.      No facility-administered medications prior to visit.     ROS Review of Systems  Constitutional: Negative for chills, diaphoresis, fatigue and fever.  HENT: Negative.  Negative for trouble swallowing.   Eyes: Negative for visual disturbance.  Respiratory: Positive for shortness of breath. Negative for cough and wheezing.   Cardiovascular: Negative for chest pain, palpitations and leg swelling.  Gastrointestinal: Negative for abdominal pain, constipation, diarrhea and nausea.  Endocrine: Negative.   Genitourinary: Negative.  Negative for difficulty urinating, scrotal swelling, testicular pain and urgency.  Musculoskeletal: Negative.  Negative for arthralgias and myalgias.  Skin: Negative for color change, pallor and rash.  Neurological: Negative.  Negative for dizziness, weakness, light-headedness and headaches.  Hematological: Negative for adenopathy. Does not bruise/bleed easily.  Psychiatric/Behavioral: Negative.     Objective:  BP 110/60 (BP Location: Left Arm,  Patient Position: Sitting, Cuff Size: Normal)   Pulse 73   Temp 97.9 F (36.6 C) (Oral)   Ht _0  (1.702 m)   Wt 124 lb (56.2 kg)   SpO2 96%   BMI 19.42 kg/m   BP Readings from Last 3 Encounters:  05/02/18 110/60  12/15/17 120/76  11/15/17 112/68    Wt Readings from Last 3 Encounters:  05/02/18 124 lb (56.2 kg)  12/15/17 125 lb (56.7 kg)  11/15/17 124 lb (56.2 kg)    Physical Exam Vitals signs reviewed.  Constitutional:      General: He is not in acute distress.    Appearance: He is ill-appearing. He is not toxic-appearing or diaphoretic.  HENT:     Nose: Nose normal. No congestion or  rhinorrhea.     Mouth/Throat:     Mouth: Mucous membranes are moist.     Pharynx: Oropharynx is clear. No oropharyngeal exudate or posterior oropharyngeal erythema.  Eyes:     General: No scleral icterus.    Pupils: Pupils are equal, round, and reactive to light.  Neck:     Musculoskeletal: Normal range of motion and neck supple.  Cardiovascular:     Rate and Rhythm: Normal rate and regular rhythm.     Heart sounds: No murmur. No gallop.   Pulmonary:     Effort: Tachypnea present.     Breath sounds: No stridor. Examination of the right-upper field reveals decreased breath sounds. Examination of the left-upper field reveals decreased breath sounds. Examination of the right-middle field reveals decreased breath sounds. Examination of the left-middle field reveals decreased breath sounds. Examination of the right-lower field reveals decreased breath sounds. Examination of the left-lower field reveals decreased breath sounds. Decreased breath sounds present. No wheezing, rhonchi or rales.     Comments: On O2 Abdominal:     Hernia: There is no hernia in the right inguinal area or left inguinal area.  Genitourinary:    Penis: Normal and circumcised. No discharge, swelling or lesions.      Scrotum/Testes: Normal.        Right: Mass, tenderness or swelling not present.        Left: Mass, tenderness or swelling not present.     Epididymis:     Right: Normal. Not inflamed or enlarged. No mass.     Left: Not inflamed or enlarged. No mass.     Prostate: Normal. Not enlarged, not tender and no nodules present.     Rectum: Normal. Guaiac result negative. No mass, tenderness, anal fissure, external hemorrhoid or internal hemorrhoid. Normal anal tone.  Musculoskeletal: Normal range of motion.        General: No swelling.     Right lower leg: No edema.  Lymphadenopathy:     Lower Body: No right inguinal adenopathy. No left inguinal adenopathy.  Skin:    General: Skin is warm.     Findings: No  lesion or rash.  Neurological:     General: No focal deficit present.     Mental Status: He is alert and oriented to person, place, and time. Mental status is at baseline.  Psychiatric:        Mood and Affect: Mood normal.        Behavior: Behavior normal.        Thought Content: Thought content normal.        Judgment: Judgment normal.     Lab Results  Component Value Date   WBC 8.0 05/02/2018   HGB 15.8  05/02/2018   HCT 46.5 05/02/2018   PLT 341.0 05/02/2018   GLUCOSE 86 05/02/2018   CHOL 193 05/02/2018   TRIG 92.0 05/02/2018   HDL 97.20 05/02/2018   LDLCALC 77 05/02/2018   ALT 18 05/02/2018   AST 18 05/02/2018   NA 140 05/02/2018   K 4.4 05/02/2018   CL 104 05/02/2018   CREATININE 0.72 05/02/2018   BUN 13 05/02/2018   CO2 30 05/02/2018   TSH 2.92 05/02/2018   PSA 0.91 05/02/2018   HGBA1C 6.3 05/02/2018   MICROALBUR 1.4 05/02/2018    Dg Chest 2 View  Result Date: 04/30/2017 CLINICAL DATA:  Shortness of breath history of COPD EXAM: CHEST  2 VIEW COMPARISON:  01/02/2017, CT chest 12/06/2016 FINDINGS: Hyperinflation with emphysematous disease. No acute consolidation or effusion. Stable cardiomediastinal silhouette. Postsurgical changes in the right upper lung. Degenerative changes of the spine. No pneumothorax. IMPRESSION: No active cardiopulmonary disease. Hyperinflation with emphysematous disease. Electronically Signed   By: Donavan Foil M.D.   On: 04/30/2017 18:00    Assessment & Plan:   Johnel was seen today for annual exam, hyperlipidemia and diabetes.  Diagnoses and all orders for this visit:  Type II diabetes mellitus with manifestations (HCC)-his A1c is at 6.3%.  His blood sugars are adequately well controlled. -     CBC with Differential/Platelet; Future -     Comprehensive metabolic panel; Future -     Microalbumin / creatinine urine ratio; Future -     Hemoglobin A1c; Future -     HM Diabetes Foot Exam  Hyperlipidemia with target LDL less than 130-he  has achieved his LDL goal and is doing well on the statin. -     Lipid panel; Future -     TSH; Future  Benign prostatic hyperplasia without lower urinary tract symptoms-PSA is low which is reassuring that he does not have prostate cancer.  He has no symptoms that need to be treated. -     PSA; Future -     Urinalysis, Routine w reflex microscopic; Future  Routine general medical examination at a health care facility-   Mixed simple and mucopurulent chronic bronchitis ([place])- He is stable on the inhaler regimen that includes a LABA/LAMA/and ICS. Will cont O2.   I am having [name] "Jim" maintain his LUMIGAN, Probiotic Product (PROBIOTIC ADVANCED PO), magnesium gluconate, Potassium, TURMERIC PO, Misc Natural Products (TART CHERRY ADVANCED PO), Fish Oil, Melatonin, Glycopyrrolate, cholecalciferol, CHOLINE PO, rosuvastatin, ipratropium, albuterol, PARoxetine, BROVANA, LONHALA MAGNAIR REFILL KIT, budesonide, and DALIRESP.  No orders of the defined types were placed in this encounter.    Follow-up: Return in about 6 months (around 10/31/2018).  Scarlette Calico, MD

## 2018-05-02 NOTE — Progress Notes (Signed)
Patient consent obtained. Irrigation with water and peroxide performed on bilateral ears. Full view of tympanic membranes after procedure.  Patient tolerated procedure well.   

## 2018-05-02 NOTE — Patient Instructions (Signed)

## 2018-05-03 ENCOUNTER — Encounter: Payer: Self-pay | Admitting: Internal Medicine

## 2018-05-03 DIAGNOSIS — Z0000 Encounter for general adult medical examination without abnormal findings: Secondary | ICD-10-CM | POA: Insufficient documentation

## 2018-05-03 DIAGNOSIS — N400 Enlarged prostate without lower urinary tract symptoms: Secondary | ICD-10-CM | POA: Insufficient documentation

## 2018-05-04 ENCOUNTER — Other Ambulatory Visit: Payer: Self-pay | Admitting: Internal Medicine

## 2018-05-04 DIAGNOSIS — E7800 Pure hypercholesterolemia, unspecified: Secondary | ICD-10-CM

## 2018-05-15 ENCOUNTER — Ambulatory Visit (INDEPENDENT_AMBULATORY_CARE_PROVIDER_SITE_OTHER)
Admission: RE | Admit: 2018-05-15 | Discharge: 2018-05-15 | Disposition: A | Discharge status: Home / Self Care | Payer: Medicare Other | Source: Ambulatory Visit | Attending: Internal Medicine | Admitting: Internal Medicine

## 2018-05-15 ENCOUNTER — Ambulatory Visit: Payer: Medicare Other | Admitting: Internal Medicine

## 2018-05-15 ENCOUNTER — Encounter: Payer: Self-pay | Admitting: Internal Medicine

## 2018-05-15 VITALS — BP 130/70 | HR 76 | Temp 98.0°F | Resp 20 | Ht 67.0 in | Wt 127.0 lb

## 2018-05-15 DIAGNOSIS — R05 Cough: Secondary | ICD-10-CM

## 2018-05-15 DIAGNOSIS — J441 Chronic obstructive pulmonary disease with (acute) exacerbation: Secondary | ICD-10-CM | POA: Diagnosis not present

## 2018-05-15 DIAGNOSIS — R059 Cough, unspecified: Secondary | ICD-10-CM

## 2018-05-15 MED ORDER — METHYLPREDNISOLONE ACETATE 80 MG/ML IJ SUSP
120.0000 mg | Freq: Once | INTRAMUSCULAR | Status: AC
  Administered 2018-05-15: 120 mg via INTRAMUSCULAR

## 2018-05-15 NOTE — Patient Instructions (Signed)
Cough, Adult  Coughing is a reflex that clears your throat and your airways. Coughing helps to heal and protect your lungs. It is normal to cough occasionally, but a cough that happens with other symptoms or lasts a long time may be a sign of a condition that needs treatment. A cough may last only 2-3 weeks (acute), or it may last longer than 8 weeks (chronic). What are the causes? Coughing is commonly caused by:  Breathing in substances that irritate your lungs.  A viral or bacterial respiratory infection.  Allergies.  Asthma.  Postnasal drip.  Smoking.  Acid backing up from the stomach into the esophagus (gastroesophageal reflux).  Certain medicines.  Chronic lung problems, including COPD (or rarely, lung cancer).  Other medical conditions such as heart failure. Follow these instructions at home: Pay attention to any changes in your symptoms. Take these actions to help with your discomfort:  Take medicines only as told by your health care provider. ? If you were prescribed an antibiotic medicine, take it as told by your health care provider. Do not stop taking the antibiotic even if you start to feel better. ? Talk with your health care provider before you take a cough suppressant medicine.  Drink enough fluid to keep your urine clear or pale yellow.  If the air is dry, use a cold steam vaporizer or humidifier in your bedroom or your home to help loosen secretions.  Avoid anything that causes you to cough at work or at home.  If your cough is worse at night, try sleeping in a semi-upright position.  Avoid cigarette smoke. If you smoke, quit smoking. If you need help quitting, ask your health care provider.  Avoid caffeine.  Avoid alcohol.  Rest as needed. Contact a health care provider if:  You have new symptoms.  You cough up pus.  Your cough does not get better after 2-3 weeks, or your cough gets worse.  You cannot control your cough with suppressant  medicines and you are losing sleep.  You develop pain that is getting worse or pain that is not controlled with pain medicines.  You have a fever.  You have unexplained weight loss.  You have night sweats. Get help right away if:  You cough up blood.  You have difficulty breathing.  Your heartbeat is very fast. This information is not intended to replace advice given to you by your health care provider. Make sure you discuss any questions you have with your health care provider. Document Released: 09/24/2010 Document Revised: 09/03/2015 Document Reviewed: 06/04/2014 Elsevier Interactive Patient Education  2019 [employer] Inc[employer]  

## 2018-05-15 NOTE — Progress Notes (Signed)
Subjective:  Patient ID: Joshua Cross, male    DOB: May 30, 1945  Age: 73 y.o. MRN: [id number]  CC: Cough   HPI Joshua Cross presents for f/up - He complains of a 2-week history of worsening cough that is productive of clear phlegm with wheezing, shortness of breath, labored breathing, and increased use of oxygen.  Outpatient Medications Prior to Visit  Medication Sig Dispense Refill  . albuterol (PROVENTIL HFA;VENTOLIN HFA) 108 (90 Base) MCG/ACT inhaler INHALE 2 PUFFS INTO THE LUNGS EVERY 6 HOURS AS NEEDED FOR WHEEZING OR SHORTNESS OF BREATH 18 g 3  . BROVANA 15 MCG/2ML NEBU INHALE CONTENTS OF 1 VIAL IN NEBULIZER TWICE A DAY 120 mL 5  . budesonide (PULMICORT) 0.5 MG/2ML nebulizer solution INHALE CONTENTS OF 1 VIAL VIA NEBULIZER TWICE DAILY 120 mL 11  . cholecalciferol (VITAMIN D) 1000 units tablet Take 2,000 Units by mouth daily.    . CHOLINE PO Take 2 tablets by mouth daily.    Marland Kitchen DALIRESP 500 MCG TABS tablet TAKE 1 TABLET BY MOUTH ONCE DAILY 90 tablet 1  . Glycopyrrolate (LONHALA MAGNAIR STARTER KIT) 25 MCG/ML SOLN Inhale 1 Act into the lungs 2 (two) times daily. 60 mL 1  . ipratropium (ATROVENT) 0.03 % nasal spray INSTILL 2 SPRAYS INTO EACH NOSTRIL EVERY 12 HOURS 30 mL 5  . LONHALA MAGNAIR REFILL KIT 25 MCG/ML SOLN INHALE 1 DOSE INTO THE LUNGS TWICE A DAY 60 mL 11  . LUMIGAN 0.01 % SOLN Instill 1 drop into both eyes once daily at bedtime  0  . magnesium gluconate (MAGONATE) 500 MG tablet Take 500 mg by mouth daily.    . Melatonin 10 MG CAPS Take 10 mg by mouth daily.     . Misc Natural Products (TART CHERRY ADVANCED PO) Take 1 tablet by mouth daily.     . Omega-3 Fatty Acids (FISH OIL) 1200 MG CAPS Take 1,200 mg by mouth 2 (two) times daily.     Marland Kitchen PARoxetine (PAXIL) 20 MG tablet TAKE 1 TABLET BY MOUTH ONCE DAILY 90 tablet 1  . Potassium 99 MG TABS Take 1 tablet by mouth daily.     . Probiotic Product (PROBIOTIC ADVANCED PO) Take 1 capsule by mouth daily. From deep roots       . rosuvastatin (CRESTOR) 10 MG tablet Take 1 tablet (10 mg total) by mouth daily. 90 tablet 1  . TURMERIC PO Take 1 tablet by mouth daily.      No facility-administered medications prior to visit.     ROS Review of Systems  Constitutional: Negative.  Negative for chills, diaphoresis, fatigue and fever.  HENT: Negative.  Negative for facial swelling, sinus pressure, sore throat and trouble swallowing.   Eyes: Negative.   Respiratory: Positive for cough, shortness of breath and wheezing. Negative for choking, chest tightness and stridor.   Cardiovascular: Negative for chest pain, palpitations and leg swelling.  Gastrointestinal: Negative for abdominal pain, constipation, diarrhea, nausea and vomiting.  Endocrine: Negative.   Genitourinary: Negative.  Negative for difficulty urinating and dysuria.  Musculoskeletal: Negative.  Negative for arthralgias and myalgias.  Skin: Negative for color change, pallor and rash.  Neurological: Negative.  Negative for dizziness, weakness, light-headedness and headaches.  Hematological: Negative for adenopathy. Does not bruise/bleed easily.  Psychiatric/Behavioral: Negative.     Objective:  BP 130/70   Pulse 76   Temp 98 F (36.7 C) (Oral)   Resp 20   Ht '5\' 7"'$  (1.702 m)   Wt 127  lb (57.6 kg)   SpO2 94%   BMI 19.89 kg/m   BP Readings from Last 3 Encounters:  05/15/18 130/70  05/02/18 110/60  12/15/17 120/76    Wt Readings from Last 3 Encounters:  05/15/18 127 lb (57.6 kg)  05/02/18 124 lb (56.2 kg)  12/15/17 125 lb (56.7 kg)    Physical Exam Vitals signs reviewed.  Constitutional:      General: He is in acute distress.     Appearance: He is ill-appearing. He is not toxic-appearing or diaphoretic.  HENT:     Nose: Nose normal. No congestion or rhinorrhea.     Mouth/Throat:     Mouth: Mucous membranes are moist.     Pharynx: Oropharynx is clear. No oropharyngeal exudate or posterior oropharyngeal erythema.  Eyes:     General: No  scleral icterus.    Conjunctiva/sclera: Conjunctivae normal.  Neck:     Musculoskeletal: Normal range of motion and neck supple.  Cardiovascular:     Rate and Rhythm: Normal rate and regular rhythm.  Pulmonary:     Effort: Pulmonary effort is normal. Tachypnea present.     Breath sounds: No stridor or decreased air movement. Examination of the right-middle field reveals rhonchi. Examination of the left-middle field reveals rhonchi. Examination of the right-lower field reveals wheezing. Examination of the left-lower field reveals wheezing. Wheezing and rhonchi present. No rales.     Comments: There are expiratory wheezes in both bases and expiratory rhonchi in the mid zones Abdominal:     General: Abdomen is flat.     Palpations: Abdomen is soft.     Tenderness: There is no abdominal tenderness.  Musculoskeletal: Normal range of motion.        General: No swelling.     Right lower leg: No edema.     Left lower leg: No edema.  Lymphadenopathy:     Cervical: No cervical adenopathy.  Skin:    General: Skin is warm and dry.     Coloration: Skin is not pale.     Findings: No erythema or rash.  Neurological:     General: No focal deficit present.     Mental Status: He is oriented to person, place, and time. Mental status is at baseline.     Lab Results  Component Value Date   WBC 8.0 05/02/2018   HGB 15.8 05/02/2018   HCT 46.5 05/02/2018   PLT 341.0 05/02/2018   GLUCOSE 86 05/02/2018   CHOL 193 05/02/2018   TRIG 92.0 05/02/2018   HDL 97.20 05/02/2018   LDLCALC 77 05/02/2018   ALT 18 05/02/2018   AST 18 05/02/2018   NA 140 05/02/2018   K 4.4 05/02/2018   CL 104 05/02/2018   CREATININE 0.72 05/02/2018   BUN 13 05/02/2018   CO2 30 05/02/2018   TSH 2.92 05/02/2018   PSA 0.91 05/02/2018   HGBA1C 6.3 05/02/2018   MICROALBUR 1.4 05/02/2018    Dg Chest 2 View  Result Date: 04/30/2017 CLINICAL DATA:  Shortness of breath history of COPD EXAM: CHEST  2 VIEW COMPARISON:   01/02/2017, CT chest 12/06/2016 FINDINGS: Hyperinflation with emphysematous disease. No acute consolidation or effusion. Stable cardiomediastinal silhouette. Postsurgical changes in the right upper lung. Degenerative changes of the spine. No pneumothorax. IMPRESSION: No active cardiopulmonary disease. Hyperinflation with emphysematous disease. Electronically Signed   By: Donavan Foil M.D.   On: 04/30/2017 18:00    Dg Chest 2 View  Result Date: 05/15/2018 CLINICAL DATA:  Chronic dyspnea; worsening  x 2 mo. Hx of COPD, emphysema, right lung sx. Ex smoker. EXAM: CHEST - 2 VIEW COMPARISON:  04/30/2017 FINDINGS: Lungs are hyperinflated. Marked emphysematous changes. There is perihilar peribronchial thickening, stable and chronic. There are no new consolidations or pleural effusions. No pulmonary edema. Surgical clips are identified in the MEDIAL aspect of the RIGHT lung apex. Remote rib fractures. IMPRESSION: Chronic, marked hyperinflation/emphysematous changes of the lungs. The superimposed infectious process. Electronically Signed   By: Nolon Nations M.D.   On: 05/15/2018 13:54    Assessment & Plan:   Jodeci was seen today for cough.  Diagnoses and all orders for this visit:  Cough- His chest x-ray is negative for mass or infiltrate. -     DG Chest 2 View; Future  COPD exacerbation ([place])- He has had multiple exacerbations over the last year despite maximal therapy.  I do not think there is an infectious component to this exacerbation so I did not prescribe an antibiotic.  I think he would benefit from systemic steroids so I  Gave him a dose of Depo-Medrol in the office today.  I have also asked him to see pulmonary to see if there are any additional treatment options that he would benefit from. -     methylPREDNISolone acetate (DEPO-MEDROL) injection 120 mg -     Ambulatory referral to Pulmonology   I am having [name] "Jim" maintain his LUMIGAN, Probiotic Product (PROBIOTIC ADVANCED  PO), magnesium gluconate, Potassium, TURMERIC PO, Misc Natural Products (TART CHERRY ADVANCED PO), Fish Oil, Melatonin, Glycopyrrolate, cholecalciferol, CHOLINE PO, ipratropium, albuterol, PARoxetine, BROVANA, LONHALA MAGNAIR REFILL KIT, budesonide, DALIRESP, and rosuvastatin. We administered methylPREDNISolone acetate.  Meds ordered this encounter  Medications  . methylPREDNISolone acetate (DEPO-MEDROL) injection 120 mg     Follow-up: Return if symptoms worsen or fail to improve.  Scarlette Calico, MD

## 2018-05-16 ENCOUNTER — Encounter: Payer: Self-pay | Admitting: Internal Medicine

## 2018-05-31 ENCOUNTER — Ambulatory Visit: Payer: Medicare Other | Admitting: Pulmonary Disease

## 2018-05-31 ENCOUNTER — Encounter: Payer: Self-pay | Admitting: Pulmonary Disease

## 2018-05-31 VITALS — BP 160/74 | HR 70 | Ht 67.0 in | Wt 124.8 lb

## 2018-05-31 DIAGNOSIS — J439 Emphysema, unspecified: Secondary | ICD-10-CM | POA: Diagnosis not present

## 2018-05-31 DIAGNOSIS — Z87891 Personal history of nicotine dependence: Secondary | ICD-10-CM

## 2018-05-31 LAB — CBC WITH DIFFERENTIAL/PLATELET
Basophils Absolute: 0 10*3/uL (ref 0.0–0.1)
Basophils Relative: 0.6 % (ref 0.0–3.0)
Eosinophils Absolute: 0.4 10*3/uL (ref 0.0–0.7)
Eosinophils Relative: 5.3 % — ABNORMAL HIGH (ref 0.0–5.0)
HCT: 44.3 % (ref 39.0–52.0)
Hemoglobin: 15 g/dL (ref 13.0–17.0)
LYMPHS ABS: 2.9 10*3/uL (ref 0.7–4.0)
Lymphocytes Relative: 34.1 % (ref 12.0–46.0)
MCHC: 33.9 g/dL (ref 30.0–36.0)
MCV: 92.8 fl (ref 78.0–100.0)
Monocytes Absolute: 0.6 10*3/uL (ref 0.1–1.0)
Monocytes Relative: 7.5 % (ref 3.0–12.0)
Neutro Abs: 4.4 10*3/uL (ref 1.4–7.7)
Neutrophils Relative %: 52.5 % (ref 43.0–77.0)
Platelets: 331 10*3/uL (ref 150.0–400.0)
RBC: 4.77 Mil/uL (ref 4.22–5.81)
RDW: 12.8 % (ref 11.5–15.5)
WBC: 8.4 10*3/uL (ref 4.0–10.5)

## 2018-05-31 NOTE — Patient Instructions (Signed)
We will schedule you for pulmonary function test Check baseline labs including CBC differential, IgE, alpha-1 antitrypsin levels and phenotype We will schedule you for low-dose screening CTs of the chest You are on a very good inhaler regimen continue the nebulizers as prescribed Continue Daliresp  Follow-up in 3 months.

## 2018-05-31 NOTE — Progress Notes (Signed)
Joshua Cross    [id number]    10-05-1945  Primary Care Physician:Jones, Arvid Right, MD  Referring Physician: Janith Lima, MD [address] [place], [place] [address]  Chief complaint: Consult for COPD  HPI: 73 year old with history of severe emphysema, sinus allergies, hyperlipidemia Diagnosed in 2007.  Initially maintained on Trelegy inhaler.  This was changed to Brovana, Pulmicort and Longhala nebulizer last year.  He is also on Daliresp maintenance therapy. Complaint is dyspnea on exertion, minor symptoms at rest.  Has chronic cough with white mucus production.  He is on supplemental oxygen at nighttime and carries a portable concentrator during the daytime.  We noted for hospitalization in 2018 for severe bilateral lower lobe pneumonia.  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  Quit in 2006 Travel history: Lived in [place], [place], [place].  No significant recent travel Relevant family history: Mother had emphysema, she was a smoker.  Outpatient Encounter Medications as of 05/31/2018  Medication Sig  . albuterol (PROVENTIL HFA;VENTOLIN HFA) 108 (90 Base) MCG/ACT inhaler INHALE 2 PUFFS INTO THE LUNGS EVERY 6 HOURS AS NEEDED FOR WHEEZING OR SHORTNESS OF BREATH  . BROVANA 15 MCG/2ML NEBU INHALE CONTENTS OF 1 VIAL IN NEBULIZER TWICE A DAY  . budesonide (PULMICORT) 0.5 MG/2ML nebulizer solution INHALE CONTENTS OF 1 VIAL VIA NEBULIZER TWICE DAILY  . cholecalciferol (VITAMIN D) 1000 units tablet Take 2,000 Units by mouth daily.  . CHOLINE PO Take 2 tablets by mouth daily.  Marland Kitchen DALIRESP 500 MCG TABS tablet TAKE 1 TABLET BY MOUTH ONCE DAILY  . Glycopyrrolate (LONHALA MAGNAIR STARTER KIT) 25 MCG/ML SOLN Inhale 1 Act  into the lungs 2 (two) times daily.  Marland Kitchen ipratropium (ATROVENT) 0.03 % nasal spray INSTILL 2 SPRAYS INTO EACH NOSTRIL EVERY 12 HOURS  . LONHALA MAGNAIR REFILL KIT 25 MCG/ML SOLN INHALE 1 DOSE INTO THE LUNGS TWICE A DAY  . LUMIGAN 0.01 % SOLN Instill 1 drop into both eyes once daily at bedtime  . magnesium gluconate (MAGONATE) 500 MG tablet Take 500 mg by mouth daily.  . Melatonin 10 MG CAPS Take 10 mg by mouth daily.   . Misc Natural Products (TART CHERRY ADVANCED PO) Take 1 tablet by mouth daily.   . Omega-3 Fatty Acids (FISH OIL) 1200 MG CAPS Take 1,200 mg by mouth 2 (two) times daily.   Marland Kitchen PARoxetine (PAXIL) 20 MG tablet TAKE 1 TABLET BY MOUTH ONCE DAILY  . Potassium 99 MG TABS Take 1 tablet by mouth daily.   . Probiotic Product (PROBIOTIC ADVANCED PO) Take 1 capsule by mouth daily. From deep roots   . rosuvastatin (CRESTOR) 10 MG tablet Take 1 tablet (10 mg total) by mouth daily.  . TURMERIC PO Take 1 tablet by mouth daily.    No facility-administered encounter medications on file as of 05/31/2018.     Allergies as of 05/31/2018 - Review Complete 05/31/2018  Allergen Reaction Noted  . Erythromycin  02/25/2010  . Penicillins  02/25/2010    Past Medical History:  Diagnosis Date  . COPD (chronic obstructive pulmonary disease) ([place])   . Hyperlipidemia     Past Surgical History:  Procedure Laterality Date  . HERNIA REPAIR    .  LUNG REMOVAL, PARTIAL Right 1985  . TONSILLECTOMY      Family History  Problem Relation Age of Onset  . Alcohol abuse Mother   . Stroke Father   . Depression Sister   . Heart disease Sister   . Cancer Neg Hx   . COPD Neg Hx   . Early death Neg Hx   . Kidney disease Neg Hx   . Hypertension Neg Hx   . Learning disabilities Neg Hx     Social History   Socioeconomic History  . Marital status: Divorced    Spouse name: Not on file  . Number of children: Not on file  . Years of education: Not on file  . Highest education level: Not on file    Occupational History  . Occupation: retired  Scientific laboratory technician  . Financial resource strain: Not hard at all  . Food insecurity:    Worry: Never true    Inability: Never true  . Transportation needs:    Medical: No    Non-medical: No  Tobacco Use  . Smoking status: Former Smoker    Packs/day: 0.25    Years: 50.00    Pack years: 12.50    Types: Cigarettes    Last attempt to quit: 08/06/2004    Years since quitting: 13.8  . Smokeless tobacco: Never Used  Substance and Sexual Activity  . Alcohol use: No  . Drug use: No  . Sexual activity: Not Currently  Lifestyle  . Physical activity:    Days per week: 3 days    Minutes per session: 40 min  . Stress: Only a little  Relationships  . Social connections:    Talks on phone: More than three times a week    Gets together: More than three times a week    Attends religious service: Not on file    Active member of club or organization: Not on file    Attends meetings of clubs or organizations: Not on file    Relationship status: Not on file  . Intimate partner violence:    Fear of current or ex partner: Not on file    Emotionally abused: Not on file    Physically abused: Not on file    Forced sexual activity: Not on file  Other Topics Concern  . Not on file  Social History Narrative   Patient is from [place], moved to [place] to attend school at [employer], then started work [employer] studies at college in [place]. 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 He is an avid reader and active in [place].    Review of systems: Review of Systems  Constitutional: Negative for fever and chills.  HENT: Negative.   Eyes: Negative for blurred vision.  Respiratory: as per HPI  Cardiovascular: Negative for chest pain and palpitations.  Gastrointestinal: Negative for vomiting, diarrhea, blood per rectum. Genitourinary: Negative for dysuria, urgency, frequency and hematuria.  Musculoskeletal: Negative for myalgias,  back pain and joint pain.  Skin: Negative for itching and rash.  Neurological: Negative for dizziness, tremors, focal weakness, seizures and loss of consciousness.  Endo/Heme/Allergies: Negative for environmental allergies.  Psychiatric/Behavioral: Negative for depression, suicidal ideas and hallucinations.  All other systems reviewed and are negative.  Physical Exam: Blood pressure (!) 160/74, pulse 70, height 5' 7" (1.702 m), weight 124 lb 12.8 oz (56.6 kg), SpO2 92 %. Gen:      No acute distress HEENT:  EOMI, sclera anicteric Neck:  No masses; no thyromegaly Lungs:    Clear to auscultation bilaterally; normal respiratory effort CV:         Regular rate and rhythm; no murmurs Abd:      + bowel sounds; soft, non-tender; no palpable masses, no distension Ext:    No edema; adequate peripheral perfusion Skin:      Warm and dry; no rash Neuro: alert and oriented x 3 Psych: normal mood and affect  Data Reviewed: Imaging: CTA 12/06/2016- no pulmonary embolism, severe emphysematous changes with bullae, patchy consolidation in the bilateral lung bases. X-ray 04/30/2017-hyperinflation with emphysema.  Clearance of lower lobe opacities Chest x-ray 05/15/2018- chronic hyperinflation, emphysema. I have reviewed the images personally.  PFTs: Pending  Labs: CBC 05/02/2018-WBC 8, eos 7%, absolute eosinophil count 560  Assessment:  Severe emphysema, no PFTs on record He is on a good regimen with daliresp, inhaled steroids, LABA, LAMA Continue supplemental oxygen. Get baseline evaluation with PFTs, CBC differential, IgE and alpha-1 antitrypsin levels and phenotype  Refer for low-dose screening CT of the chest  Plan/Recommendations: - Continue current nebulizer regimen, daliresp - Supplemental oxygen - Check PFTs, CBC, IgE, alpha-1 antitrypsin - Screening CT of the chest  Marshell Garfinkel MD [facility] 05/31/2018, 9:05 AM  CC: Janith Lima, MD

## 2018-06-04 ENCOUNTER — Other Ambulatory Visit: Payer: Self-pay | Admitting: Acute Care

## 2018-06-04 DIAGNOSIS — Z122 Encounter for screening for malignant neoplasm of respiratory organs: Secondary | ICD-10-CM

## 2018-06-04 DIAGNOSIS — Z87891 Personal history of nicotine dependence: Secondary | ICD-10-CM

## 2018-06-06 LAB — ALPHA-1 ANTITRYPSIN PHENOTYPE: A-1 Antitrypsin, Ser: 126 mg/dL (ref 83–199)

## 2018-06-06 LAB — IGE: IgE (Immunoglobulin E), Serum: 334 kU/L — ABNORMAL HIGH (ref <=114)

## 2018-06-25 ENCOUNTER — Telehealth: Payer: Self-pay

## 2018-06-25 NOTE — Telephone Encounter (Signed)
Patient called stating Margie left him a message regarding his results and his appt. Appt confirmed.   Call back # [phone]

## 2018-06-25 NOTE — Telephone Encounter (Signed)
LMTCB

## 2018-06-27 NOTE — Telephone Encounter (Signed)
Pt states that this has been going on for 2 weeks and his appt is Monday - he states that if it is important we need to reach each other.

## 2018-06-27 NOTE — Telephone Encounter (Signed)
Notes recorded by Marshell Garfinkel, MD on 06/18/2018 at 1:30 PM EDT Labs show elevation in IgE and eosinophils which could be from allergy. Continue current regimen.  Called and left detailed msg on machine ok per [facility]

## 2018-07-02 ENCOUNTER — Encounter: Payer: Medicare Other | Admitting: Acute Care

## 2018-07-02 ENCOUNTER — Ambulatory Visit: Payer: Medicare Other

## 2018-07-11 ENCOUNTER — Other Ambulatory Visit: Payer: Self-pay | Admitting: Internal Medicine

## 2018-07-16 ENCOUNTER — Telehealth: Payer: Self-pay

## 2018-07-16 NOTE — Telephone Encounter (Signed)
PA for Daliresp initiated via telephone.

## 2018-07-18 NOTE — Telephone Encounter (Signed)
PA for Daliresp has been approved. Fax sent to pharmacy informing of same.

## 2018-07-19 ENCOUNTER — Encounter: Payer: Self-pay | Admitting: Pulmonary Disease

## 2018-08-06 ENCOUNTER — Telehealth: Payer: Self-pay | Admitting: Pulmonary Disease

## 2018-08-06 NOTE — Telephone Encounter (Signed)
FYI for Dr. Vaughan Browner,  Joshua Cross sent me a letter explaining his frustration with getting through on the phone regarding his 08/29/2018 appointment. We had to cancel his PFT and f/u appointment with you due to the COVID-19 pandemic, and the physicians be redirected to our ICU's. Office protocol dictates that we call the patient 3 times prior to canceling an appointment, and then send a letter if we don't hear back from the patient. Apparently, he was trying to call us back, but unable to get through in a timely manner. I tried to apologize and advise that due to minimal patients coming to the office we have flexed staff. He cut me off and stated that he doesn't need an apology and that he doesn't think that he needs to come back to [facility]. I have put a note in his canceled appointment to not contact the patient to reschedule. Thank you - PR

## 2018-08-28 ENCOUNTER — Other Ambulatory Visit: Payer: Self-pay | Admitting: Internal Medicine

## 2018-08-28 DIAGNOSIS — F3342 Major depressive disorder, recurrent, in full remission: Secondary | ICD-10-CM

## 2018-08-29 ENCOUNTER — Ambulatory Visit: Payer: Medicare Other | Admitting: Pulmonary Disease

## 2018-10-09 ENCOUNTER — Other Ambulatory Visit: Payer: Self-pay | Admitting: Internal Medicine

## 2018-10-09 DIAGNOSIS — J418 Mixed simple and mucopurulent chronic bronchitis: Secondary | ICD-10-CM

## 2018-10-09 MED ORDER — DALIRESP 500 MCG PO TABS: 500.0000 ug | ORAL_TABLET | Freq: Every day | ORAL | 1 refills | Status: DC

## 2018-10-21 ENCOUNTER — Encounter: Payer: Self-pay | Admitting: Internal Medicine

## 2018-10-31 ENCOUNTER — Other Ambulatory Visit: Payer: Self-pay

## 2018-10-31 ENCOUNTER — Encounter: Payer: Self-pay | Admitting: Internal Medicine

## 2018-10-31 ENCOUNTER — Other Ambulatory Visit (INDEPENDENT_AMBULATORY_CARE_PROVIDER_SITE_OTHER): Payer: Medicare Other

## 2018-10-31 ENCOUNTER — Ambulatory Visit (INDEPENDENT_AMBULATORY_CARE_PROVIDER_SITE_OTHER): Payer: Medicare Other | Admitting: Internal Medicine

## 2018-10-31 VITALS — BP 124/74 | HR 73 | Temp 98.0°F | Resp 18 | Ht 67.0 in | Wt 121.0 lb

## 2018-10-31 DIAGNOSIS — R197 Diarrhea, unspecified: Secondary | ICD-10-CM

## 2018-10-31 DIAGNOSIS — E118 Type 2 diabetes mellitus with unspecified complications: Secondary | ICD-10-CM

## 2018-10-31 LAB — CBC WITH DIFFERENTIAL/PLATELET
Basophils Absolute: 0 10*3/uL (ref 0.0–0.1)
Basophils Relative: 0.4 % (ref 0.0–3.0)
Eosinophils Absolute: 0.4 10*3/uL (ref 0.0–0.7)
Eosinophils Relative: 4.4 % (ref 0.0–5.0)
HCT: 42.6 % (ref 39.0–52.0)
Hemoglobin: 14.4 g/dL (ref 13.0–17.0)
Lymphocytes Relative: 42.4 % (ref 12.0–46.0)
Lymphs Abs: 3.8 10*3/uL (ref 0.7–4.0)
MCHC: 33.7 g/dL (ref 30.0–36.0)
MCV: 93.1 fl (ref 78.0–100.0)
Monocytes Absolute: 0.6 10*3/uL (ref 0.1–1.0)
Monocytes Relative: 7.3 % (ref 3.0–12.0)
Neutro Abs: 4.1 10*3/uL (ref 1.4–7.7)
Neutrophils Relative %: 45.5 % (ref 43.0–77.0)
Platelets: 313 10*3/uL (ref 150.0–400.0)
RBC: 4.57 Mil/uL (ref 4.22–5.81)
RDW: 13.1 % (ref 11.5–15.5)
WBC: 8.9 10*3/uL (ref 4.0–10.5)

## 2018-10-31 LAB — C.DIFFICILE TOXIN: C. Difficile Toxin A: NOT DETECTED

## 2018-10-31 LAB — BASIC METABOLIC PANEL
BUN: 14 mg/dL (ref 6–23)
CO2: 28 mEq/L (ref 19–32)
Calcium: 9.3 mg/dL (ref 8.4–10.5)
Chloride: 103 mEq/L (ref 96–112)
Creatinine, Ser: 0.82 mg/dL (ref 0.40–1.50)
GFR: 91.93 mL/min (ref >60.00)
Glucose, Bld: 98 mg/dL (ref 70–99)
Potassium: 4.2 mEq/L (ref 3.5–5.1)
Sodium: 137 mEq/L (ref 135–145)

## 2018-10-31 LAB — HEPATIC FUNCTION PANEL
ALT: 23 U/L (ref 0–53)
AST: 19 U/L (ref 0–37)
Albumin: 4.2 g/dL (ref 3.5–5.2)
Alkaline Phosphatase: 87 U/L (ref 39–117)
Bilirubin, Direct: 0.1 mg/dL (ref 0.0–0.3)
Total Bilirubin: 0.4 mg/dL (ref 0.2–1.2)
Total Protein: 6.7 g/dL (ref 6.0–8.3)

## 2018-10-31 LAB — HEMOGLOBIN A1C: Hgb A1c MFr Bld: 6 % (ref 4.6–6.5)

## 2018-10-31 NOTE — Progress Notes (Signed)
° °Subjective:  °Patient ID: Joshua Cross, male    DOB: 02/06/1946  Age: 73 y.o. MRN: [id number] ° °CC: Diarrhea ° ° °HPI °Joshua Cross presents for concerns about a 4-day history of watery diarrhea.  He is having about 3-4 watery bowel movements a day.  He has not taken anything to control the diarrhea.  The diarrhea has improved some today.  He thinks it started after he ate an over ripe pear.  He denies nausea, vomiting, fever, chills, loss of appetite, dizziness, lightheadedness, cramping, bloody stool, or rash. ° °Outpatient Medications Prior to Visit  °Medication Sig Dispense Refill  °• albuterol (PROVENTIL HFA;VENTOLIN HFA) 108 (90 Base) MCG/ACT inhaler INHALE 2 PUFFS INTO THE LUNGS EVERY 6 HOURS AS NEEDED FOR WHEEZING OR SHORTNESS OF BREATH 18 g 3  °• budesonide (PULMICORT) 0.5 MG/2ML nebulizer solution INHALE CONTENTS OF 1 VIAL VIA NEBULIZER TWICE DAILY 120 mL 11  °• cholecalciferol (VITAMIN D) 1000 units tablet Take 2,000 Units by mouth daily.    °• CHOLINE PO Take 2 tablets by mouth daily.    °• Glycopyrrolate (LONHALA MAGNAIR STARTER KIT) 25 MCG/ML SOLN Inhale 1 Act into the lungs 2 (two) times daily. 60 mL 1  °• ipratropium (ATROVENT) 0.03 % nasal spray INSTILL 2 SPRAYS INTO EACH NOSTRIL EVERY 12 HOURS 30 mL 5  °• LONHALA MAGNAIR REFILL KIT 25 MCG/ML SOLN INHALE 1 DOSE INTO THE LUNGS TWICE A DAY 60 mL 11  °• LUMIGAN 0.01 % SOLN Instill 1 drop into both eyes once daily at bedtime  0  °• magnesium gluconate (MAGONATE) 500 MG tablet Take 500 mg by mouth daily.    °• Melatonin 10 MG CAPS Take 10 mg by mouth daily.     °• Omega-3 Fatty Acids (FISH OIL) 1200 MG CAPS Take 1,200 mg by mouth 2 (two) times daily.     °• PARoxetine (PAXIL) 20 MG tablet TAKE 1 TABLET BY MOUTH ONCE DAILY 90 tablet 1  °• Potassium 99 MG TABS Take 1 tablet by mouth daily.     °• Probiotic Product (PROBIOTIC ADVANCED PO) Take 1 capsule by mouth daily. From deep roots     °• roflumilast (DALIRESP) 500 MCG TABS tablet Take 1  tablet (500 mcg total) by mouth daily. 90 tablet 1  °• rosuvastatin (CRESTOR) 10 MG tablet Take 1 tablet (10 mg total) by mouth daily. 90 tablet 1  °• TURMERIC PO Take 1 tablet by mouth daily.     °• BROVANA 15 MCG/2ML NEBU INHALE CONTENTS OF 1 VIAL IN NEBULIZER TWICE A DAY 120 mL 5  ° °No facility-administered medications prior to visit.   ° ° °ROS °Review of Systems  °Constitutional: Negative for appetite change, chills, diaphoresis, fatigue and fever.  °HENT: Negative.  Negative for trouble swallowing.   °Eyes: Negative.   °Respiratory: Negative for cough, chest tightness, shortness of breath and wheezing.   °Cardiovascular: Negative for chest pain, palpitations and leg swelling.  °Gastrointestinal: Positive for diarrhea. Negative for abdominal pain, blood in stool, nausea and vomiting.  °Endocrine: Negative.   °Genitourinary: Negative.  Negative for difficulty urinating, dysuria, hematuria and urgency.  °Musculoskeletal: Negative.  Negative for myalgias.  °Skin: Negative for rash.  °Neurological: Negative.  Negative for dizziness, weakness and light-headedness.  °Hematological: Negative for adenopathy. Does not bruise/bleed easily.  °Psychiatric/Behavioral: Negative.   ° ° °Objective:  °BP 124/74    Pulse 73    Temp 98 °F (36.7 °C) (Oral)    Resp 18      Ht 5' 7" (1.702 m)    Wt 121 lb (54.9 kg)    SpO2 92%    BMI 18.95 kg/m²  ° °BP Readings from Last 3 Encounters:  °10/31/18 124/74  °05/31/18 (!) 160/74  °05/15/18 130/70  ° ° °Wt Readings from Last 3 Encounters:  °10/31/18 121 lb (54.9 kg)  °05/31/18 124 lb 12.8 oz (56.6 kg)  °05/15/18 127 lb (57.6 kg)  ° ° °Physical Exam °Vitals signs reviewed.  °Constitutional:   °   General: He is not in acute distress. °   Appearance: He is not ill-appearing, toxic-appearing or diaphoretic.  °HENT:  °   Mouth/Throat:  °   Mouth: Mucous membranes are moist.  °Eyes:  °   General: No scleral icterus. °   Conjunctiva/sclera: Conjunctivae normal.  °Neck:  °   Musculoskeletal:  Normal range of motion. No neck rigidity.  °Cardiovascular:  °   Rate and Rhythm: Normal rate and regular rhythm.  °   Heart sounds: No murmur. No gallop.   °Pulmonary:  °   Effort: Pulmonary effort is normal.  °   Breath sounds: No stridor. No wheezing, rhonchi or rales.  °Abdominal:  °   General: Abdomen is flat. Bowel sounds are normal. There is no distension.  °   Palpations: There is no hepatomegaly or splenomegaly.  °   Tenderness: There is no abdominal tenderness.  °Musculoskeletal: Normal range of motion.  °   Right lower leg: No edema.  °   Left lower leg: No edema.  °Lymphadenopathy:  °   Cervical: No cervical adenopathy.  °Skin: °   General: Skin is warm and dry.  °   Coloration: Skin is not pale.  °   Findings: No rash.  °Neurological:  °   General: No focal deficit present.  °   Mental Status: He is alert.  °Psychiatric:     °   Mood and Affect: Mood normal.     °   Behavior: Behavior normal.  ° ° ° °Lab Results  °Component Value Date  ° WBC 8.9 10/31/2018  ° HGB 14.4 10/31/2018  ° HCT 42.6 10/31/2018  ° PLT 313.0 10/31/2018  ° GLUCOSE 98 10/31/2018  ° CHOL 193 05/02/2018  ° TRIG 92.0 05/02/2018  ° HDL 97.20 05/02/2018  ° LDLCALC 77 05/02/2018  ° ALT 23 10/31/2018  ° AST 19 10/31/2018  ° NA 137 10/31/2018  ° K 4.2 10/31/2018  ° CL 103 10/31/2018  ° CREATININE 0.82 10/31/2018  ° BUN 14 10/31/2018  ° CO2 28 10/31/2018  ° TSH 2.92 05/02/2018  ° PSA 0.91 05/02/2018  ° HGBA1C 6.0 10/31/2018  ° MICROALBUR 1.4 05/02/2018  ° ° °Dg Chest 2 View ° °Result Date: 05/15/2018 °CLINICAL DATA:  Chronic dyspnea; worsening x 2 mo. Hx of COPD, emphysema, right lung sx. Ex smoker. EXAM: CHEST - 2 VIEW COMPARISON:  04/30/2017 FINDINGS: Lungs are hyperinflated. Marked emphysematous changes. There is perihilar peribronchial thickening, stable and chronic. There are no new consolidations or pleural effusions. No pulmonary edema. Surgical clips are identified in the MEDIAL aspect of the RIGHT lung apex. Remote rib fractures.  IMPRESSION: Chronic, marked hyperinflation/emphysematous changes of the lungs. The superimposed infectious process. Electronically Signed   By: Elizabeth  Brown M.D.   On: 05/15/2018 13:54  ° ° °Assessment & Plan:  ° °Satoshi was seen today for diarrhea. ° °Diagnoses and all orders for this visit: ° °Diarrhea of presumed infectious origin- His blood work is normal indicating no   evidence of dehydration or systemic illness.  C. difficile toxin is negative.  I will screen for enteric pathogens.  For now, I will treat him for viral gastroenteritis.  I recommended that he take Imodium as needed. -     CBC with Differential/Platelet; Future -     Basic metabolic panel; Future -     C. Difficile Toxin; Future -     Hepatic function panel; Future -     Gastrointestinal Pathogen Panel PCR; Future -     Fecal lactoferrin, quant; Future  Type II diabetes mellitus with manifestations ([place])- His A1c is 6.0%.  His blood sugars are adequately well controlled. -     Hemoglobin A1c; Future -     Hepatic function panel; Future   I am having [name] "Jim" maintain his Lumigan, Probiotic Product (PROBIOTIC ADVANCED PO), magnesium gluconate, Potassium, TURMERIC PO, Fish Oil, Melatonin, Glycopyrrolate, cholecalciferol, CHOLINE PO, ipratropium, Lonhala Magnair Refill Kit, budesonide, rosuvastatin, albuterol, PARoxetine, and Daliresp.  No orders of the defined types were placed in this encounter.    Follow-up: Return if symptoms worsen or fail to improve.  Scarlette Calico, MD

## 2018-10-31 NOTE — Patient Instructions (Signed)
Viral Gastroenteritis, Adult  Viral gastroenteritis is also known as the stomach flu. This condition may affect your stomach, small intestine, and large intestine. It can cause sudden watery diarrhea, fever, and vomiting. This condition is caused by many different viruses. These viruses can be passed from person to person very easily (are contagious). Diarrhea and vomiting can make you feel weak and cause you to become dehydrated. You may not be able to keep fluids down. Dehydration can make you tired and thirsty, cause you to have a dry mouth, and decrease how often you urinate. It is important to replace the fluids that you lose from diarrhea and vomiting. What are the causes? Gastroenteritis is caused by many viruses, including rotavirus and norovirus. Norovirus is the most common cause in adults. You can get sick after being exposed to the viruses from other people. You can also get sick by:  Eating food, drinking water, or touching a surface contaminated with one of these viruses.  Sharing utensils or other personal items with an infected person. What increases the risk? You are more likely to develop this condition if you:  Have a weak body defense system (immune system).  Live with one or more children who are younger than 2 years old.  Live in a nursing home.  Travel on cruise ships. What are the signs or symptoms? Symptoms of this condition start suddenly 1-3 days after exposure to a virus. Symptoms may last for a few days or for as long as a week. Common symptoms include watery diarrhea and vomiting. Other symptoms include:  Fever.  Headache.  Fatigue.  Pain in the abdomen.  Chills.  Weakness.  Nausea.  Muscle aches.  Loss of appetite. How is this diagnosed? This condition is diagnosed with a medical history and physical exam. You may also have a stool test to check for viruses or other infections. How is this treated? This condition typically goes away on its  own. The focus of treatment is to prevent dehydration and restore lost fluids (rehydration). This condition may be treated with:  An oral rehydration solution (ORS) to replace important salts and minerals (electrolytes) in your body. Take this if told by your health care provider. This is a drink that is sold at pharmacies and retail stores.  Medicines to help with your symptoms.  Probiotic supplements to reduce symptoms of diarrhea.  Fluids given through an IV, if dehydration is severe. Older adults and people with other diseases or a weak immune system are at higher risk for dehydration. Follow these instructions at home:  Eating and drinking   Take an ORS as told by your health care provider.  Drink clear fluids in small amounts as you are able. Clear fluids include: ? Water. ? Ice chips. ? Diluted fruit juice. ? Low-calorie sports drinks.  Drink enough fluid to keep your urine pale yellow.  Eat small amounts of healthy foods every 3-4 hours as you are able. This may include whole grains, fruits, vegetables, lean meats, and yogurt.  Avoid fluids that contain a lot of sugar or caffeine, such as energy drinks, sports drinks, and soda.  Avoid spicy or fatty foods.  Avoid alcohol. General instructions  Wash your hands often, especially after having diarrhea or vomiting. If soap and water are not available, use hand sanitizer.  Make sure that all people in your household wash their hands well and often.  Take over-the-counter and prescription medicines only as told by your health care provider.  Rest at   home while you recover.  Watch your condition for any changes.  Take a warm bath to relieve any burning or pain from frequent diarrhea episodes.  Keep all follow-up visits as told by your health care provider. This is important. Contact a health care provider if you:  Cannot keep fluids down.  Have symptoms that get worse.  Have new symptoms.  Feel light-headed or  dizzy.  Have muscle cramps. Get help right away if you:  Have chest pain.  Feel extremely weak or you faint.  See blood in your vomit.  Have vomit that looks like coffee grounds.  Have bloody or black stools or stools that look like tar.  Have a severe headache, a stiff neck, or both.  Have a rash.  Have severe pain, cramping, or bloating in your abdomen.  Have trouble breathing or you are breathing very quickly.  Have a fast heartbeat.  Have skin that feels cold and clammy.  Feel confused.  Have pain when you urinate.  Have signs of dehydration, such as: ? Dark urine, very little urine, or no urine. ? Cracked lips. ? Dry mouth. ? Sunken eyes. ? Sleepiness. ? Weakness. Summary  Viral gastroenteritis is also known as the stomach flu. It can cause sudden watery diarrhea, fever, and vomiting.  This condition can be passed from person to person very easily (is contagious).  Take an ORS if told by your health care provider. This is a drink that is sold at pharmacies and retail stores.  Wash your hands often, especially after having diarrhea or vomiting. If soap and water are not available, use hand sanitizer. This information is not intended to replace advice given to you by your health care provider. Make sure you discuss any questions you have with your health care provider. Document Released: 03/28/2005 Document Revised: 01/31/2018 Document Reviewed: 01/31/2018 Elsevier Patient Education  2020 [employer].

## 2018-11-01 ENCOUNTER — Other Ambulatory Visit: Payer: Self-pay | Admitting: Internal Medicine

## 2018-11-01 DIAGNOSIS — J418 Mixed simple and mucopurulent chronic bronchitis: Secondary | ICD-10-CM

## 2018-11-05 ENCOUNTER — Encounter: Payer: Self-pay | Admitting: Internal Medicine

## 2018-11-08 ENCOUNTER — Other Ambulatory Visit: Payer: Self-pay | Admitting: Internal Medicine

## 2018-11-08 DIAGNOSIS — E7800 Pure hypercholesterolemia, unspecified: Secondary | ICD-10-CM

## 2018-11-08 MED ORDER — ROSUVASTATIN CALCIUM 10 MG PO TABS: 10.0000 mg | ORAL_TABLET | Freq: Every day | ORAL | 1 refills | Status: DC

## 2018-11-21 ENCOUNTER — Ambulatory Visit (INDEPENDENT_AMBULATORY_CARE_PROVIDER_SITE_OTHER): Payer: Medicare Other | Admitting: *Deleted

## 2018-11-21 DIAGNOSIS — Z0000 Encounter for general adult medical examination without abnormal findings: Secondary | ICD-10-CM

## 2018-11-21 NOTE — Progress Notes (Addendum)
Subjective:   Joshua Cross is a 73 y.o. male who presents for Medicare Annual/Subsequent preventive examination. I connected with patient by a telephone and verified that I am speaking with the correct person using two identifiers. Patient stated full name and DOB. Patient gave permission to continue with telephonic visit. Patient's location was at home and Nurse's location was at [place] office.   Review of Systems:   Cardiac Risk Factors include: advanced age (>76mn, >>31women);diabetes mellitus;dyslipidemia Sleep patterns: feels rested on waking, gets up 1-2 times nightly to void and sleeps 6-7 hours nightly.    Home Safety/Smoke Alarms: Feels safe in home. Smoke alarms in place.  Living environment; residence and Firearm Safety: 1-story house/ trailer. Lives alone, no needs for DME, good support system Seat Belt Safety/Bike Helmet: Wears seat belt.   PSA-  Lab Results  Component Value Date   PSA 0.91 05/02/2018       Objective:    Vitals: There were no vitals taken for this visit.  There is no height or weight on file to calculate BMI.  Advanced Directives 11/21/2018 11/15/2017 04/30/2017 12/07/2016 11/24/2016  Does Patient Have a Medical Advance Directive? Yes No No No No  Type [place] - - - -  Copy of [place] Chart? No - copy requested - - - -  Would patient like information on creating a medical advance directive? - Yes (ED - Information included in AVS) - No - Patient declined -    Tobacco Social History   Tobacco Use  Smoking Status Former Smoker  . Packs/day: 0.25  . Years: 50.00  . Pack years: 12.50  . Types: Cigarettes  . Quit date: 08/06/2004  . Years since quitting: 14.3  Smokeless Tobacco Never Used     Counseling given: Not Answered  Past Medical History:  Diagnosis Date  . Cataract   . COPD (chronic obstructive pulmonary disease) (HUlm   . Hyperlipidemia    Past  Surgical History:  Procedure Laterality Date  . cataracts Bilateral 2019  . HERNIA REPAIR    . LUNG REMOVAL, PARTIAL Right 1985  . TONSILLECTOMY     Family History  Problem Relation Age of Onset  . Alcohol abuse Mother   . Stroke Father   . Depression Sister   . Heart disease Sister   . Cancer Neg Hx   . COPD Neg Hx   . Early death Neg Hx   . Kidney disease Neg Hx   . Hypertension Neg Hx   . Learning disabilities Neg Hx    Social History   Socioeconomic History  . Marital status: Divorced    Spouse name: Not on file  . Number of children: Not on file  . Years of education: Not on file  . Highest education level: Not on file  Occupational History  . Occupation: retired  SScientific laboratory technician . Financial resource strain: Not hard at all  . Food insecurity    Worry: Never true    Inability: Never true  . Transportation needs    Medical: No    Non-medical: No  Tobacco Use  . Smoking status: Former Smoker    Packs/day: 0.25    Years: 50.00    Pack years: 12.50    Types: Cigarettes    Quit date: 08/06/2004    Years since quitting: 14.3  . Smokeless tobacco: Never Used  Substance and Sexual Activity  . Alcohol use: No  .  Drug use: No  . Sexual activity: Not Currently  Lifestyle  . Physical activity    Days per week: 3 days    Minutes per session: 40 min  . Stress: Only a little  Relationships  . Social connections    Talks on phone: More than three times a week    Gets together: More than three times a week    Attends religious service: Not on file    Active member of club or organization: Not on file    Attends meetings of clubs or organizations: Not on file    Relationship status: Not on file  Other Topics Concern  . Not on file  Social History Narrative   Patient is from [place], moved to [place] to attend school at [employer], then started work [employer] studies at college in [place]. 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 He is an  avid reader and active in [place].    Outpatient Encounter Medications as of 11/21/2018  Medication Sig  . albuterol (PROVENTIL HFA;VENTOLIN HFA) 108 (90 Base) MCG/ACT inhaler INHALE 2 PUFFS INTO THE LUNGS EVERY 6 HOURS AS NEEDED FOR WHEEZING OR SHORTNESS OF BREATH  . BROVANA 15 MCG/2ML NEBU INHALE CONTENTS OF 1 VIAL IN NEBULIZER TWICE A DAY  . budesonide (PULMICORT) 0.5 MG/2ML nebulizer solution INHALE CONTENTS OF 1 VIAL VIA NEBULIZER TWICE DAILY  . cholecalciferol (VITAMIN D) 1000 units tablet Take 2,000 Units by mouth daily.  . CHOLINE PO Take 2 tablets by mouth daily.  . Glycopyrrolate (LONHALA MAGNAIR STARTER KIT) 25 MCG/ML SOLN Inhale 1 Act into the lungs 2 (two) times daily.  Marland Kitchen ipratropium (ATROVENT) 0.03 % nasal spray INSTILL 2 SPRAYS INTO EACH NOSTRIL EVERY 12 HOURS  . LONHALA MAGNAIR REFILL KIT 25 MCG/ML SOLN INHALE 1 DOSE INTO THE LUNGS TWICE A DAY  . LUMIGAN 0.01 % SOLN Instill 1 drop into both eyes once daily at bedtime  . magnesium gluconate (MAGONATE) 500 MG tablet Take 500 mg by mouth daily.  . Melatonin 10 MG CAPS Take 10 mg by mouth daily.   . Omega-3 Fatty Acids (FISH OIL) 1200 MG CAPS Take 1,200 mg by mouth 2 (two) times daily.   Marland Kitchen PARoxetine (PAXIL) 20 MG tablet TAKE 1 TABLET BY MOUTH ONCE DAILY  . Potassium 99 MG TABS Take 1 tablet by mouth daily.   . Probiotic Product (PROBIOTIC ADVANCED PO) Take 1 capsule by mouth daily. From deep roots   . roflumilast (DALIRESP) 500 MCG TABS tablet Take 1 tablet (500 mcg total) by mouth daily.  . rosuvastatin (CRESTOR) 10 MG tablet Take 1 tablet (10 mg total) by mouth daily.  . TURMERIC PO Take 1 tablet by mouth daily.    No facility-administered encounter medications on file as of 11/21/2018.     Activities of Daily Living In your present state of health, do you have any difficulty performing the following activities: 11/21/2018  Hearing? N  Vision? N  Difficulty concentrating or making decisions? N  Walking or climbing stairs?  N  Dressing or bathing? N  Doing errands, shopping? N  Preparing Food and eating ? N  Using the Toilet? N  In the past six months, have you accidently leaked urine? N  Do you have problems with loss of bowel control? N  Managing your Medications? N  Managing your Finances? N  Housekeeping or managing your Housekeeping? N  Some recent data might be hidden    Patient Care Team:  Janith Lima, MD as PCP - General (Internal Medicine) Irene Shipper, MD as Consulting Physician (Gastroenterology)   Assessment:   This is a routine wellness examination for Rik. Physical assessment deferred to PCP.  Exercise Activities and Dietary recommendations Current Exercise Habits: The patient does not participate in regular exercise at present, Intensity: Mild Diet (meal preparation, eat out, water intake, caffeinated beverages, dairy products, fruits and vegetables): in general, a "healthy" diet  , well balanced. eats a variety of fruits and vegetables daily, limits salt, fat/cholesterol, sugar,carbohydrates,caffeine, drinks 6-8 glasses of water daily.  Goals    . Patient Stated     Continue to be socially engaged with people, enjoy life, family and friends. Enjoy staying updated by reading my papers.         Fall Risk Fall Risk  11/21/2018 11/15/2017  Falls in the past year? 0 No  Number falls in past yr: 0 -  Injury with Fall? 0 -  Risk for fall due to : History of fall(s) -   Depression Screen PHQ 2/9 Scores 11/21/2018 11/15/2017  PHQ - 2 Score 0 0  PHQ- 9 Score - 0    Cognitive Function       Ad8 score reviewed for issues:  Issues making decisions: no  Less interest in hobbies / activities: no  Repeats questions, stories (family complaining): no  Trouble using ordinary gadgets (microwave, computer, phone):no  Forgets the month or year: no  Mismanaging finances: no  Remembering appts: no  Daily problems with thinking and/or memory: no Ad8 score is= 0  Immunization  History  Administered Date(s) Administered  . DTaP 03/07/2016  . Influenza Split 02/01/2014, 12/31/2014, 01/24/2018  . Influenza, Seasonal, Injecte, Preservative Fre 02/13/2010  . Influenza-Unspecified 03/07/2016, 12/15/2016, 02/04/2018  . Pneumococcal Conjugate-13 07/27/2013  . Pneumococcal Polysaccharide-23 02/12/2010  . Pneumococcal-Unspecified 03/08/2015  . Tdap 05/27/2015  . Zoster 03/08/2011   Screening Tests Health Maintenance  Topic Date Due  . INFLUENZA VACCINE  11/10/2018  . OPHTHALMOLOGY EXAM  04/19/2019  . HEMOGLOBIN A1C  05/03/2019  . URINE MICROALBUMIN  05/03/2019  . FOOT EXAM  05/04/2019  . COLONOSCOPY  01/19/2020  . TETANUS/TDAP  05/26/2025  . Hepatitis C Screening  Completed  . PNA vac Low Risk Adult  Completed       Plan:    Reviewed health maintenance screenings with patient today and relevant education, vaccines, and/or referrals were provided.   Continue to eat heart healthy diet (full of fruits, vegetables, whole grains, lean protein, water--limit salt, fat, and sugar intake) and increase physical activity as tolerated.  Continue doing brain stimulating activities (puzzles, reading, adult coloring books, staying active) to keep memory sharp.   I have personally reviewed and noted the following in the patient's chart:   . Medical and social history . Use of alcohol, tobacco or illicit drugs  . Current medications and supplements . Functional ability and status . Nutritional status . Physical activity . Advanced directives . List of other physicians . Screenings to include cognitive, depression, and falls . Referrals and appointments  In addition, I have reviewed and discussed with patient certain preventive protocols, quality metrics, and best practice recommendations. A written personalized care plan for preventive services as well as general preventive health recommendations were provided to patient.     Michiel Cowboy, RN  11/21/2018  Medical  screening examination/treatment/procedure(s) were performed by non-physician practitioner and as supervising physician I was immediately available for consultation/collaboration. I agree with above. Marcello Moores  Ronnald Ramp, MD

## 2018-12-13 ENCOUNTER — Encounter: Payer: Self-pay | Admitting: Internal Medicine

## 2018-12-13 ENCOUNTER — Other Ambulatory Visit: Payer: Self-pay

## 2018-12-13 ENCOUNTER — Ambulatory Visit (INDEPENDENT_AMBULATORY_CARE_PROVIDER_SITE_OTHER): Payer: Medicare Other | Admitting: Internal Medicine

## 2018-12-13 ENCOUNTER — Other Ambulatory Visit: Payer: Medicare Other

## 2018-12-13 VITALS — BP 138/60 | HR 69 | Temp 98.1°F | Resp 20 | Ht 67.0 in | Wt 125.2 lb

## 2018-12-13 DIAGNOSIS — L989 Disorder of the skin and subcutaneous tissue, unspecified: Secondary | ICD-10-CM

## 2018-12-13 DIAGNOSIS — C44529 Squamous cell carcinoma of skin of other part of trunk: Secondary | ICD-10-CM | POA: Diagnosis not present

## 2018-12-13 NOTE — Progress Notes (Signed)
Subjective:  Patient ID: Joshua Cross, male    DOB: 1945/07/01  Age: 73 y.o. MRN: [id number]  CC: Rash   HPI Joshua Cross presents for concern about a skin lesion over his left clavicle.  He says is been there at least a month.  It is growing and sometimes bleeds.  Outpatient Medications Prior to Visit  Medication Sig Dispense Refill  . albuterol (PROVENTIL HFA;VENTOLIN HFA) 108 (90 Base) MCG/ACT inhaler INHALE 2 PUFFS INTO THE LUNGS EVERY 6 HOURS AS NEEDED FOR WHEEZING OR SHORTNESS OF BREATH 18 g 3  . BROVANA 15 MCG/2ML NEBU INHALE CONTENTS OF 1 VIAL IN NEBULIZER TWICE A DAY 120 mL 5  . budesonide (PULMICORT) 0.5 MG/2ML nebulizer solution INHALE CONTENTS OF 1 VIAL VIA NEBULIZER TWICE DAILY 120 mL 11  . cholecalciferol (VITAMIN D) 1000 units tablet Take 2,000 Units by mouth daily.    . CHOLINE PO Take 2 tablets by mouth daily.    . Glycopyrrolate (LONHALA MAGNAIR STARTER KIT) 25 MCG/ML SOLN Inhale 1 Act into the lungs 2 (two) times daily. 60 mL 1  . ipratropium (ATROVENT) 0.03 % nasal spray INSTILL 2 SPRAYS INTO EACH NOSTRIL EVERY 12 HOURS 30 mL 5  . LONHALA MAGNAIR REFILL KIT 25 MCG/ML SOLN INHALE 1 DOSE INTO THE LUNGS TWICE A DAY 60 mL 11  . LUMIGAN 0.01 % SOLN Instill 1 drop into both eyes once daily at bedtime  0  . magnesium gluconate (MAGONATE) 500 MG tablet Take 500 mg by mouth daily.    . Melatonin 10 MG CAPS Take 10 mg by mouth daily.     . Omega-3 Fatty Acids (FISH OIL) 1200 MG CAPS Take 1,200 mg by mouth 2 (two) times daily.     Marland Kitchen PARoxetine (PAXIL) 20 MG tablet TAKE 1 TABLET BY MOUTH ONCE DAILY 90 tablet 1  . Potassium 99 MG TABS Take 1 tablet by mouth daily.     . Probiotic Product (PROBIOTIC ADVANCED PO) Take 1 capsule by mouth daily. From deep roots     . roflumilast (DALIRESP) 500 MCG TABS tablet Take 1 tablet (500 mcg total) by mouth daily. 90 tablet 1  . rosuvastatin (CRESTOR) 10 MG tablet Take 1 tablet (10 mg total) by mouth daily. 90 tablet 1  .  TURMERIC PO Take 1 tablet by mouth daily.      No facility-administered medications prior to visit.     ROS Review of Systems  All other systems reviewed and are negative.   Objective:  BP 138/60 (BP Location: Left Arm, Patient Position: Sitting, Cuff Size: Normal)   Pulse 69   Temp 98.1 F (36.7 C) (Oral)   Resp 20   Ht '5\' 7"'$  (1.702 m)   Wt 125 lb 4 oz (56.8 kg)   SpO2 94%   BMI 19.62 kg/m   BP Readings from Last 3 Encounters:  12/13/18 138/60  10/31/18 124/74  05/31/18 (!) 160/74    Wt Readings from Last 3 Encounters:  12/13/18 125 lb 4 oz (56.8 kg)  10/31/18 121 lb (54.9 kg)  05/31/18 124 lb 12.8 oz (56.6 kg)    Physical Exam Chest:       Lab Results  Component Value Date   WBC 8.9 10/31/2018   HGB 14.4 10/31/2018   HCT 42.6 10/31/2018   PLT 313.0 10/31/2018   GLUCOSE 98 10/31/2018   CHOL 193 05/02/2018   TRIG 92.0 05/02/2018   HDL 97.20 05/02/2018   LDLCALC 77 05/02/2018  ALT 23 10/31/2018   AST 19 10/31/2018   NA 137 10/31/2018   K 4.2 10/31/2018   CL 103 10/31/2018   CREATININE 0.82 10/31/2018   BUN 14 10/31/2018   CO2 28 10/31/2018   TSH 2.92 05/02/2018   PSA 0.91 05/02/2018   HGBA1C 6.0 10/31/2018   MICROALBUR 1.4 05/02/2018    Dg Chest 2 View  Result Date: 05/15/2018 CLINICAL DATA:  Chronic dyspnea; worsening x 2 mo. Hx of COPD, emphysema, right lung sx. Ex smoker. EXAM: CHEST - 2 VIEW COMPARISON:  04/30/2017 FINDINGS: Lungs are hyperinflated. Marked emphysematous changes. There is perihilar peribronchial thickening, stable and chronic. There are no new consolidations or pleural effusions. No pulmonary edema. Surgical clips are identified in the MEDIAL aspect of the RIGHT lung apex. Remote rib fractures. IMPRESSION: Chronic, marked hyperinflation/emphysematous changes of the   lungs. The superimposed infectious process. Electronically Signed   By: Nolon Nations M.D.   On: 05/15/2018 13:54    After informed verbal consent was  obtained, using Betadine for cleansing and 1% Lidocaine with epinephrine for anesthetic, 1.5 cc was used, with sterile technique a 6 mm punch biopsy was used to obtain a biopsy specimen of the lesion. Hemostasis was obtained by pressure and wound was was sutured with 2 interrupted Prolene sutures on a PC 4.. Antibiotic dressing is applied, and wound care instructions provided. The specimen is labeled and sent to pathology for evaluation. The procedure was well tolerated without complications.  Assessment & Plan:   Brantley was seen today for rash.  Diagnoses and all orders for this visit:  Skin lesion of chest wall- The specimen was removed with punch biopsy.  It is sent for pathology to screen for malignant skin lesion like melanoma or basal cell carcinoma.  This could also be an inflamed actinic keratosis or seborrheic keratosis. -     Cancel: Dermatology pathology -     Dermatology pathology; Future   I am having [name] "Jim" maintain his Lumigan, Probiotic Product (PROBIOTIC ADVANCED PO), magnesium gluconate, Potassium, TURMERIC PO, Fish Oil, Melatonin, Glycopyrrolate, cholecalciferol, CHOLINE PO, ipratropium, [place] Magnair Refill Kit, budesonide, albuterol, PARoxetine, Daliresp, Brovana, and rosuvastatin.  No orders of the defined types were placed in this encounter.    Follow-up: Return in about 1 week (around 12/20/2018).  Scarlette Calico, MD

## 2018-12-13 NOTE — Patient Instructions (Signed)
Wound Care, Adult Taking care of your wound properly can help to prevent pain, infection, and scarring. It can also help your wound to heal more quickly. How to care for your wound Wound care      Follow instructions from your health care provider about how to take care of your wound. Make sure you: ? Wash your hands with soap and water before you change the bandage (dressing). If soap and water are not available, use hand sanitizer. ? Change your dressing as told by your health care provider. ? Leave stitches (sutures), skin glue, or adhesive strips in place. These skin closures may need to stay in place for 2 weeks or longer. If adhesive strip edges start to loosen and curl up, you may trim the loose edges. Do not remove adhesive strips completely unless your health care provider tells you to do that.  Check your wound area every day for signs of infection. Check for: ? Redness, swelling, or pain. ? Fluid or blood. ? Warmth. ? Pus or a bad smell.  Ask your health care provider if you should clean the wound with mild soap and water. Doing this may include: ? Using a clean towel to pat the wound dry after cleaning it. Do not rub or scrub the wound. ? Applying a cream or ointment. Do this only as told by your health care provider. ? Covering the incision with a clean dressing.  Ask your health care provider when you can leave the wound uncovered.  Keep the dressing dry until your health care provider says it can be removed. Do not take baths, swim, use a hot tub, or do anything that would put the wound underwater until your health care provider approves. Ask your health care provider if you can take showers. You may only be allowed to take sponge baths. Medicines   If you were prescribed an antibiotic medicine, cream, or ointment, take or use the antibiotic as told by your health care provider. Do not stop taking or using the antibiotic even if your condition improves.  Take  over-the-counter and prescription medicines only as told by your health care provider. If you were prescribed pain medicine, take it 30 or more minutes before you do any wound care or as told by your health care provider. General instructions  Return to your normal activities as told by your health care provider. Ask your health care provider what activities are safe.  Do not scratch or pick at the wound.  Do not use any products that contain nicotine or tobacco, such as cigarettes and e-cigarettes. These may delay wound healing. If you need help quitting, ask your health care provider.  Keep all follow-up visits as told by your health care provider. This is important.  Eat a diet that includes protein, vitamin A, vitamin C, and other nutrient-rich foods to help the wound heal. ? Foods rich in protein include meat, dairy, beans, nuts, and other sources. ? Foods rich in vitamin A include carrots and dark green, leafy vegetables. ? Foods rich in vitamin C include citrus, tomatoes, and other fruits and vegetables. ? Nutrient-rich foods have protein, carbohydrates, fat, vitamins, or minerals. Eat a variety of healthy foods including vegetables, fruits, and whole grains. Contact a health care provider if:  You received a tetanus shot and you have swelling, severe pain, redness, or bleeding at the injection site.  Your pain is not controlled with medicine.  You have redness, swelling, or pain around the wound.    You have fluid or blood coming from the wound.  Your wound feels warm to the touch.  You have pus or a bad smell coming from the wound.  You have a fever or chills.  You are nauseous or you vomit.  You are dizzy. Get help right away if:  You have a red streak going away from your wound.  The edges of the wound open up and separate.  Your wound is bleeding, and the bleeding does not stop with gentle pressure.  You have a rash.  You faint.  You have trouble breathing.  Summary  Always wash your hands with soap and water before changing your bandage (dressing).  To help with healing, eat foods that are rich in protein, vitamin A, vitamin C, and other nutrients.  Check your wound every day for signs of infection. Contact your health care provider if you suspect that your wound is infected. This information is not intended to replace advice given to you by your health care provider. Make sure you discuss any questions you have with your health care provider. Document Released: 01/05/2008 Document Revised: 07/16/2018 Document Reviewed: 10/13/2015 Elsevier Patient Education  2020 [employer]  

## 2018-12-18 ENCOUNTER — Encounter: Payer: Self-pay | Admitting: Internal Medicine

## 2018-12-18 ENCOUNTER — Other Ambulatory Visit: Payer: Self-pay | Admitting: Internal Medicine

## 2018-12-18 DIAGNOSIS — D049 Carcinoma in situ of skin, unspecified: Secondary | ICD-10-CM | POA: Insufficient documentation

## 2018-12-24 ENCOUNTER — Other Ambulatory Visit: Payer: Self-pay

## 2018-12-24 ENCOUNTER — Ambulatory Visit (INDEPENDENT_AMBULATORY_CARE_PROVIDER_SITE_OTHER): Payer: Medicare Other | Admitting: Internal Medicine

## 2018-12-24 ENCOUNTER — Encounter: Payer: Self-pay | Admitting: Internal Medicine

## 2018-12-24 VITALS — BP 118/80 | HR 65 | Temp 98.3°F | Resp 16 | Ht 67.0 in | Wt 126.0 lb

## 2018-12-24 DIAGNOSIS — D049 Carcinoma in situ of skin, unspecified: Secondary | ICD-10-CM

## 2018-12-24 NOTE — Progress Notes (Signed)
Subjective:  Patient ID: Joshua Cross, male    DOB: [birth year]  Age: 73 y.o. MRN: [id number]  CC: Wound Check   HPI Joshua Cross presents for f/up - He underwent a biopsy of a lesion over his left clavicle 10 days ago.  He comes in today for suture removal and recheck.  He tells me the area has healed nicely with no complications or symptoms.  The biopsy revealed squamous cell carcinoma.  Outpatient Medications Prior to Visit  Medication Sig Dispense Refill  . albuterol (PROVENTIL HFA;VENTOLIN HFA) 108 (90 Base) MCG/ACT inhaler INHALE 2 PUFFS INTO THE LUNGS EVERY 6 HOURS AS NEEDED FOR WHEEZING OR SHORTNESS OF BREATH 18 g 3  . BROVANA 15 MCG/2ML NEBU INHALE CONTENTS OF 1 VIAL IN NEBULIZER TWICE A DAY 120 mL 5  . budesonide (PULMICORT) 0.5 MG/2ML nebulizer solution INHALE CONTENTS OF 1 VIAL VIA NEBULIZER TWICE DAILY 120 mL 11  . cholecalciferol (VITAMIN D) 1000 units tablet Take 2,000 Units by mouth daily.    . CHOLINE PO Take 2 tablets by mouth daily.    . Glycopyrrolate (LONHALA MAGNAIR STARTER KIT) 25 MCG/ML SOLN Inhale 1 Act into the lungs 2 (two) times daily. 60 mL 1  . ipratropium (ATROVENT) 0.03 % nasal spray INSTILL 2 SPRAYS INTO EACH NOSTRIL EVERY 12 HOURS 30 mL 5  . LONHALA MAGNAIR REFILL KIT 25 MCG/ML SOLN INHALE 1 DOSE INTO THE LUNGS TWICE A DAY 60 mL 11  . LUMIGAN 0.01 % SOLN Instill 1 drop into both eyes once daily at bedtime  0  . magnesium gluconate (MAGONATE) 500 MG tablet Take 500 mg by mouth daily.    . Melatonin 10 MG CAPS Take 10 mg by mouth daily.     . Omega-3 Fatty Acids (FISH OIL) 1200 MG CAPS Take 1,200 mg by mouth 2 (two) times daily.     Marland Kitchen PARoxetine (PAXIL) 20 MG tablet TAKE 1 TABLET BY MOUTH ONCE DAILY 90 tablet 1  . Potassium 99 MG TABS Take 1 tablet by mouth daily.     . Probiotic Product (PROBIOTIC ADVANCED PO) Take 1 capsule by mouth daily. From deep roots     . roflumilast (DALIRESP) 500 MCG TABS tablet Take 1 tablet (500 mcg total) by  mouth daily. 90 tablet 1  . rosuvastatin (CRESTOR) 10 MG tablet Take 1 tablet (10 mg total) by mouth daily. 90 tablet 1  . TURMERIC PO Take 1 tablet by mouth daily.      No facility-administered medications prior to visit.     ROS Review of Systems  All other systems reviewed and are negative.   Objective:  BP 118/80 (BP Location: Left Arm, Patient Position: Sitting, Cuff Size: Normal)   Pulse 65   Temp 98.3 F (36.8 C) (Oral)   Resp 16   Ht 5' 7" (1.702 m)   Wt 126 lb (57.2 kg)   SpO2 93%   BMI 19.73 kg/m   BP Readings from Last 3 Encounters:  12/24/18 118/80  12/13/18 138/60  10/31/18 124/74    Wt Readings from Last 3 Encounters:  12/24/18 126 lb (57.2 kg)  12/13/18 125 lb 4 oz (56.8 kg)  10/31/18 121 lb (54.9 kg)    Physical Exam Chest:       Lab Results  Component Value Date   WBC 8.9 10/31/2018   HGB 14.4 10/31/2018   HCT 42.6 10/31/2018   PLT 313.0 10/31/2018   GLUCOSE 98 10/31/2018   CHOL  Subjective:  Patient ID: Joshua Cross, male    DOB: [birth year]  Age: 73 y.o. MRN: [id number]  CC: Wound Check   HPI Joshua Cross presents for f/up - He underwent a biopsy of a lesion over his left clavicle 10 days ago.  He comes in today for suture removal and recheck.  He tells me the area has healed nicely with no complications or symptoms.  The biopsy revealed squamous cell carcinoma.  Outpatient Medications Prior to Visit  Medication Sig Dispense Refill  . albuterol (PROVENTIL HFA;VENTOLIN HFA) 108 (90 Base) MCG/ACT inhaler INHALE 2 PUFFS INTO THE LUNGS EVERY 6 HOURS AS NEEDED FOR WHEEZING OR SHORTNESS OF BREATH 18 g 3  . BROVANA 15 MCG/2ML NEBU INHALE CONTENTS OF 1 VIAL IN NEBULIZER TWICE A DAY 120 mL 5  . budesonide (PULMICORT) 0.5 MG/2ML nebulizer solution INHALE CONTENTS OF 1 VIAL VIA NEBULIZER TWICE DAILY 120 mL 11  . cholecalciferol (VITAMIN D) 1000 units tablet Take 2,000 Units by mouth daily.    . CHOLINE PO Take 2 tablets by mouth daily.    . Glycopyrrolate (LONHALA MAGNAIR STARTER KIT) 25 MCG/ML SOLN Inhale 1 Act into the lungs 2 (two) times daily. 60 mL 1  . ipratropium (ATROVENT) 0.03 % nasal spray INSTILL 2 SPRAYS INTO EACH NOSTRIL EVERY 12 HOURS 30 mL 5  . LONHALA MAGNAIR REFILL KIT 25 MCG/ML SOLN INHALE 1 DOSE INTO THE LUNGS TWICE A DAY 60 mL 11  . LUMIGAN 0.01 % SOLN Instill 1 drop into both eyes once daily at bedtime  0  . magnesium gluconate (MAGONATE) 500 MG tablet Take 500 mg by mouth daily.    . Melatonin 10 MG CAPS Take 10 mg by mouth daily.     . Omega-3 Fatty Acids (FISH OIL) 1200 MG CAPS Take 1,200 mg by mouth 2 (two) times daily.     Marland Kitchen PARoxetine (PAXIL) 20 MG tablet TAKE 1 TABLET BY MOUTH ONCE DAILY 90 tablet 1  . Potassium 99 MG TABS Take 1 tablet by mouth daily.     . Probiotic Product (PROBIOTIC ADVANCED PO) Take 1 capsule by mouth daily. From deep roots     . roflumilast (DALIRESP) 500 MCG TABS tablet Take 1 tablet (500 mcg total) by  mouth daily. 90 tablet 1  . rosuvastatin (CRESTOR) 10 MG tablet Take 1 tablet (10 mg total) by mouth daily. 90 tablet 1  . TURMERIC PO Take 1 tablet by mouth daily.      No facility-administered medications prior to visit.     ROS Review of Systems  All other systems reviewed and are negative.   Objective:  BP 118/80 (BP Location: Left Arm, Patient Position: Sitting, Cuff Size: Normal)   Pulse 65   Temp 98.3 F (36.8 C) (Oral)   Resp 16   Ht 5' 7" (1.702 m)   Wt 126 lb (57.2 kg)   SpO2 93%   BMI 19.73 kg/m   BP Readings from Last 3 Encounters:  12/24/18 118/80  12/13/18 138/60  10/31/18 124/74    Wt Readings from Last 3 Encounters:  12/24/18 126 lb (57.2 kg)  12/13/18 125 lb 4 oz (56.8 kg)  10/31/18 121 lb (54.9 kg)    Physical Exam Chest:       Lab Results  Component Value Date   WBC 8.9 10/31/2018   HGB 14.4 10/31/2018   HCT 42.6 10/31/2018   PLT 313.0 10/31/2018   GLUCOSE 98 10/31/2018   CHOL

## 2018-12-25 ENCOUNTER — Encounter: Payer: Self-pay | Admitting: Internal Medicine

## 2018-12-25 NOTE — Patient Instructions (Signed)
Wound Care, Adult Taking care of your wound properly can help to prevent pain, infection, and scarring. It can also help your wound to heal more quickly. How to care for your wound Wound care      Follow instructions from your health care provider about how to take care of your wound. Make sure you: ? Wash your hands with soap and water before you change the bandage (dressing). If soap and water are not available, use hand sanitizer. ? Change your dressing as told by your health care provider. ? Leave stitches (sutures), skin glue, or adhesive strips in place. These skin closures may need to stay in place for 2 weeks or longer. If adhesive strip edges start to loosen and curl up, you may trim the loose edges. Do not remove adhesive strips completely unless your health care provider tells you to do that.  Check your wound area every day for signs of infection. Check for: ? Redness, swelling, or pain. ? Fluid or blood. ? Warmth. ? Pus or a bad smell.  Ask your health care provider if you should clean the wound with mild soap and water. Doing this may include: ? Using a clean towel to pat the wound dry after cleaning it. Do not rub or scrub the wound. ? Applying a cream or ointment. Do this only as told by your health care provider. ? Covering the incision with a clean dressing.  Ask your health care provider when you can leave the wound uncovered.  Keep the dressing dry until your health care provider says it can be removed. Do not take baths, swim, use a hot tub, or do anything that would put the wound underwater until your health care provider approves. Ask your health care provider if you can take showers. You may only be allowed to take sponge baths. Medicines   If you were prescribed an antibiotic medicine, cream, or ointment, take or use the antibiotic as told by your health care provider. Do not stop taking or using the antibiotic even if your condition improves.  Take  over-the-counter and prescription medicines only as told by your health care provider. If you were prescribed pain medicine, take it 30 or more minutes before you do any wound care or as told by your health care provider. General instructions  Return to your normal activities as told by your health care provider. Ask your health care provider what activities are safe.  Do not scratch or pick at the wound.  Do not use any products that contain nicotine or tobacco, such as cigarettes and e-cigarettes. These may delay wound healing. If you need help quitting, ask your health care provider.  Keep all follow-up visits as told by your health care provider. This is important.  Eat a diet that includes protein, vitamin A, vitamin C, and other nutrient-rich foods to help the wound heal. ? Foods rich in protein include meat, dairy, beans, nuts, and other sources. ? Foods rich in vitamin A include carrots and dark green, leafy vegetables. ? Foods rich in vitamin C include citrus, tomatoes, and other fruits and vegetables. ? Nutrient-rich foods have protein, carbohydrates, fat, vitamins, or minerals. Eat a variety of healthy foods including vegetables, fruits, and whole grains. Contact a health care provider if:  You received a tetanus shot and you have swelling, severe pain, redness, or bleeding at the injection site.  Your pain is not controlled with medicine.  You have redness, swelling, or pain around the wound.    You have fluid or blood coming from the wound.  Your wound feels warm to the touch.  You have pus or a bad smell coming from the wound.  You have a fever or chills.  You are nauseous or you vomit.  You are dizzy. Get help right away if:  You have a red streak going away from your wound.  The edges of the wound open up and separate.  Your wound is bleeding, and the bleeding does not stop with gentle pressure.  You have a rash.  You faint.  You have trouble breathing.  Summary  Always wash your hands with soap and water before changing your bandage (dressing).  To help with healing, eat foods that are rich in protein, vitamin A, vitamin C, and other nutrients.  Check your wound every day for signs of infection. Contact your health care provider if you suspect that your wound is infected. This information is not intended to replace advice given to you by your health care provider. Make sure you discuss any questions you have with your health care provider. Document Released: 01/05/2008 Document Revised: 07/16/2018 Document Reviewed: 10/13/2015 Elsevier Patient Education  2020 [employer]  

## 2019-01-09 ENCOUNTER — Encounter: Payer: Self-pay | Admitting: Internal Medicine

## 2019-03-08 ENCOUNTER — Other Ambulatory Visit: Payer: Self-pay | Admitting: Internal Medicine

## 2019-03-08 DIAGNOSIS — F3342 Major depressive disorder, recurrent, in full remission: Secondary | ICD-10-CM

## 2019-03-08 MED ORDER — PAROXETINE HCL 20 MG PO TABS: 20.0000 mg | ORAL_TABLET | Freq: Every day | ORAL | 1 refills | Status: DC

## 2019-04-10 ENCOUNTER — Other Ambulatory Visit: Payer: Self-pay | Admitting: Internal Medicine

## 2019-04-10 DIAGNOSIS — J418 Mixed simple and mucopurulent chronic bronchitis: Secondary | ICD-10-CM

## 2019-04-10 MED ORDER — DALIRESP 500 MCG PO TABS: 500.0000 ug | ORAL_TABLET | Freq: Every day | ORAL | 1 refills | Status: DC

## 2019-04-12 DIAGNOSIS — J449 Chronic obstructive pulmonary disease, unspecified: Secondary | ICD-10-CM | POA: Diagnosis not present

## 2019-04-17 ENCOUNTER — Telehealth: Payer: Self-pay

## 2019-04-17 DIAGNOSIS — J418 Mixed simple and mucopurulent chronic bronchitis: Secondary | ICD-10-CM

## 2019-04-17 NOTE — Telephone Encounter (Signed)
Key: [id number]

## 2019-04-22 ENCOUNTER — Encounter: Payer: Self-pay | Admitting: Internal Medicine

## 2019-04-23 ENCOUNTER — Encounter: Payer: Self-pay | Admitting: Internal Medicine

## 2019-04-25 ENCOUNTER — Encounter: Payer: Self-pay | Admitting: Internal Medicine

## 2019-04-26 ENCOUNTER — Other Ambulatory Visit: Payer: Self-pay | Admitting: Internal Medicine

## 2019-04-26 DIAGNOSIS — J418 Mixed simple and mucopurulent chronic bronchitis: Secondary | ICD-10-CM

## 2019-04-29 ENCOUNTER — Encounter: Payer: Self-pay | Admitting: Internal Medicine

## 2019-05-09 ENCOUNTER — Other Ambulatory Visit: Payer: Self-pay | Admitting: Internal Medicine

## 2019-05-09 DIAGNOSIS — E7800 Pure hypercholesterolemia, unspecified: Secondary | ICD-10-CM

## 2019-05-10 ENCOUNTER — Ambulatory Visit: Payer: Medicare Other

## 2019-05-13 DIAGNOSIS — J449 Chronic obstructive pulmonary disease, unspecified: Secondary | ICD-10-CM | POA: Diagnosis not present

## 2019-05-15 NOTE — Telephone Encounter (Signed)
Called [facility] and spoke to billing to see what I needed to do. Eligibility was not found for patient. Specialist did some research and found that pt name has a suffix. Specialist changed the info on the Key. I was able to refresh and there for able to verify patient.   Prior Auth started. Spoke to PCP about one of the questions. PCP stated that patient was given Trelegy samples and pt was to use that instead of the [place].   Called pt to see how the Trelegy was working. Pt stated that he felt the [place] worked better than the Trelegy. Informed pt I would send in the prior auth for the [place].   Will inform PCP of same.

## 2019-05-15 NOTE — Telephone Encounter (Signed)
Called [facility] and informed Medicare Part B is covering the [place]. Specialist ran the claim and stated that Part B is approving the [place].

## 2019-05-15 NOTE — Telephone Encounter (Signed)
New Key: [id number]

## 2019-05-15 NOTE — Telephone Encounter (Signed)
Deniedtoday  This request was denied under your Medicare Part D benefit; however, coverage for the requested drug(s) has been approved under Medicare Part B. [employer] follows Medicare rules. The Medicare rule in Chapter 6 of the Prescription Drug Manual says that drugs covered under the Part B benefit cannot be covered under Part D. Your pharmacy tells [place] where you live when they submit pharmacy claims. Your pharmacy has indicated you are getting this medication at home. The Medicare Benefit Manual (Chapter 15, Section 110.3) says Medicare Part B pays for drugs that require administration by the use of a piece of covered durable medical equipment (DME) such as a nebulizer. [employer] has approved coverage for your drug under your Part B benefit for/through 04/10/2020. If you think Medicare Part D should cover this drug for you, you may appeal.

## 2019-05-15 NOTE — Telephone Encounter (Signed)
Pharmacy is calling about this medication and the PA, Patient is requesting refills on this medication.   Anda Kraft @[facility]  - [phone]

## 2019-05-16 ENCOUNTER — Other Ambulatory Visit: Payer: Self-pay | Admitting: Internal Medicine

## 2019-05-16 DIAGNOSIS — J418 Mixed simple and mucopurulent chronic bronchitis: Secondary | ICD-10-CM

## 2019-05-17 NOTE — Telephone Encounter (Signed)
PA for [place] - Key: [id number]

## 2019-05-18 ENCOUNTER — Ambulatory Visit: Payer: Medicare PPO | Attending: Internal Medicine

## 2019-05-18 DIAGNOSIS — Z23 Encounter for immunization: Secondary | ICD-10-CM | POA: Insufficient documentation

## 2019-05-18 NOTE — Progress Notes (Signed)
   Covid-19 Vaccination Clinic  Name:  Joshua Cross    MRN: [id number] DOB: 1945-06-12  05/18/2019  [name] was observed post Covid-19 immunization for 15 minutes without incidence. He was provided with Vaccine Information Sheet and instruction to access the V-Safe system.   [name] was instructed to call 911 with any severe reactions post vaccine: Marland Kitchen Difficulty breathing  . Swelling of your face and throat  . A fast heartbeat  . A bad rash all over your body  . Dizziness and weakness    Immunizations Administered    Name Date Dose VIS Date Route   Pfizer COVID-19 Vaccine 05/18/2019  5:30 PM 0.3 mL 03/22/2019 Intramuscular   Manufacturer: [place]   Lot: [id number]   [place]: [id number]

## 2019-05-20 MED ORDER — BROVANA 15 MCG/2ML IN NEBU: 15.0000 ug | INHALATION_SOLUTION | Freq: Two times a day (BID) | RESPIRATORY_TRACT | 3 refills | Status: DC

## 2019-05-20 NOTE — Addendum Note (Signed)
Addended by: Karle Barr on: 05/20/2019 09:48 AM   Modules accepted: Orders

## 2019-05-20 NOTE — Telephone Encounter (Signed)
[place] called and stated that the budesonide did not require a PA and that the [place] needed a new rx sent to fill. They stated that they could get 3 months of the [place] for the copay price of 1 month.

## 2019-05-21 ENCOUNTER — Ambulatory Visit: Payer: Medicare Other

## 2019-06-10 DIAGNOSIS — J449 Chronic obstructive pulmonary disease, unspecified: Secondary | ICD-10-CM | POA: Diagnosis not present

## 2019-06-12 ENCOUNTER — Ambulatory Visit: Payer: Medicare PPO | Attending: Internal Medicine

## 2019-06-12 DIAGNOSIS — Z23 Encounter for immunization: Secondary | ICD-10-CM | POA: Insufficient documentation

## 2019-06-12 NOTE — Progress Notes (Signed)
   Covid-19 Vaccination Clinic  Name:  Joshua Cross    MRN: [id number] DOB: 1945-08-09  06/12/2019  Mr. Joshua Cross was observed post Covid-19 immunization for 15 minutes without incident. He was provided with Vaccine Information Sheet and instruction to access the V-Safe system.   Mr. Joshua Cross was instructed to call 911 with any severe reactions post vaccine: Marland Kitchen Difficulty breathing  . Swelling of face and throat  . A fast heartbeat  . A bad rash all over body  . Dizziness and weakness   Immunizations Administered    Name Date Dose VIS Date Route   Pfizer COVID-19 Vaccine 06/12/2019  2:04 PM 0.3 mL 03/22/2019 Intramuscular   Manufacturer: [place]   Lot: [id number]   [place]: [id number]

## 2019-07-11 DIAGNOSIS — J449 Chronic obstructive pulmonary disease, unspecified: Secondary | ICD-10-CM | POA: Diagnosis not present

## 2019-07-16 ENCOUNTER — Encounter: Payer: Self-pay | Admitting: Internal Medicine

## 2019-07-16 DIAGNOSIS — C4441 Basal cell carcinoma of skin of scalp and neck: Secondary | ICD-10-CM | POA: Diagnosis not present

## 2019-07-16 DIAGNOSIS — C44519 Basal cell carcinoma of skin of other part of trunk: Secondary | ICD-10-CM | POA: Diagnosis not present

## 2019-07-16 DIAGNOSIS — D3617 Benign neoplasm of peripheral nerves and autonomic nervous system of trunk, unspecified: Secondary | ICD-10-CM | POA: Diagnosis not present

## 2019-07-16 DIAGNOSIS — D2262 Melanocytic nevi of left upper limb, including shoulder: Secondary | ICD-10-CM | POA: Diagnosis not present

## 2019-07-16 DIAGNOSIS — L821 Other seborrheic keratosis: Secondary | ICD-10-CM | POA: Diagnosis not present

## 2019-07-16 DIAGNOSIS — Z85828 Personal history of other malignant neoplasm of skin: Secondary | ICD-10-CM | POA: Diagnosis not present

## 2019-07-16 DIAGNOSIS — D225 Melanocytic nevi of trunk: Secondary | ICD-10-CM | POA: Diagnosis not present

## 2019-08-10 DIAGNOSIS — J449 Chronic obstructive pulmonary disease, unspecified: Secondary | ICD-10-CM | POA: Diagnosis not present

## 2019-09-07 ENCOUNTER — Other Ambulatory Visit: Payer: Self-pay | Admitting: Internal Medicine

## 2019-09-07 DIAGNOSIS — F3342 Major depressive disorder, recurrent, in full remission: Secondary | ICD-10-CM

## 2019-09-10 DIAGNOSIS — J449 Chronic obstructive pulmonary disease, unspecified: Secondary | ICD-10-CM | POA: Diagnosis not present

## 2019-09-16 ENCOUNTER — Telehealth: Payer: Self-pay | Admitting: Internal Medicine

## 2019-09-16 DIAGNOSIS — J418 Mixed simple and mucopurulent chronic bronchitis: Secondary | ICD-10-CM

## 2019-09-16 NOTE — Telephone Encounter (Signed)
   Anda Kraft from [place] requesting order for [employer] for patient. Patient needs replacement

## 2019-09-17 MED ORDER — LONHALA MAGNAIR STARTER KIT 25 MCG/ML IN SOLN: 1.0000 | Freq: Two times a day (BID) | RESPIRATORY_TRACT | 0 refills | Status: AC

## 2019-09-17 NOTE — Telephone Encounter (Signed)
New Message:   Joshua Cross is calling form [facility] and states the pt nebulizer needs prior authorization. She states she has initiated this with cover my meds and the key is [id number] and it is under [name]. Please advise.

## 2019-09-17 NOTE — Telephone Encounter (Signed)
Erx has been sent as requested  

## 2019-10-10 ENCOUNTER — Other Ambulatory Visit: Payer: Self-pay | Admitting: Internal Medicine

## 2019-10-10 DIAGNOSIS — J418 Mixed simple and mucopurulent chronic bronchitis: Secondary | ICD-10-CM

## 2019-10-10 DIAGNOSIS — J449 Chronic obstructive pulmonary disease, unspecified: Secondary | ICD-10-CM | POA: Diagnosis not present

## 2019-10-20 ENCOUNTER — Other Ambulatory Visit: Payer: Self-pay | Admitting: Internal Medicine

## 2019-10-20 DIAGNOSIS — J418 Mixed simple and mucopurulent chronic bronchitis: Secondary | ICD-10-CM

## 2019-11-07 ENCOUNTER — Other Ambulatory Visit: Payer: Self-pay | Admitting: Internal Medicine

## 2019-11-07 ENCOUNTER — Encounter: Payer: Self-pay | Admitting: Internal Medicine

## 2019-11-07 DIAGNOSIS — E7800 Pure hypercholesterolemia, unspecified: Secondary | ICD-10-CM

## 2019-11-08 ENCOUNTER — Other Ambulatory Visit: Payer: Medicare PPO

## 2019-11-08 ENCOUNTER — Other Ambulatory Visit: Payer: Self-pay

## 2019-11-10 DIAGNOSIS — J449 Chronic obstructive pulmonary disease, unspecified: Secondary | ICD-10-CM | POA: Diagnosis not present

## 2019-11-11 ENCOUNTER — Other Ambulatory Visit: Payer: Self-pay | Admitting: Internal Medicine

## 2019-11-11 DIAGNOSIS — E7800 Pure hypercholesterolemia, unspecified: Secondary | ICD-10-CM

## 2019-11-13 ENCOUNTER — Ambulatory Visit (INDEPENDENT_AMBULATORY_CARE_PROVIDER_SITE_OTHER): Payer: Medicare PPO | Admitting: Internal Medicine

## 2019-11-13 ENCOUNTER — Encounter: Payer: Self-pay | Admitting: Internal Medicine

## 2019-11-13 ENCOUNTER — Other Ambulatory Visit: Payer: Self-pay

## 2019-11-13 VITALS — BP 110/68 | HR 71 | Temp 97.9°F | Ht 67.0 in | Wt 126.4 lb

## 2019-11-13 DIAGNOSIS — N400 Enlarged prostate without lower urinary tract symptoms: Secondary | ICD-10-CM | POA: Diagnosis not present

## 2019-11-13 DIAGNOSIS — E785 Hyperlipidemia, unspecified: Secondary | ICD-10-CM

## 2019-11-13 DIAGNOSIS — E118 Type 2 diabetes mellitus with unspecified complications: Secondary | ICD-10-CM | POA: Diagnosis not present

## 2019-11-13 DIAGNOSIS — Z0000 Encounter for general adult medical examination without abnormal findings: Secondary | ICD-10-CM

## 2019-11-13 NOTE — Patient Instructions (Addendum)

## 2019-11-13 NOTE — Progress Notes (Signed)
Subjective:  Patient ID: Joshua Cross, male    DOB: 1946/01/02  Age: 74 y.o. MRN: [id number]  CC: Annual Exam, COPD, Hyperlipidemia, and Diabetes  This visit occurred during the SARS-CoV-2 public health emergency.  Safety protocols were in place, including screening questions prior to the visit, additional usage of staff PPE, and extensive cleaning of exam room while observing appropriate contact time as indicated for disinfecting solutions.    HPI RAZA BAYLESS presents for a CPX.  He has rare nonproductive cough but no recent exacerbations of his lung disease.  He has a baseline level of wheezing and shortness of breath. He denies hemoptysis, chest pain, dyspnea on exertion, palpitations, edema, or fatigue.  Outpatient Medications Prior to Visit  Medication Sig Dispense Refill  . albuterol (PROVENTIL HFA;VENTOLIN HFA) 108 (90 Base) MCG/ACT inhaler INHALE 2 PUFFS INTO THE LUNGS EVERY 6 HOURS AS NEEDED FOR WHEEZING OR SHORTNESS OF BREATH 18 g 3  . arformoterol (BROVANA) 15 MCG/2ML NEBU Take 2 mLs (15 mcg total) by nebulization 2 (two) times daily. 360 mL 3  . budesonide (PULMICORT) 0.5 MG/2ML nebulizer solution INHALE CONTENTS OF 1 VIAL VIA NEBULIZER TWICE DAILY 120 mL 11  . cholecalciferol (VITAMIN D) 1000 units tablet Take 2,000 Units by mouth daily.    . CHOLINE PO Take 2 tablets by mouth daily.    Marland Kitchen DALIRESP 500 MCG TABS tablet TAKE 1 TABLET BY MOUTH DAILY 90 tablet 1  . ipratropium (ATROVENT) 0.03 % nasal spray INSTILL 2 SPRAYS INTO EACH NOSTRIL EVERY 12 HOURS 30 mL 5  . LONHALA MAGNAIR STARTER KIT 25 MCG/ML SOLN INHALE 1 VIAL(1ML) USING MAGNAIR NEBULIZER DEVICE TWICE DAILY AS DIRECTED 60 mL 11  . LUMIGAN 0.01 % SOLN Instill 1 drop into both eyes once daily at bedtime  0  . magnesium gluconate (MAGONATE) 500 MG tablet Take 500 mg by mouth daily.    . Melatonin 10 MG CAPS Take 10 mg by mouth daily.     . Omega-3 Fatty Acids (FISH OIL) 1200 MG CAPS Take 1,200 mg by mouth 2  (two) times daily.     Marland Kitchen PARoxetine (PAXIL) 20 MG tablet TAKE 1 TABLET(20 MG) BY MOUTH DAILY 90 tablet 1  . Potassium 99 MG TABS Take 1 tablet by mouth daily.     . Probiotic Product (PROBIOTIC ADVANCED PO) Take 1 capsule by mouth daily. From deep roots     . rosuvastatin (CRESTOR) 10 MG tablet TAKE 1 TABLET(10 MG) BY MOUTH DAILY 90 tablet 1  . TURMERIC PO Take 1 tablet by mouth daily.      No facility-administered medications prior to visit.    ROS Review of Systems  Constitutional: Negative for appetite change, chills, diaphoresis, fatigue and fever.  HENT: Negative.  Negative for trouble swallowing.   Eyes: Negative.   Respiratory: Positive for cough, shortness of breath and wheezing. Negative for choking, chest tightness and stridor.   Cardiovascular: Negative for chest pain, palpitations and leg swelling.  Gastrointestinal: Negative for abdominal pain, blood in stool, constipation, diarrhea, nausea and vomiting.  Endocrine: Negative.   Genitourinary: Negative.  Negative for difficulty urinating and dysuria.  Musculoskeletal: Negative.  Negative for arthralgias.  Skin: Negative.  Negative for color change.  Neurological: Negative for dizziness, weakness, light-headedness and headaches.  Hematological: Negative for adenopathy. Does not bruise/bleed easily.  Psychiatric/Behavioral: Negative.     Objective:  BP 110/68 (BP Location: Left Arm, Patient Position: Sitting, Cuff Size: Normal)   Pulse 71  Temp 97.9 F (36.6 C) (Oral)   Ht '5\' 7"'$  (1.702 m)   Wt 126 lb 6.4 oz (57.3 kg)   SpO2 93%   BMI 19.80 kg/m   BP Readings from Last 3 Encounters:  11/13/19 110/68  12/24/18 118/80  12/13/18 138/60    Wt Readings from Last 3 Encounters:  11/13/19 126 lb 6.4 oz (57.3 kg)  12/24/18 126 lb (57.2 kg)  12/13/18 125 lb 4 oz (56.8 kg)    Physical Exam Vitals reviewed.  Constitutional:      General: He is not in acute distress.    Appearance: Normal appearance. He is not  ill-appearing, toxic-appearing or diaphoretic.  HENT:     Nose: Nose normal.     Mouth/Throat:     Mouth: Mucous membranes are moist.  Eyes:     General: No scleral icterus.    Conjunctiva/sclera: Conjunctivae normal.  Cardiovascular:     Rate and Rhythm: Normal rate and regular rhythm.     Heart sounds: No murmur heard.   Pulmonary:     Effort: Pulmonary effort is normal.     Breath sounds: No stridor. Examination of the right-upper field reveals decreased breath sounds. Examination of the left-upper field reveals decreased breath sounds. Examination of the right-middle field reveals decreased breath sounds. Examination of the left-middle field reveals decreased breath sounds. Examination of the right-lower field reveals decreased breath sounds. Examination of the left-lower field reveals decreased breath sounds. Decreased breath sounds present. No wheezing, rhonchi or rales.  Abdominal:     General: Abdomen is flat.     Palpations: There is no mass.     Tenderness: There is no abdominal tenderness. There is no guarding.  Musculoskeletal:        General: Normal range of motion.     Cervical back: Neck supple.     Right lower leg: No edema.     Left lower leg: No edema.  Lymphadenopathy:     Cervical: No cervical adenopathy.  Skin:    General: Skin is warm and dry.     Coloration: Skin is not pale.  Neurological:     General: No focal deficit present.     Mental Status: He is alert.     Lab Results  Component Value Date   WBC 8.1 11/13/2019   HGB 15.0 11/13/2019   HCT 44.4 11/13/2019   PLT 347 11/13/2019   GLUCOSE 98 11/13/2019   CHOL 178 11/13/2019   TRIG 89 11/13/2019   HDL 85 11/13/2019   LDLCALC 76 11/13/2019   ALT 24 11/13/2019   AST 23 11/13/2019   NA 138 11/13/2019   K 4.4 11/13/2019   CL 103 11/13/2019   CREATININE 0.84 11/13/2019   BUN 12 11/13/2019   CO2 28 11/13/2019   TSH 2.89 11/13/2019   PSA 0.6 11/13/2019   HGBA1C 5.9 (H) 11/13/2019   MICROALBUR  0.2 11/13/2019    DG Chest 2 View  Result Date: 05/15/2018 CLINICAL DATA:  Chronic dyspnea; worsening x 2 mo. Hx of COPD, emphysema, right lung sx. Ex smoker. EXAM: CHEST - 2 VIEW COMPARISON:  04/30/2017 FINDINGS: Lungs are hyperinflated. Marked emphysematous changes. There is perihilar peribronchial thickening, stable and chronic. There are no new consolidations or pleural effusions. No pulmonary edema. Surgical clips are identified in the MEDIAL aspect of the RIGHT lung apex. Remote rib fractures. IMPRESSION: Chronic, marked hyperinflation/emphysematous changes of the lungs. The superimposed infectious process. Electronically Signed   By: Nolon Nations M.D.  On: 05/15/2018 13:54    Assessment & Plan:   Feliz was seen today for annual exam, copd, hyperlipidemia and diabetes.  Diagnoses and all orders for this visit:  Type II diabetes mellitus with manifestations (HCC)-his blood sugar is adequately well controlled. -     CBC with Differential/Platelet; Future -     BASIC METABOLIC PANEL WITH GFR; Future -     Hemoglobin A1c; Future -     Microalbumin / creatinine urine ratio; Future -     Microalbumin / creatinine urine ratio -     Hemoglobin A1c -     BASIC METABOLIC PANEL WITH GFR -     CBC with Differential/Platelet -     HM Diabetes Foot Exam  Hyperlipidemia with target LDL less than 130-he has achieved his LDL goal and is doing well on the statin. -     Lipid panel; Future -     Hepatic function panel; Future -     TSH; Future -     TSH -     Hepatic function panel -     Lipid panel  Routine general medical examination at a health care facility-exam completed, labs reviewed, vaccines reviewed and updated, cancer screenings are up-to-date, patient education was given  Benign prostatic hyperplasia without lower urinary tract symptoms-his PSA is low which is a reassuring sign that he does not have prostate cancer.  He has no symptoms that need to be treated. -     PSA;  Future -     Urinalysis, Routine w reflex microscopic; Future -     Urinalysis, Routine w reflex microscopic -     PSA   I am having [name] "Jim" maintain his [place], Probiotic Product (PROBIOTIC ADVANCED PO), magnesium gluconate, Potassium, TURMERIC PO, Fish Oil, Melatonin, cholecalciferol, CHOLINE PO, ipratropium, albuterol, budesonide, Brovana, PARoxetine, Daliresp, [employer], and rosuvastatin.  No orders of the defined types were placed in this encounter.    Follow-up: Return in about 1 year (around 11/12/2020).  Scarlette Calico, MD

## 2019-11-14 LAB — HEMOGLOBIN A1C
Hgb A1c MFr Bld: 5.9 % of total Hgb — ABNORMAL HIGH (ref <5.7)
Mean Plasma Glucose: 123 (calc)
eAG (mmol/L): 6.8 (calc)

## 2019-11-14 LAB — CBC WITH DIFFERENTIAL/PLATELET
Absolute Monocytes: 502 cells/uL (ref 200–950)
Basophils Absolute: 32 cells/uL (ref 0–200)
Basophils Relative: 0.4 %
Eosinophils Absolute: 170 cells/uL (ref 15–500)
Eosinophils Relative: 2.1 %
HCT: 44.4 % (ref 38.5–50.0)
Hemoglobin: 15 g/dL (ref 13.2–17.1)
Lymphs Abs: 2892 cells/uL (ref 850–3900)
MCH: 31.2 pg (ref 27.0–33.0)
MCHC: 33.8 g/dL (ref 32.0–36.0)
MCV: 92.3 fL (ref 80.0–100.0)
MPV: 9.9 fL (ref 7.5–12.5)
Monocytes Relative: 6.2 %
Neutro Abs: 4504 cells/uL (ref 1500–7800)
Neutrophils Relative %: 55.6 %
Platelets: 347 10*3/uL (ref 140–400)
RBC: 4.81 10*6/uL (ref 4.20–5.80)
RDW: 12.4 % (ref 11.0–15.0)
Total Lymphocyte: 35.7 %
WBC: 8.1 10*3/uL (ref 3.8–10.8)

## 2019-11-14 LAB — HEPATIC FUNCTION PANEL
AG Ratio: 1.8 (calc) (ref 1.0–2.5)
ALT: 24 U/L (ref 9–46)
AST: 23 U/L (ref 10–35)
Albumin: 4.5 g/dL (ref 3.6–5.1)
Alkaline phosphatase (APISO): 82 U/L (ref 35–144)
Bilirubin, Direct: 0.1 mg/dL (ref 0.0–0.2)
Globulin: 2.5 g/dL (calc) (ref 1.9–3.7)
Indirect Bilirubin: 0.4 mg/dL (calc) (ref 0.2–1.2)
Total Bilirubin: 0.5 mg/dL (ref 0.2–1.2)
Total Protein: 7 g/dL (ref 6.1–8.1)

## 2019-11-14 LAB — MICROALBUMIN / CREATININE URINE RATIO
Creatinine, Urine: 32 mg/dL (ref 20–320)
Microalb Creat Ratio: 6 mcg/mg creat (ref <30)
Microalb, Ur: 0.2 mg/dL

## 2019-11-14 LAB — PSA: PSA: 0.6 ng/mL (ref <=4.0)

## 2019-11-14 LAB — BASIC METABOLIC PANEL WITH GFR
BUN: 12 mg/dL (ref 7–25)
CO2: 28 mmol/L (ref 20–32)
Calcium: 9.8 mg/dL (ref 8.6–10.3)
Chloride: 103 mmol/L (ref 98–110)
Creat: 0.84 mg/dL (ref 0.70–1.18)
GFR, Est African American: 100 mL/min/{1.73_m2} (ref >=60)
GFR, Est Non African American: 86 mL/min/{1.73_m2} (ref >=60)
Glucose, Bld: 98 mg/dL (ref 65–99)
Potassium: 4.4 mmol/L (ref 3.5–5.3)
Sodium: 138 mmol/L (ref 135–146)

## 2019-11-14 LAB — LIPID PANEL
Cholesterol: 178 mg/dL (ref <200)
HDL: 85 mg/dL (ref >=40)
LDL Cholesterol (Calc): 76 mg/dL (calc)
Non-HDL Cholesterol (Calc): 93 mg/dL (calc) (ref <130)
Total CHOL/HDL Ratio: 2.1 (calc) (ref <5.0)
Triglycerides: 89 mg/dL (ref <150)

## 2019-11-14 LAB — URINALYSIS, ROUTINE W REFLEX MICROSCOPIC
Bilirubin Urine: NEGATIVE
Glucose, UA: NEGATIVE
Hgb urine dipstick: NEGATIVE
Ketones, ur: NEGATIVE
Leukocytes,Ua: NEGATIVE
Nitrite: NEGATIVE
Protein, ur: NEGATIVE
Specific Gravity, Urine: 1.008 (ref 1.001–1.03)
pH: 6 (ref 5.0–8.0)

## 2019-11-14 LAB — TSH: TSH: 2.89 mIU/L (ref 0.40–4.50)

## 2019-11-15 ENCOUNTER — Encounter: Payer: Self-pay | Admitting: Internal Medicine

## 2019-11-25 ENCOUNTER — Ambulatory Visit (INDEPENDENT_AMBULATORY_CARE_PROVIDER_SITE_OTHER): Payer: Medicare PPO

## 2019-11-25 ENCOUNTER — Other Ambulatory Visit: Payer: Self-pay

## 2019-11-25 VITALS — BP 122/70 | HR 66 | Temp 98.3°F | Resp 16 | Ht 67.0 in | Wt 128.6 lb

## 2019-11-25 DIAGNOSIS — Z0000 Encounter for general adult medical examination without abnormal findings: Secondary | ICD-10-CM

## 2019-11-25 NOTE — Patient Instructions (Signed)
Joshua Cross , Thank you for taking time to come for your Medicare Wellness Visit. I appreciate your ongoing commitment to your health goals. Please review the following plan we discussed and let me know if I can assist you in the future.   Screening recommendations/referrals: Colonoscopy: 01/18/2017; due every 3 years Recommended yearly ophthalmology/optometry visit for glaucoma screening and checkup Recommended yearly dental visit for hygiene and checkup  Vaccinations: Influenza vaccine: 12/08/2018 Pneumococcal vaccine: completed Tdap vaccine: 05/27/2015; due every 10 years Shingles vaccine:completed   Covid-19: [employer] completed  Advanced directives: documents on file.  Conditions/risks identified: Yes; Please continue to do your personal lifestyle choices by: daily care of teeth and gums, regular physical activity (goal should be 5 days a week for 30 minutes), eat a healthy diet, avoid tobacco and drug use, limiting any alcohol intake, taking a low-dose aspirin (if not allergic or have been advised by your provider otherwise) and taking vitamins and minerals as recommended by your provider. Continue doing brain stimulating activities (puzzles, reading, adult coloring books, staying active) to keep memory sharp. Continue to eat heart healthy diet (full of fruits, vegetables, whole grains, lean protein, water--limit salt, fat, and sugar intake) and increase physical activity as tolerated.  Next appointment: Please schedule your next Medicare Wellness Visit with your Nurse Health Advisor in 1 year.  Preventive Care 27 Years and Older, Male Preventive care refers to lifestyle choices and visits with your health care provider that can promote health and wellness. What does preventive care include?  A yearly physical exam. This is also called an annual well check.  Dental exams once or twice a year.  Routine eye exams. Ask your health care provider how often you should have your eyes  checked.  Personal lifestyle choices, including:  Daily care of your teeth and gums.  Regular physical activity.  Eating a healthy diet.  Avoiding tobacco and drug use.  Limiting alcohol use.  Practicing safe sex.  Taking low doses of aspirin every day.  Taking vitamin and mineral supplements as recommended by your health care provider. What happens during an annual well check? The services and screenings done by your health care provider during your annual well check will depend on your age, overall health, lifestyle risk factors, and family history of disease. Counseling  Your health care provider may ask you questions about your:  Alcohol use.  Tobacco use.  Drug use.  Emotional well-being.  Home and relationship well-being.  Sexual activity.  Eating habits.  History of falls.  Memory and ability to understand (cognition).  Work and work Statistician. Screening  You may have the following tests or measurements:  Height, weight, and BMI.  Blood pressure.  Lipid and cholesterol levels. These may be checked every 5 years, or more frequently if you are over 96 years old.  Skin check.  Lung cancer screening. You may have this screening every year starting at age 81 if you have a 30-pack-year history of smoking and currently smoke or have quit within the past 15 years.  Fecal occult blood test (FOBT) of the stool. You may have this test every year starting at age 22.  Flexible sigmoidoscopy or colonoscopy. You may have a sigmoidoscopy every 5 years or a colonoscopy every 10 years starting at age 24.  Prostate cancer screening. Recommendations will vary depending on your family history and other risks.  Hepatitis C blood test.  Hepatitis B blood test.  Sexually transmitted disease (STD) testing.  Diabetes screening. This is  done by checking your blood sugar (glucose) after you have not eaten for a while (fasting). You may have this done every 1-3  years.  Abdominal aortic aneurysm (AAA) screening. You may need this if you are a current or former smoker.  Osteoporosis. You may be screened starting at age 34 if you are at high risk. Talk with your health care provider about your test results, treatment options, and if necessary, the need for more tests. Vaccines  Your health care provider may recommend certain vaccines, such as:  Influenza vaccine. This is recommended every year.  Tetanus, diphtheria, and acellular pertussis (Tdap, Td) vaccine. You may need a Td booster every 10 years.  Zoster vaccine. You may need this after age 65.  Pneumococcal 13-valent conjugate (PCV13) vaccine. One dose is recommended after age 92.  Pneumococcal polysaccharide (PPSV23) vaccine. One dose is recommended after age 61. Talk to your health care provider about which screenings and vaccines you need and how often you need them. This information is not intended to replace advice given to you by your health care provider. Make sure you discuss any questions you have with your health care provider. Document Released: 04/24/2015 Document Revised: 12/16/2015 Document Reviewed: 01/27/2015 Elsevier Interactive Patient Education  2017 [place] Prevention in the Home Falls can cause injuries. They can happen to people of all ages. There are many things you can do to make your home safe and to help prevent falls. What can I do on the outside of my home?  Regularly fix the edges of walkways and driveways and fix any cracks.  Remove anything that might make you trip as you walk through a door, such as a raised step or threshold.  Trim any bushes or trees on the path to your home.  Use bright outdoor lighting.  Clear any walking paths of anything that might make someone trip, such as rocks or tools.  Regularly check to see if handrails are loose or broken. Make sure that both sides of any steps have handrails.  Any raised decks and porches  should have guardrails on the edges.  Have any leaves, snow, or ice cleared regularly.  Use sand or salt on walking paths during winter.  Clean up any spills in your garage right away. This includes oil or grease spills. What can I do in the bathroom?  Use night lights.  Install grab bars by the toilet and in the tub and shower. Do not use towel bars as grab bars.  Use non-skid mats or decals in the tub or shower.  If you need to sit down in the shower, use a plastic, non-slip stool.  Keep the floor dry. Clean up any water that spills on the floor as soon as it happens.  Remove soap buildup in the tub or shower regularly.  Attach bath mats securely with double-sided non-slip rug tape.  Do not have throw rugs and other things on the floor that can make you trip. What can I do in the bedroom?  Use night lights.  Make sure that you have a light by your bed that is easy to reach.  Do not use any sheets or blankets that are too big for your bed. They should not hang down onto the floor.  Have a firm chair that has side arms. You can use this for support while you get dressed.  Do not have throw rugs and other things on the floor that can make you trip. What  can I do in the kitchen?  Clean up any spills right away.  Avoid walking on wet floors.  Keep items that you use a lot in easy-to-reach places.  If you need to reach something above you, use a strong step stool that has a grab bar.  Keep electrical cords out of the way.  Do not use floor polish or wax that makes floors slippery. If you must use wax, use non-skid floor wax.  Do not have throw rugs and other things on the floor that can make you trip. What can I do with my stairs?  Do not leave any items on the stairs.  Make sure that there are handrails on both sides of the stairs and use them. Fix handrails that are broken or loose. Make sure that handrails are as long as the stairways.  Check any carpeting to  make sure that it is firmly attached to the stairs. Fix any carpet that is loose or worn.  Avoid having throw rugs at the top or bottom of the stairs. If you do have throw rugs, attach them to the floor with carpet tape.  Make sure that you have a light switch at the top of the stairs and the bottom of the stairs. If you do not have them, ask someone to add them for you. What else can I do to help prevent falls?  Wear shoes that:  Do not have high heels.  Have rubber bottoms.  Are comfortable and fit you well.  Are closed at the toe. Do not wear sandals.  If you use a stepladder:  Make sure that it is fully opened. Do not climb a closed stepladder.  Make sure that both sides of the stepladder are locked into place.  Ask someone to hold it for you, if possible.  Clearly mark and make sure that you can see:  Any grab bars or handrails.  First and last steps.  Where the edge of each step is.  Use tools that help you move around (mobility aids) if they are needed. These include:  Canes.  Walkers.  Scooters.  Crutches.  Turn on the lights when you go into a dark area. Replace any light bulbs as soon as they burn out.  Set up your furniture so you have a clear path. Avoid moving your furniture around.  If any of your floors are uneven, fix them.  If there are any pets around you, be aware of where they are.  Review your medicines with your doctor. Some medicines can make you feel dizzy. This can increase your chance of falling. Ask your doctor what other things that you can do to help prevent falls. This information is not intended to replace advice given to you by your health care provider. Make sure you discuss any questions you have with your health care provider. Document Released: 01/22/2009 Document Revised: 09/03/2015 Document Reviewed: 05/02/2014 Elsevier Interactive Patient Education  2017 [employer].

## 2019-11-25 NOTE — Progress Notes (Signed)
Subjective:   Joshua Cross is a 74 y.o. male who presents for Medicare Annual/Subsequent preventive examination.  Review of Systems    No ROS. Medicare Wellness Visit Cardiac Risk Factors include: advanced age (>37mn, >>67women);diabetes mellitus;dyslipidemia;family history of premature cardiovascular disease;hypertension;male gender     Objective:    Today's Vitals   11/25/19 1335  BP: 122/70  Pulse: 66  Resp: 16  Temp: 98.3 F (36.8 C)  SpO2: 91%  Weight: 128 lb 9.6 oz (58.3 kg)  Height: _0  (1.702 m)  PainSc: 0-No pain   Body mass index is 20.14 kg/m.  Advanced Directives 11/25/2019 11/21/2018 11/15/2017 04/30/2017 12/07/2016 11/24/2016  Does Patient Have a Medical Advance Directive? Yes Yes No No No No  Type of AParamedicof [place]om City[place] will [place] will - - - -  Copy of [place] Chart? Yes - validated most recent copy scanned in chart (See row information) No - copy requested - - - -  Would patient like information on creating a medical advance directive? - - Yes (ED - Information included in AVS) - No - Patient declined -    Current Medications (verified) Outpatient Encounter Medications as of 11/25/2019  Medication Sig   albuterol (PROVENTIL HFA;VENTOLIN HFA) 108 (90 Base) MCG/ACT inhaler INHALE 2 PUFFS INTO THE LUNGS EVERY 6 HOURS AS NEEDED FOR WHEEZING OR SHORTNESS OF BREATH   arformoterol (BROVANA) 15 MCG/2ML NEBU Take 2 mLs (15 mcg total) by nebulization 2 (two) times daily.   budesonide (PULMICORT) 0.5 MG/2ML nebulizer solution INHALE CONTENTS OF 1 VIAL VIA NEBULIZER TWICE DAILY   cholecalciferol (VITAMIN D) 1000 units tablet Take 2,000 Units by mouth daily.   CHOLINE PO Take 2 tablets by mouth daily.   DALIRESP 500 MCG TABS tablet TAKE 1 TABLET BY MOUTH DAILY   ipratropium (ATROVENT) 0.03 % nasal spray INSTILL 2 SPRAYS INTO EACH NOSTRIL EVERY 12 HOURS   LONHALA MAGNAIR  STARTER KIT 25 MCG/ML SOLN INHALE 1 VIAL(1ML) USING MAGNAIR NEBULIZER DEVICE TWICE DAILY AS DIRECTED   magnesium gluconate (MAGONATE) 500 MG tablet Take 500 mg by mouth daily.   Melatonin 10 MG CAPS Take 10 mg by mouth daily.    Omega-3 Fatty Acids (FISH OIL) 1200 MG CAPS Take 1,200 mg by mouth 2 (two) times daily.    PARoxetine (PAXIL) 20 MG tablet TAKE 1 TABLET(20 MG) BY MOUTH DAILY   Potassium 99 MG TABS Take 1 tablet by mouth daily.    Probiotic Product (PROBIOTIC ADVANCED PO) Take 1 capsule by mouth daily. From deep roots    rosuvastatin (CRESTOR) 10 MG tablet TAKE 1 TABLET(10 MG) BY MOUTH DAILY   TURMERIC PO Take 1 tablet by mouth daily.    [DISCONTINUED] LONHALA MAGNAIR REFILL KIT 25 MCG/ML SOLN INHALE 1 DOSE INTO THE LUNGS TWICE DAILY   [DISCONTINUED] PARoxetine (PAXIL) 20 MG tablet Take 1 tablet (20 mg total) by mouth daily.   [DISCONTINUED] rosuvastatin (CRESTOR) 10 MG tablet Take 1 tablet (10 mg total) by mouth daily.   No facility-administered encounter medications on file as of 11/25/2019.    Allergies (verified) Erythromycin and Penicillins   History: Past Medical History:  Diagnosis Date   Cataract    COPD (chronic obstructive pulmonary disease) ([place]    Hyperlipidemia    Past Surgical History:  Procedure Laterality Date   cataracts Bilateral 2019   HERNIA REPAIR     LUNG REMOVAL, PARTIAL Right 1985   TONSILLECTOMY  Family History  Problem Relation Age of Onset   Alcohol abuse Mother    Stroke Father    Depression Sister    Heart disease Sister    Cancer Neg Hx    COPD Neg Hx    Early death Neg Hx    Kidney disease Neg Hx    Hypertension Neg Hx    Learning disabilities Neg Hx    Social History   Socioeconomic History   Marital status: Divorced    Spouse name: Not on file   Number of children: Not on file   Years of education: Not on file   Highest education level: Not on file  Occupational History   Occupation:  retired  Tobacco Use   Smoking status: Former Smoker    Packs/day: 0.25    Years: 50.00    Pack years: 12.50    Types: Cigarettes    Quit date: 08/06/2004    Years since quitting: 15.3   Smokeless tobacco: Never Used  Vaping Use   Vaping Use: Never used  Substance and Sexual Activity   Alcohol use: No   Drug use: No   Sexual activity: Not Currently  Other Topics Concern   Not on file  Social History Narrative   Patient is from [place], moved to [place] to attend school at [employer], then started work [employer] studies at college in [place]. 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 He is an avid reader and active in [place].   Social Determinants of Health   Financial Resource Strain: Low Risk    Difficulty of Paying Living Expenses: Not hard at all  Food Insecurity: No Food Insecurity   Worried About Charity fundraiser in the Last Year: Never true   [place] in the Last Year: Never true  Transportation Needs: No Transportation Needs   Lack of Transportation (Medical): No   Lack of Transportation (Non-Medical): No  Physical Activity: Inactive   Days of Exercise per Week: 0 days   Minutes of Exercise per Session: 0 min  Stress: No Stress Concern Present   Feeling of Stress : Not at all  Social Connections: Moderately Integrated   Frequency of Communication with Friends and Family: More than three times a week   Frequency of Social Gatherings with Friends and Family: More than three times a week   Attends Religious Services: More than 4 times per year   Active Member of [employer] or Organizations: Yes   Attends Music therapist: More than 4 times per year   Marital Status: Never married    Tobacco Counseling Counseling given: No   Clinical Intake:  Pre-visit preparation completed: Yes  Pain : No/denies pain Pain Score: 0-No pain     BMI - recorded: 20.14 Nutritional Status: BMI of 19-24   Normal Nutritional Risks: None Diabetes: Yes CBG done?: No Did pt. bring in CBG monitor from home?: No  How often do you need to have someone help you when you read instructions, pamphlets, or other written materials from your doctor or pharmacy?: 1 - Never What is the last grade level you completed in school?: Ph. D  Diabetic? yes  Interpreter Needed?: No  Information entered by :: Valyncia Wiens N. Samuell Knoble, LPN   Activities of Daily Living In your present state of health, do you have any difficulty performing the following activities: 11/25/2019  Hearing? N  Vision? N  Difficulty concentrating or making decisions? N  Walking or climbing stairs? N  Dressing or bathing? N  Doing errands, shopping? N  Preparing Food and eating ? N  Using the Toilet? N  In the past six months, have you accidently leaked urine? N  Do you have problems with loss of bowel control? N  Managing your Medications? N  Managing your Finances? N  Housekeeping or managing your Housekeeping? N  Some recent data might be hidden    Patient Care Team: Janith Lima, MD as PCP - General (Internal Medicine) Irene Shipper, MD as Consulting Physician (Gastroenterology)  Indicate any recent Medical Services you may have received from other than [facility] providers in the past year (date may be approximate).     Assessment:   This is a routine wellness examination for Trayvon.  Hearing/Vision screen No exam data present  Dietary issues and exercise activities discussed: Current Exercise Habits: The patient does not participate in regular exercise at present, Exercise limited by: respiratory conditions(s)  Goals      Patient Stated      Continue to be socially engaged with people, enjoy life, family and friends. Enjoy staying updated by reading my papers.        Patient Stated (pt-stated)      My goal is to keep my weight and glucose within normal range and be more active.      Depression Screen PHQ 2/9  Scores 11/25/2019 11/21/2018 11/15/2017  PHQ - 2 Score 0 0 0  PHQ- 9 Score - - 0    Fall Risk Fall Risk  11/25/2019 11/21/2018 11/15/2017  Falls in the past year? 0 0 No  Number falls in past yr: 0 0 -  Injury with Fall? 0 0 -  Risk for fall due to : No Fall Risks History of fall(s) -  Follow up Falls evaluation completed;Education provided - -    Any stairs in or around the home? No  If so, are there any without handrails? No  Home free of loose throw rugs in walkways, pet beds, electrical cords, etc? Yes  Adequate lighting in your home to reduce risk of falls? Yes   ASSISTIVE DEVICES UTILIZED TO PREVENT FALLS:  Life alert? No  Use of a cane, walker or w/c? No  Grab bars in the bathroom? No  Shower chair or bench in shower? No  Elevated toilet seat or a handicapped toilet? No   TIMED UP AND GO:  Was the test performed? No .  Length of time to ambulate 10 feet: 0 sec.   Gait steady and fast without use of assistive device  Cognitive Function:     6CIT Screen 11/25/2019  What Year? 0 points  What month? 0 points  What time? 0 points  Count back from 20 0 points  Months in reverse 0 points  Repeat phrase 0 points  Total Score 0    Immunizations Immunization History  Administered Date(s) Administered   DTaP 03/07/2016   Influenza Split 02/01/2014, 12/31/2014, 01/24/2018   Influenza, Seasonal, Injecte, Preservative Fre 02/13/2010   Influenza-Unspecified 12/15/2016, 02/04/2018, 12/08/2018   PFIZER SARS-COV-2 Vaccination 05/18/2019, 06/12/2019   Pneumococcal Conjugate-13 07/27/2013   Pneumococcal Polysaccharide-23 02/12/2010   Pneumococcal-Unspecified 03/08/2015   Tdap 05/27/2015   Zoster 03/08/2011    TDAP status: Up to date Flu Vaccine status: Up to date Pneumococcal vaccine status: Up to date Covid-19 vaccine status: Completed vaccines  Qualifies for Shingles Vaccine? Yes   Zostavax completed Yes   Shingrix Completed?: Yes  Screening  Tests Health  Maintenance  Topic Date Due   OPHTHALMOLOGY EXAM  04/19/2019   INFLUENZA VACCINE  11/10/2019   COLONOSCOPY  01/19/2020   HEMOGLOBIN A1C  05/15/2020   URINE MICROALBUMIN  11/12/2020   FOOT EXAM  11/13/2020   TETANUS/TDAP  05/26/2025   COVID-19 Vaccine  Completed   Hepatitis C Screening  Completed   PNA vac Low Risk Adult  Completed    Health Maintenance  Health Maintenance Due  Topic Date Due   OPHTHALMOLOGY EXAM  04/19/2019   INFLUENZA VACCINE  11/10/2019    Colorectal cancer screening: Completed 01/18/2017. Repeat every 3 years  Lung Cancer Screening: (Low Dose CT Chest recommended if Age 63-80 years, 30 pack-year currently smoking OR have quit w/in 15years.) does not qualify.   Lung Cancer Screening Referral: no  Additional Screening:  Hepatitis C Screening: does qualify; Completed yes  Vision Screening: Recommended annual ophthalmology exams for early detection of glaucoma and other disorders of the eye. Is the patient up to date with their annual eye exam?  Yes  Who is the provider or what is the name of the office in which the patient attends annual eye exams? Melanee Spry, MD If pt is not established with a provider, would they like to be referred to a provider to establish care? No .   Dental Screening: Recommended annual dental exams for proper oral hygiene  Community Resource Referral / Chronic Care Management: CRR required this visit?  No   CCM required this visit?  No      Plan:     I have personally reviewed and noted the following in the patients chart:    Medical and social history  Use of alcohol, tobacco or illicit drugs   Current medications and supplements  Functional ability and status  Nutritional status  Physical activity  Advanced directives  List of other physicians  Hospitalizations, surgeries, and ER visits in previous 12 months  Vitals  Screenings to include cognitive, depression, and falls  Referrals  and appointments  In addition, I have reviewed and discussed with patient certain preventive protocols, quality metrics, and best practice recommendations. A written personalized care plan for preventive services as well as general preventive health recommendations were provided to patient.     Sheral Flow, LPN   [id number]   Nurse Notes: n/a

## 2019-12-02 ENCOUNTER — Encounter: Payer: Self-pay | Admitting: Internal Medicine

## 2019-12-03 ENCOUNTER — Other Ambulatory Visit: Payer: Self-pay | Admitting: Internal Medicine

## 2019-12-03 DIAGNOSIS — M436 Torticollis: Secondary | ICD-10-CM | POA: Insufficient documentation

## 2019-12-03 MED ORDER — ETODOLAC ER 400 MG PO TB24: 400.0000 mg | ORAL_TABLET | Freq: Every day | ORAL | 0 refills | Status: DC

## 2019-12-03 MED ORDER — TIZANIDINE HCL 2 MG PO CAPS: 2.0000 mg | ORAL_CAPSULE | Freq: Three times a day (TID) | ORAL | 0 refills | Status: DC | PRN

## 2019-12-05 ENCOUNTER — Other Ambulatory Visit: Payer: Self-pay | Admitting: Internal Medicine

## 2019-12-05 DIAGNOSIS — M436 Torticollis: Secondary | ICD-10-CM

## 2019-12-05 MED ORDER — TIZANIDINE HCL 2 MG PO TABS: 2.0000 mg | ORAL_TABLET | Freq: Three times a day (TID) | ORAL | 0 refills | Status: DC | PRN

## 2019-12-05 NOTE — Telephone Encounter (Signed)
Error

## 2019-12-09 ENCOUNTER — Ambulatory Visit: Payer: Medicare PPO | Admitting: Family Medicine

## 2019-12-10 ENCOUNTER — Encounter: Payer: Self-pay | Admitting: Family Medicine

## 2019-12-10 ENCOUNTER — Ambulatory Visit (INDEPENDENT_AMBULATORY_CARE_PROVIDER_SITE_OTHER): Payer: Medicare PPO | Admitting: Family Medicine

## 2019-12-10 ENCOUNTER — Other Ambulatory Visit: Payer: Self-pay

## 2019-12-10 VITALS — BP 90/62 | HR 76 | Ht 67.0 in | Wt 124.2 lb

## 2019-12-10 DIAGNOSIS — M62838 Other muscle spasm: Secondary | ICD-10-CM | POA: Diagnosis not present

## 2019-12-10 NOTE — Progress Notes (Signed)
    Subjective:    CC: Neck pain  I, Wendy Poet, LAT, ATC, am serving as scribe for Dr. Lynne Leader.  HPI: Pt is a 74 y/o male presenting w/ c/o neck pain after "pulling something in his neck" last week.  There was no specific MOI.  He locates his pain to the L side of his neck.  No radiating pain weakness or numbness distally.  Benefit with Zanaflex prescribed by PCP.  Radiating pain: originally yes into the L scapula but this is no longer an issue UE numbness/tingling: No Aggravating factors: L cervical rotation and cervical ext; trying to lie down in bed;  Treatments tried: muscle relaxer (Zanaflex); Advil; IcyHot  Pertinent review of Systems: No fevers or chills  Relevant historical information: Emphysema.  Received his third Covid vaccine this month.   Objective:    Vitals:   12/10/19 1310  BP: 90/62  Pulse: 76  SpO2: 94%   General: Well Developed, well nourished, and in no acute distress.   MSK: C-spine normal-appearing Nontender midline. Tender palpation left cervical paraspinal musculature and trapezius. Decreased cervical motion. Upper extremity strength reflexes and sensation are equal normal throughout.     Impression and Recommendations:    Assessment and Plan: 74 y.o. male with left-sided neck pain ongoing for a few weeks.  Pain consistent with cervical muscle spasm and pain and dysfunction.  He already is improving with a bit of watchful waiting and Zanaflex.  Reasonable to continue Zanaflex and add heating pad and TENS unit.  We will go ahead and refer to physical therapy now.  Recommend scheduling in about 2 weeks and if better cancel if not better continue physical therapy.  Recheck back with me as needed..   Orders Placed This Encounter  Procedures  . Ambulatory referral to Physical Therapy    Referral Priority:   Routine    Referral Type:   Physical Medicine    Referral Reason:   Specialty Services Required    Requested Specialty:   Physical  Therapy   No orders of the defined types were placed in this encounter.   Discussed warning signs or symptoms. Please see discharge instructions. Patient expresses understanding.   The above documentation has been reviewed and is accurate and complete Lynne Leader, M.D.

## 2019-12-10 NOTE — Patient Instructions (Addendum)
Thank you for coming in today. Plan for heating pad and TENS unit.  Plan for PT.  Schedule for about 2 weeks into the future.  If still hurting go to PT.  If better cancel.   TENS UNIT: This is helpful for muscle pain and spasm.   Search and Purchase a TENS 7000 2nd edition at  www.tenspros.com or www.[employer].com It should be less than $30.     TENS unit instructions: Do not shower or bathe with the unit on Turn the unit off before removing electrodes or batteries If the electrodes lose stickiness add a drop of water to the electrodes after they are disconnected from the unit and place on plastic sheet. If you continued to have difficulty, call the TENS unit company to purchase more electrodes. Do not apply lotion on the skin area prior to use. Make sure the skin is clean and dry as this will help prolong the life of the electrodes. After use, always check skin for unusual red areas, rash or other skin difficulties. If there are any skin problems, does not apply electrodes to the same area. Never remove the electrodes from the unit by pulling the wires. Do not use the TENS unit or electrodes other than as directed. Do not change electrode placement without consultating your therapist or physician. Keep 2 fingers with between each electrode. Wear time ratio is 2:1, on to off times.    For example on for 30 minutes off for 15 minutes and then on for 30 minutes off for 15 minutes    Cervical Strain and Sprain Rehab Ask your health care provider which exercises are safe for you. Do exercises exactly as told by your health care provider and adjust them as directed. It is normal to feel mild stretching, pulling, tightness, or discomfort as you do these exercises. Stop right away if you feel sudden pain or your pain gets worse. Do not begin these exercises until told by your health care provider. Stretching and range-of-motion exercises Cervical side bending  1. Using good posture, sit on  a stable chair or stand up. 2. Without moving your shoulders, slowly tilt your left / right ear to your shoulder until you feel a stretch in the opposite side neck muscles. You should be looking straight ahead. 3. Hold for __________ seconds. 4. Repeat with the other side of your neck. Repeat __________ times. Complete this exercise __________ times a day. Cervical rotation  1. Using good posture, sit on a stable chair or stand up. 2. Slowly turn your head to the side as if you are looking over your left / right shoulder. ? Keep your eyes level with the ground. ? Stop when you feel a stretch along the side and the back of your neck. 3. Hold for __________ seconds. 4. Repeat this by turning to your other side. Repeat __________ times. Complete this exercise __________ times a day. Thoracic extension and pectoral stretch 1. Roll a towel or a small blanket so it is about 4 inches (10 cm) in diameter. 2. Lie down on your back on a firm surface. 3. Put the towel lengthwise, under your spine in the middle of your back. It should not be under your shoulder blades. The towel should line up with your spine from your middle back to your lower back. 4. Put your hands behind your head and let your elbows fall out to your sides. 5. Hold for __________ seconds. Repeat __________ times. Complete this exercise __________ times a  day. Strengthening exercises Isometric upper cervical flexion 1. Lie on your back with a thin pillow behind your head and a small rolled-up towel under your neck. 2. Gently tuck your chin toward your chest and nod your head down to look toward your feet. Do not lift your head off the pillow. 3. Hold for __________ seconds. 4. Release the tension slowly. Relax your neck muscles completely before you repeat this exercise. Repeat __________ times. Complete this exercise __________ times a day. Isometric cervical extension  1. Stand about 6 inches (15 cm) away from a wall, with  your back facing the wall. 2. Place a soft object, about 6-8 inches (15-20 cm) in diameter, between the back of your head and the wall. A soft object could be a small pillow, a ball, or a folded towel. 3. Gently tilt your head back and press into the soft object. Keep your jaw and forehead relaxed. 4. Hold for __________ seconds. 5. Release the tension slowly. Relax your neck muscles completely before you repeat this exercise. Repeat __________ times. Complete this exercise __________ times a day. Posture and body mechanics Body mechanics refers to the movements and positions of your body while you do your daily activities. Posture is part of body mechanics. Good posture and healthy body mechanics can help to relieve stress in your body's tissues and joints. Good posture means that your spine is in its natural S-curve position (your spine is neutral), your shoulders are pulled back slightly, and your head is not tipped forward. The following are general guidelines for applying improved posture and body mechanics to your everyday activities. Sitting  1. When sitting, keep your spine neutral and keep your feet flat on the floor. Use a footrest, if necessary, and keep your thighs parallel to the floor. Avoid rounding your shoulders, and avoid tilting your head forward. 2. When working at a desk or a computer, keep your desk at a height where your hands are slightly lower than your elbows. Slide your chair under your desk so you are close enough to maintain good posture. 3. When working at a computer, place your monitor at a height where you are looking straight ahead and you do not have to tilt your head forward or downward to look at the screen. Standing   When standing, keep your spine neutral and keep your feet about hip-width apart. Keep a slight bend in your knees. Your ears, shoulders, and hips should line up.  When you do a task in which you stand in one place for a long time, place one foot up  on a stable object that is 2-4 inches (5-10 cm) high, such as a footstool. This helps keep your spine neutral. Resting When lying down and resting, avoid positions that are most painful for you. Try to support your neck in a neutral position. You can use a contour pillow or a small rolled-up towel. Your pillow should support your neck but not push on it. This information is not intended to replace advice given to you by your health care provider. Make sure you discuss any questions you have with your health care provider. Document Revised: 07/18/2018 Document Reviewed: 12/27/2017 Elsevier Patient Education  [place].

## 2019-12-11 DIAGNOSIS — J449 Chronic obstructive pulmonary disease, unspecified: Secondary | ICD-10-CM | POA: Diagnosis not present

## 2019-12-18 DIAGNOSIS — H401122 Primary open-angle glaucoma, left eye, moderate stage: Secondary | ICD-10-CM | POA: Diagnosis not present

## 2019-12-18 DIAGNOSIS — H5201 Hypermetropia, right eye: Secondary | ICD-10-CM | POA: Diagnosis not present

## 2019-12-18 DIAGNOSIS — H5212 Myopia, left eye: Secondary | ICD-10-CM | POA: Diagnosis not present

## 2019-12-18 DIAGNOSIS — H401111 Primary open-angle glaucoma, right eye, mild stage: Secondary | ICD-10-CM | POA: Diagnosis not present

## 2019-12-18 DIAGNOSIS — Z961 Presence of intraocular lens: Secondary | ICD-10-CM | POA: Diagnosis not present

## 2019-12-18 DIAGNOSIS — H524 Presbyopia: Secondary | ICD-10-CM | POA: Diagnosis not present

## 2019-12-18 DIAGNOSIS — H35373 Puckering of macula, bilateral: Secondary | ICD-10-CM | POA: Diagnosis not present

## 2019-12-30 ENCOUNTER — Other Ambulatory Visit: Payer: Self-pay | Admitting: Internal Medicine

## 2019-12-30 DIAGNOSIS — M436 Torticollis: Secondary | ICD-10-CM

## 2020-01-01 ENCOUNTER — Ambulatory Visit: Payer: Medicare PPO | Admitting: Physical Therapy

## 2020-01-01 ENCOUNTER — Other Ambulatory Visit: Payer: Self-pay

## 2020-01-01 ENCOUNTER — Encounter: Payer: Self-pay | Admitting: Physical Therapy

## 2020-01-01 DIAGNOSIS — M542 Cervicalgia: Secondary | ICD-10-CM

## 2020-01-01 NOTE — Patient Instructions (Signed)
Access Code: [email or web address] URL: [email or web address] Date: 01/01/2020 Prepared by: Lyndee Hensen  Exercises Supine Pectoralis Stretch - 2 x daily - 3 reps - 30 hold Doorway Pec Stretch at 60 Elevation - 2 x daily - 3 reps - 30 hold Standing Row with Anchored Resistance - 1 x daily - 2 sets - 10 reps Seated Cervical Sidebending Stretch - 2 x daily - 3 reps - 30 hold

## 2020-01-05 ENCOUNTER — Encounter: Payer: Self-pay | Admitting: Physical Therapy

## 2020-01-05 NOTE — Therapy (Signed)
[facility] [address] [place], [place], [address] Phone: [phone]   Fax:  [phone]  Physical Therapy Evaluation  Patient Details  Name: Joshua Cross MRN: [id number] Date of Birth: 1945/05/30 Referring Provider (PT): Lynne Leader   Encounter Date: 01/01/2020   PT End of Session - 01/05/20 2144    Visit Number 1    Number of Visits 12    Date for PT Re-Evaluation 02/12/20    Authorization Type [employer]    PT Start Time 1300    PT Stop Time 1340    PT Time Calculation (min) 40 min    Activity Tolerance Patient tolerated treatment well    Behavior During Therapy [facility] for tasks assessed/performed           Past Medical History:  Diagnosis Date   Cataract    COPD (chronic obstructive pulmonary disease) ([place])    Hyperlipidemia     Past Surgical History:  Procedure Laterality Date   cataracts Bilateral 2019   HERNIA REPAIR     LUNG REMOVAL, PARTIAL Right 1985   TONSILLECTOMY      There were no vitals filed for this visit.    Subjective Assessment - 01/05/20 2143    Subjective Pt has pain in R side of neck. Wears 3 L O2 at night.    Currently in Pain? Yes    Pain Score 4     Pain Location Neck    Pain Orientation Right    Pain Descriptors / Indicators Aching;Spasm;Tightness    Pain Type Acute pain    Pain Onset More than a month ago    Pain Frequency Intermittent              [facility] PT Assessment - 01/05/20 0001      Assessment   Medical Diagnosis Neck pain    Referring Provider (PT) Lynne Leader    Hand Dominance Right    Prior Therapy no      Balance Screen   Has the patient fallen in the past 6 months No      Prior Function   Level of Independence Independent      Cognition   Overall Cognitive Status Within Functional Limits for tasks assessed      Posture/Postural Control   Posture Comments tight anterior shoulders/pecs, and UTs. rounded shoulders.       ROM / Strength   AROM /  PROM / Strength AROM;Strength      AROM   AROM Assessment Site Cervical    Cervical Flexion mild limitation    Cervical Extension mild limitation    Cervical - Right Side Bend mod limitation    Cervical - Left Side Bend mod limitation    Cervical - Right Rotation mild limitation    Cervical - Left Rotation mild limitation      Strength   Overall Strength Comments Shoulder: 4/5 gross       Palpation   Palpation comment Tight anterior shoulder/pec musculature, soreness and hypertonicity in UT and bil cervical paraspinals                       Objective measurements completed on examination: See above findings.       [facility] Adult PT Treatment/Exercise - 01/05/20 0001      Exercises   Exercises Neck      Neck Exercises: Theraband   Rows 20 reps      Neck Exercises: Stretches  Upper Trapezius Stretch 2 reps;30 seconds    Corner Stretch 3 reps;30 seconds    Corner Stretch Limitations doorway    Other Neck Stretches Supine pec stretch 30 sec x 3;                   PT Education - 01/05/20 2204    Education Details PT POC, exam findings, initial HEP    Person(s) Educated Patient    Methods Explanation;Demonstration;Tactile cues;Verbal cues;Handout    Comprehension Verbalized understanding;Returned demonstration;Verbal cues required;Tactile cues required;Need further instruction            PT Short Term Goals - 01/05/20 2204      PT SHORT TERM GOAL #1   Title Pt to be independent with initial HEP    Time 2    Period Weeks    Status New    Target Date 01/15/20             PT Long Term Goals - 01/05/20 2205      PT LONG TERM GOAL #1   Title Pt to be independent with final HEP    Time 6    Period Weeks    Status New    Target Date 02/12/20      PT LONG TERM GOAL #2   Title Pt to demo improved ROM for c-spine to be WNL for rotation and flexion    Time 6    Period Weeks    Status New    Target Date 02/12/20      PT LONG TERM GOAL #3    Title Pt to report pain in neck region to 0-2/10 with activity and ROM.    Time 6    Period Weeks    Status New    Target Date 02/12/20      PT LONG TERM GOAL #4   Title Pt to demo ability for full shoulder ROM and strengthening wtihout pain in upper trap or neck region.    Time 6    Period Weeks    Status New    Target Date 02/12/20                  Plan - 01/05/20 2208    Clinical Impression Statement Pt presents with primary complaint of increased tightness and pain in c-spine. Pain has improved somewhat since MD visit. Pt has tight anterior musculature, due to posture, breathing, and copd. He has decreased HEP for posture and postural strengthening. He has increased tightness and pain in UT and cervical region. Does have mild sitffness for SB motions. Pt to benefit from skilled PT to improve deficits and pain.    Personal Factors and Comorbidities Comorbidity 1    Comorbidities COPD/ Wears O2 at home and at night    Examination-Activity Limitations Reach Overhead;Sleep;Lift    Examination-Participation Restrictions Yard Work;Cleaning;Community Activity;Occupation    Stability/Clinical Decision Making Stable/Uncomplicated    Clinical Decision Making Low    Rehab Potential Good    PT Frequency 2x / week    PT Duration 6 weeks    PT Treatment/Interventions ADLs/Self Care Home Management;Cryotherapy;Electrical Stimulation;DME Instruction;Ultrasound;Traction;Moist Heat;Iontophoresis 4mg /ml Dexamethasone;Gait training;Stair training;Functional mobility training;Therapeutic activities;Therapeutic exercise;Patient/family education;Neuromuscular re-education;Manual techniques;Passive range of motion;Dry needling;Taping;Visual/perceptual remediation/compensation;Energy conservation;Spinal Manipulations;Joint Manipulations    Consulted and Agree with Plan of Care Patient           Patient will benefit from skilled therapeutic intervention in order to improve the following  deficits and impairments:  Pain, Decreased mobility,  Increased muscle spasms, Decreased strength, Decreased range of motion, Decreased activity tolerance, Impaired flexibility  Visit Diagnosis: Cervicalgia     Problem List Patient Active Problem List   Diagnosis Date Noted   Wryneck 12/03/2019   Squamous cell carcinoma in situ (SCCIS) of skin 12/18/2018   Benign prostatic hyperplasia without lower urinary tract symptoms 05/03/2018   Routine general medical examination at a health care facility 05/03/2018   Type II diabetes mellitus with manifestations ([place]) 05/02/2018   COPD exacerbation ([place]) 12/15/2017   Hyperlipidemia with target LDL less than 130 12/06/2016   Seasonal allergic rhinitis due to pollen 07/26/2016   Mixed simple and mucopurulent chronic bronchitis ([place]) 06/07/2016    Lyndee Hensen, PT, DPT 10:12 PM  01/05/20    [facility] [address] [place], [place], [address] Phone: [phone]   Fax:  [phone]  Name: Joshua Cross MRN: [id number] Date of Birth: Jun 22, 1945

## 2020-01-10 ENCOUNTER — Other Ambulatory Visit: Payer: Self-pay | Admitting: Internal Medicine

## 2020-01-10 DIAGNOSIS — J449 Chronic obstructive pulmonary disease, unspecified: Secondary | ICD-10-CM | POA: Diagnosis not present

## 2020-01-10 DIAGNOSIS — M436 Torticollis: Secondary | ICD-10-CM

## 2020-01-16 ENCOUNTER — Other Ambulatory Visit: Payer: Self-pay

## 2020-01-16 ENCOUNTER — Encounter: Payer: Self-pay | Admitting: Physical Therapy

## 2020-01-16 ENCOUNTER — Ambulatory Visit: Payer: Medicare PPO | Admitting: Physical Therapy

## 2020-01-16 DIAGNOSIS — M542 Cervicalgia: Secondary | ICD-10-CM

## 2020-01-16 NOTE — Therapy (Signed)
[facility] [address] [place], [place], [address] Phone: [phone]   Fax:  [phone]  Physical Therapy Treatment  Patient Details  Name: Joshua Cross MRN: [id number] Date of Birth: 09/11/1945 Referring Provider (PT): Lynne Leader   Encounter Date: 01/16/2020   PT End of Session - 01/16/20 1548    Visit Number 2    Number of Visits 12    Date for PT Re-Evaluation 02/12/20    Authorization Type [employer]    PT Start Time 0347    PT Stop Time 1428    PT Time Calculation (min) 40 min    Activity Tolerance Patient tolerated treatment well    Behavior During Therapy [facility] for tasks assessed/performed           Past Medical History:  Diagnosis Date  . Cataract   . COPD (chronic obstructive pulmonary disease) ([place])   . Hyperlipidemia     Past Surgical History:  Procedure Laterality Date  . cataracts Bilateral 2019  . HERNIA REPAIR    . LUNG REMOVAL, PARTIAL Right 1985  . TONSILLECTOMY      There were no vitals filed for this visit.   Subjective Assessment - 01/16/20 1547    Subjective Pt states decreasing pain in R side of neck. Feels "tight"    Currently in Pain? Yes    Pain Location Neck    Pain Orientation Right    Pain Descriptors / Indicators Aching;Tightness    Pain Type Acute pain    Pain Onset More than a month ago    Pain Frequency Intermittent                             [facility] Adult PT Treatment/Exercise - 01/16/20 1356      Bed Mobility   Bed Mobility --      Posture/Postural Control   Posture Comments tight anterior shoulders/pecs, and UTs. rounded shoulders.       Exercises   Exercises Neck      Neck Exercises: Theraband   Rows 20 reps;Green      Neck Exercises: Standing   Other Standing Exercises Flexion AROM x 20;       Neck Exercises: Supine   Other Supine Exercise Horizontal abd 2x 10;       Manual Therapy   Manual Therapy Joint mobilization;Soft tissue  mobilization;Passive ROM;Manual Traction    Joint Mobilization PA mobs- cervical     Soft tissue mobilization STM/DTM to R UT and bil cervical paraspinals    Passive ROM for c-spine     Manual Traction 10 sec x 10      Neck Exercises: Stretches   Upper Trapezius Stretch 2 reps;30 seconds    Corner Stretch 3 reps;30 seconds    Corner Stretch Limitations doorway    Other Neck Stretches Supine pec stretch 30 sec x 3;                     PT Short Term Goals - 01/05/20 2204      PT SHORT TERM GOAL #1   Title Pt to be independent with initial HEP    Time 2    Period Weeks    Status New    Target Date 01/15/20             PT Long Term Goals - 01/05/20 2205      PT LONG TERM GOAL #1  Title Pt to be independent with final HEP    Time 6    Period Weeks    Status New    Target Date 02/12/20      PT LONG TERM GOAL #2   Title Pt to demo improved ROM for c-spine to be WNL for rotation and flexion    Time 6    Period Weeks    Status New    Target Date 02/12/20      PT LONG TERM GOAL #3   Title Pt to report pain in neck region to 0-2/10 with activity and ROM.    Time 6    Period Weeks    Status New    Target Date 02/12/20      PT LONG TERM GOAL #4   Title Pt to demo ability for full shoulder ROM and strengthening wtihout pain in upper trap or neck region.    Time 6    Period Weeks    Status New    Target Date 02/12/20                 Plan - 01/16/20 1548    Clinical Impression Statement Reviewed HEP and ther ex for posture and stretching for anterior musculature. Manual done for muscle tightenss and pain. Decreased tenderness in R musculature today. Plan to progress as tolerated.    Personal Factors and Comorbidities Comorbidity 1    Comorbidities COPD/ Wears O2 at home and at night    Examination-Activity Limitations Reach Overhead;Sleep;Lift    Examination-Participation Restrictions Yard Work;Cleaning;Community Activity;Occupation     Stability/Clinical Decision Making Stable/Uncomplicated    Rehab Potential Good    PT Frequency 2x / week    PT Duration 6 weeks    PT Treatment/Interventions ADLs/Self Care Home Management;Cryotherapy;Electrical Stimulation;DME Instruction;Ultrasound;Traction;Moist Heat;Iontophoresis 4mg /ml Dexamethasone;Gait training;Stair training;Functional mobility training;Therapeutic activities;Therapeutic exercise;Patient/family education;Neuromuscular re-education;Manual techniques;Passive range of motion;Dry needling;Taping;Visual/perceptual remediation/compensation;Energy conservation;Spinal Manipulations;Joint Manipulations    Consulted and Agree with Plan of Care Patient           Patient will benefit from skilled therapeutic intervention in order to improve the following deficits and impairments:  Pain, Decreased mobility, Increased muscle spasms, Decreased strength, Decreased range of motion, Decreased activity tolerance, Impaired flexibility  Visit Diagnosis: Cervicalgia     Problem List Patient Active Problem List   Diagnosis Date Noted  . Wryneck 12/03/2019  . Squamous cell carcinoma in situ (SCCIS) of skin 12/18/2018  . Benign prostatic hyperplasia without lower urinary tract symptoms 05/03/2018  . Routine general medical examination at a health care facility 05/03/2018  . Type II diabetes mellitus with manifestations ([place]) 05/02/2018  . COPD exacerbation ([place]) 12/15/2017  . Hyperlipidemia with target LDL less than 130 12/06/2016  . Seasonal allergic rhinitis due to pollen 07/26/2016  . Mixed simple and mucopurulent chronic bronchitis ([place]) 06/07/2016   Lyndee Hensen, PT, DPT 3:49 PM  01/16/20    [place], [place], [address] Phone: [phone]   Fax:  [phone]  Name: Joshua Cross MRN: [id number] Date of Birth: February 14, 1946

## 2020-01-21 DIAGNOSIS — L814 Other melanin hyperpigmentation: Secondary | ICD-10-CM | POA: Diagnosis not present

## 2020-01-21 DIAGNOSIS — L570 Actinic keratosis: Secondary | ICD-10-CM | POA: Diagnosis not present

## 2020-01-21 DIAGNOSIS — D485 Neoplasm of uncertain behavior of skin: Secondary | ICD-10-CM | POA: Diagnosis not present

## 2020-01-21 DIAGNOSIS — L821 Other seborrheic keratosis: Secondary | ICD-10-CM | POA: Diagnosis not present

## 2020-01-21 DIAGNOSIS — Z85828 Personal history of other malignant neoplasm of skin: Secondary | ICD-10-CM | POA: Diagnosis not present

## 2020-01-21 DIAGNOSIS — L430 Hypertrophic lichen planus: Secondary | ICD-10-CM | POA: Diagnosis not present

## 2020-01-29 ENCOUNTER — Encounter: Payer: Medicare PPO | Admitting: Physical Therapy

## 2020-02-04 ENCOUNTER — Encounter: Payer: Self-pay | Admitting: Physical Therapy

## 2020-02-04 ENCOUNTER — Other Ambulatory Visit: Payer: Self-pay

## 2020-02-04 ENCOUNTER — Ambulatory Visit: Payer: Medicare PPO | Admitting: Physical Therapy

## 2020-02-04 DIAGNOSIS — M542 Cervicalgia: Secondary | ICD-10-CM | POA: Diagnosis not present

## 2020-02-04 NOTE — Therapy (Signed)
[facility] [address] [place], [place], [address] Phone: [phone]   Fax:  [phone]  Physical Therapy Treatment/Discharge   Patient Details  Name: Joshua Cross MRN: [id number] Date of Birth: 1945-05-23 Referring Provider (PT): Lynne Leader   Encounter Date: 02/04/2020   PT End of Session - 02/04/20 1537    Visit Number 3    Number of Visits 12    Date for PT Re-Evaluation 02/12/20    Authorization Type [employer]    PT Start Time 1515    PT Stop Time 1553    PT Time Calculation (min) 38 min    Activity Tolerance Patient tolerated treatment well    Behavior During Therapy [facility] for tasks assessed/performed           Past Medical History:  Diagnosis Date   Cataract    COPD (chronic obstructive pulmonary disease) ([place])    Hyperlipidemia     Past Surgical History:  Procedure Laterality Date   cataracts Bilateral 2019   HERNIA REPAIR     LUNG REMOVAL, PARTIAL Right 1985   TONSILLECTOMY      There were no vitals filed for this visit.   Subjective Assessment - 02/04/20 1641    Subjective Pt states no pain in neck since last visit, doing well    Currently in Pain? No/denies    Pain Score 0-No pain                             [facility] Adult PT Treatment/Exercise - 02/04/20 1521      Posture/Postural Control   Posture Comments tight anterior shoulders/pecs, and UTs. rounded shoulders.       Exercises   Exercises Neck      Neck Exercises: Theraband   Rows 20 reps;Green    Shoulder External Rotation 15 reps;Red    Horizontal ABduction 15 reps;Red      Neck Exercises: Standing   Other Standing Exercises Flexion AROM x 20;     Other Standing Exercises wall push ups x 20; Shoulder flexion arom x 15;       Neck Exercises: Supine   Other Supine Exercise --      Manual Therapy   Manual Therapy Joint mobilization;Soft tissue mobilization;Passive ROM;Manual Traction    Joint Mobilization  PA mobs- cervical     Soft tissue mobilization STM/DTM to R UT and bil cervical paraspinals    Passive ROM for c-spine     Manual Traction 10 sec x 10      Neck Exercises: Stretches   Upper Trapezius Stretch 2 reps;30 seconds    Corner Stretch 3 reps;30 seconds    Corner Stretch Limitations doorway    Other Neck Stretches Supine pec stretch 30 sec x 3;                   PT Education - 02/04/20 1642    Education Details HEp reviewed today    Person(s) Educated Patient    Methods Explanation;Demonstration;Tactile cues;Verbal cues;Handout    Comprehension Verbalized understanding;Returned demonstration;Verbal cues required            PT Short Term Goals - 02/04/20 1642      PT SHORT TERM GOAL #1   Title Pt to be independent with initial HEP    Time 2    Period Weeks    Status Achieved    Target Date 01/15/20  PT Long Term Goals - 02/04/20 1642      PT LONG TERM GOAL #1   Title Pt to be independent with final HEP    Time 6    Period Weeks    Status Achieved      PT LONG TERM GOAL #2   Title Pt to demo improved ROM for c-spine to be WNL for rotation and flexion    Time 6    Period Weeks    Status Achieved      PT LONG TERM GOAL #3   Title Pt to report pain in neck region to 0-2/10 with activity and ROM.    Time 6    Period Weeks    Status Achieved      PT LONG TERM GOAL #4   Title Pt to demo ability for full shoulder ROM and strengthening wtihout pain in upper trap or neck region.    Time 6    Period Weeks    Status Achieved                 Plan - 02/04/20 1643    Clinical Impression Statement Pt has made good progress, has met goals at this time. final HEP reviewed today, pt ready for d/c to HEP, pt in agreement with plan.    Personal Factors and Comorbidities Comorbidity 1    Comorbidities COPD/ Wears O2 at home and at night    Examination-Activity Limitations Reach Overhead;Sleep;Lift    Examination-Participation  Restrictions Yard Work;Cleaning;Community Activity;Occupation    Stability/Clinical Decision Making Stable/Uncomplicated    Rehab Potential Good    PT Frequency 2x / week    PT Duration 6 weeks    PT Treatment/Interventions ADLs/Self Care Home Management;Cryotherapy;Electrical Stimulation;DME Instruction;Ultrasound;Traction;Moist Heat;Iontophoresis 4mg /ml Dexamethasone;Gait training;Stair training;Functional mobility training;Therapeutic activities;Therapeutic exercise;Patient/family education;Neuromuscular re-education;Manual techniques;Passive range of motion;Dry needling;Taping;Visual/perceptual remediation/compensation;Energy conservation;Spinal Manipulations;Joint Manipulations    Consulted and Agree with Plan of Care Patient           Patient will benefit from skilled therapeutic intervention in order to improve the following deficits and impairments:  Pain, Decreased mobility, Increased muscle spasms, Decreased strength, Decreased range of motion, Decreased activity tolerance, Impaired flexibility  Visit Diagnosis: Cervicalgia     Problem List Patient Active Problem List   Diagnosis Date Noted   [place] 12/03/2019   Squamous cell carcinoma in situ (SCCIS) of skin 12/18/2018   Benign prostatic hyperplasia without lower urinary tract symptoms 05/03/2018   Routine general medical examination at a health care facility 05/03/2018   Type II diabetes mellitus with manifestations ([place]) 05/02/2018   COPD exacerbation ([place]) 12/15/2017   Hyperlipidemia with target LDL less than 130 12/06/2016   Seasonal allergic rhinitis due to pollen 07/26/2016   Mixed simple and mucopurulent chronic bronchitis ([place]) 06/07/2016    Lyndee Hensen, PT, DPT 4:45 PM  02/04/20    [facility] [address] [place], [place], [address] Phone: [phone]   Fax:  [phone]  Name: Joshua Cross MRN: [id number] Date of Birth: 10/10/1945    PHYSICAL THERAPY DISCHARGE SUMMARY  Visits from Start of Care: 3 Plan: Patient agrees to discharge.  Patient goals were met. Patient is being discharged due to meeting the stated rehab goals.  ?????      Lyndee Hensen, PT, DPT 4:46 PM  02/04/20

## 2020-02-04 NOTE — Patient Instructions (Addendum)
Access Code: [email or web address] URL: [email or web address] Date: 02/04/2020 Prepared by: Lyndee Hensen  Exercises Doorway Pec Stretch at 60 Elevation - 2 x daily - 3 reps - 30 hold Seated Cervical Sidebending Stretch - 2 x daily - 3 reps - 30 hold Standing Row with Anchored Resistance - 1 x daily - 2 sets - 10 reps Shoulder External Rotation and Scapular Retraction with Resistance - 1 x daily - 2 sets - 10 reps Standing Shoulder Horizontal Abduction with Resistance - 1 x daily - 2 sets - 10 reps Standing Shoulder Scaption - 1 x daily - 2 sets - 10 reps Tricep Push Up on Wall - 1 x daily - 2 sets - 10 reps

## 2020-02-10 DIAGNOSIS — J449 Chronic obstructive pulmonary disease, unspecified: Secondary | ICD-10-CM | POA: Diagnosis not present

## 2020-02-15 ENCOUNTER — Other Ambulatory Visit: Payer: Self-pay | Admitting: Internal Medicine

## 2020-02-15 DIAGNOSIS — F3342 Major depressive disorder, recurrent, in full remission: Secondary | ICD-10-CM

## 2020-02-26 DIAGNOSIS — H401122 Primary open-angle glaucoma, left eye, moderate stage: Secondary | ICD-10-CM | POA: Diagnosis not present

## 2020-02-26 DIAGNOSIS — H401111 Primary open-angle glaucoma, right eye, mild stage: Secondary | ICD-10-CM | POA: Diagnosis not present

## 2020-03-11 DIAGNOSIS — J449 Chronic obstructive pulmonary disease, unspecified: Secondary | ICD-10-CM | POA: Diagnosis not present

## 2020-03-13 DIAGNOSIS — H1033 Unspecified acute conjunctivitis, bilateral: Secondary | ICD-10-CM | POA: Diagnosis not present

## 2020-03-24 ENCOUNTER — Other Ambulatory Visit: Payer: Self-pay | Admitting: Internal Medicine

## 2020-03-24 DIAGNOSIS — J9611 Chronic respiratory failure with hypoxia: Secondary | ICD-10-CM | POA: Insufficient documentation

## 2020-03-24 DIAGNOSIS — J418 Mixed simple and mucopurulent chronic bronchitis: Secondary | ICD-10-CM

## 2020-03-31 ENCOUNTER — Other Ambulatory Visit: Payer: Self-pay | Admitting: Internal Medicine

## 2020-03-31 DIAGNOSIS — J418 Mixed simple and mucopurulent chronic bronchitis: Secondary | ICD-10-CM

## 2020-04-05 ENCOUNTER — Other Ambulatory Visit: Payer: Self-pay | Admitting: Internal Medicine

## 2020-04-05 DIAGNOSIS — J418 Mixed simple and mucopurulent chronic bronchitis: Secondary | ICD-10-CM

## 2020-04-11 DIAGNOSIS — J449 Chronic obstructive pulmonary disease, unspecified: Secondary | ICD-10-CM | POA: Diagnosis not present

## 2020-04-16 ENCOUNTER — Telehealth: Payer: Self-pay | Admitting: Internal Medicine

## 2020-04-16 NOTE — Telephone Encounter (Signed)
[facility] specialty pharmacy calling to make [place] aware that the patient needs prior auth for BROVANA 15 MCG/2ML NEBU at the first of the year and they have already initiated the process in covermymeds and the key was [facility]

## 2020-04-17 NOTE — Telephone Encounter (Signed)
Key: Blair Hailey

## 2020-04-24 DIAGNOSIS — H401131 Primary open-angle glaucoma, bilateral, mild stage: Secondary | ICD-10-CM | POA: Diagnosis not present

## 2020-04-24 NOTE — Telephone Encounter (Signed)
Anda Kraft w/ [facility] called and was wondering the status of the PA. She can be reached at [phone]

## 2020-04-24 NOTE — Telephone Encounter (Signed)
PA was initiated last week, however, I received a hardcopy form of PA today. I will also fill that out and send back today.

## 2020-04-27 ENCOUNTER — Other Ambulatory Visit: Payer: Self-pay | Admitting: Internal Medicine

## 2020-04-27 DIAGNOSIS — J418 Mixed simple and mucopurulent chronic bronchitis: Secondary | ICD-10-CM

## 2020-05-05 ENCOUNTER — Other Ambulatory Visit: Payer: Self-pay | Admitting: Internal Medicine

## 2020-05-05 DIAGNOSIS — J9611 Chronic respiratory failure with hypoxia: Secondary | ICD-10-CM

## 2020-05-05 DIAGNOSIS — J418 Mixed simple and mucopurulent chronic bronchitis: Secondary | ICD-10-CM

## 2020-05-05 MED ORDER — FORMOTEROL FUMARATE 20 MCG/2ML IN NEBU: 20.0000 ug | INHALATION_SOLUTION | Freq: Two times a day (BID) | RESPIRATORY_TRACT | 1 refills | Status: DC

## 2020-05-08 ENCOUNTER — Telehealth: Payer: Self-pay

## 2020-05-08 NOTE — Telephone Encounter (Signed)
Key: [id number]

## 2020-05-11 NOTE — Telephone Encounter (Signed)
I received confirmation that PA was APPROVED  Determination stated that it was denied under Medicare Part D but was picked up and approved by Medicare part B.   Determination was sent to scan.

## 2020-05-11 NOTE — Telephone Encounter (Signed)
PA was denied

## 2020-05-14 ENCOUNTER — Other Ambulatory Visit: Payer: Self-pay | Admitting: Internal Medicine

## 2020-05-14 DIAGNOSIS — E7800 Pure hypercholesterolemia, unspecified: Secondary | ICD-10-CM

## 2020-05-15 ENCOUNTER — Other Ambulatory Visit: Payer: Self-pay | Admitting: Internal Medicine

## 2020-05-15 DIAGNOSIS — J418 Mixed simple and mucopurulent chronic bronchitis: Secondary | ICD-10-CM

## 2020-05-19 ENCOUNTER — Encounter: Payer: Self-pay | Admitting: Internal Medicine

## 2020-05-19 ENCOUNTER — Telehealth: Payer: Self-pay | Admitting: Internal Medicine

## 2020-05-19 NOTE — Telephone Encounter (Signed)
Dearing w/ [employer] called and was wondering if there was a change in the patients medication. She said she had spoken with the patient and he informed her that his insurance company is no longer covering his medications. She said that the patient was not giving specific names of the medications. She wanted to reach out and see if there were any changes.   She said to ask for [facility] when calling back.

## 2020-05-22 ENCOUNTER — Telehealth: Payer: Self-pay | Admitting: Internal Medicine

## 2020-05-22 NOTE — Telephone Encounter (Signed)
Joshua Cross with [facility] specialty pharmacy calling, states that the patient is calling confused about some medications for his COPD, patient has also been in touch with [employer] and they have changed some medications and they are trying to clarify some things for the patient  Toll free: [phone] Regular number [phone]

## 2020-05-22 NOTE — Telephone Encounter (Signed)
Anda Kraft needed clarification if the pt is to still use all 3 listed below in his nebulizer.  Pulmicort Perforomist Lonhala  She stated that [facility] told the pt that he was to replace them all by using the Perforomist. Please advise.

## 2020-05-24 NOTE — Telephone Encounter (Signed)
He needs to stay on all 3 inhalers

## 2020-05-25 NOTE — Telephone Encounter (Signed)
Pt has been informed and expressed understanding.  

## 2020-06-19 DIAGNOSIS — H401131 Primary open-angle glaucoma, bilateral, mild stage: Secondary | ICD-10-CM | POA: Diagnosis not present

## 2020-06-19 DIAGNOSIS — H35352 Cystoid macular degeneration, left eye: Secondary | ICD-10-CM | POA: Diagnosis not present

## 2020-07-02 ENCOUNTER — Encounter: Payer: Self-pay | Admitting: Internal Medicine

## 2020-07-21 DIAGNOSIS — Z85828 Personal history of other malignant neoplasm of skin: Secondary | ICD-10-CM | POA: Diagnosis not present

## 2020-07-21 DIAGNOSIS — L821 Other seborrheic keratosis: Secondary | ICD-10-CM | POA: Diagnosis not present

## 2020-07-21 DIAGNOSIS — D2261 Melanocytic nevi of right upper limb, including shoulder: Secondary | ICD-10-CM | POA: Diagnosis not present

## 2020-07-21 DIAGNOSIS — D2262 Melanocytic nevi of left upper limb, including shoulder: Secondary | ICD-10-CM | POA: Diagnosis not present

## 2020-07-21 DIAGNOSIS — L814 Other melanin hyperpigmentation: Secondary | ICD-10-CM | POA: Diagnosis not present

## 2020-07-21 DIAGNOSIS — D1801 Hemangioma of skin and subcutaneous tissue: Secondary | ICD-10-CM | POA: Diagnosis not present

## 2020-07-21 DIAGNOSIS — L570 Actinic keratosis: Secondary | ICD-10-CM | POA: Diagnosis not present

## 2020-07-21 DIAGNOSIS — D225 Melanocytic nevi of trunk: Secondary | ICD-10-CM | POA: Diagnosis not present

## 2020-08-23 ENCOUNTER — Other Ambulatory Visit: Payer: Self-pay | Admitting: Internal Medicine

## 2020-08-23 DIAGNOSIS — F3342 Major depressive disorder, recurrent, in full remission: Secondary | ICD-10-CM

## 2020-09-08 ENCOUNTER — Ambulatory Visit (AMBULATORY_SURGERY_CENTER): Payer: Self-pay | Admitting: *Deleted

## 2020-09-08 ENCOUNTER — Other Ambulatory Visit: Payer: Self-pay

## 2020-09-08 ENCOUNTER — Telehealth: Payer: Self-pay | Admitting: *Deleted

## 2020-09-08 VITALS — Ht 67.5 in | Wt 128.0 lb

## 2020-09-08 DIAGNOSIS — Z9981 Dependence on supplemental oxygen: Secondary | ICD-10-CM

## 2020-09-08 DIAGNOSIS — Z86010 Personal history of colonic polyps: Secondary | ICD-10-CM

## 2020-09-08 NOTE — Telephone Encounter (Signed)
It would be best if he sees me in the office given his age and comorbidities.  Thanks

## 2020-09-08 NOTE — Progress Notes (Signed)
No egg or soy allergy known to patient  No issues with past sedation with any surgeries or procedures Patient denies ever being told they had issues or difficulty with intubation  No FH of Malignant Hyperthermia No diet pills per patient No home 02 use per patient  No blood thinners per patient  Pt denies issues with constipation  No A fib or A flutter  EMMI video to pt or via MyChart  COVID 19 guidelines implemented in PV today with Pt and RN  Pt is fully vaccinated  for Covid  AT [facility] PT STATES HE IS ON HOME 02 2.5-3 LITERS DAILY DUE TO EMPHYSEMA AND HAS BEEN FOR MANY YEARS , SINCE 2016-   LAST COLON 2018 IN THE [facility] PT WAS ON 02 BUT NURSE DOCUMENTED NO HOME O2- TE DR PERRY- OV NEEDED PER DR PERRY- I CANCELED [facility] COLON 6-14 AND OV 7-25 AT 240 PM - I WENT OVER BLANK [facility] INSTRUCTIONS TODAY IN CASE PT GETS SCHEDULED AT [place]   Due to the COVID-19 pandemic we are asking patients to follow certain guidelines.  Pt aware of COVID protocols and [facility] guidelines

## 2020-09-08 NOTE — Telephone Encounter (Signed)
DR Henrene Pastor,  I AM SEEING THIS PT IN [facility]- HE INFORMED ME HE USES 02 AT HOME, 2.5-3 LITERS AND HE USES THIS DAY AND NIGHT- AND USES OFTEN DUE TO EMPHYSEMA- HE HAD A COLON HERE IN 2018, HE STATES HE WAS ON HOME 02 AT THIS TIME AS WELL BUT IT IS DOCUMENTED NO HOME 02-  DO YOU WANT HIM TO HAVE AN OV OR DIRECT AT [facility]?  PLEASE ADVISE AND THANKS FOR YOUR TIME, MARIE PV

## 2020-09-08 NOTE — Telephone Encounter (Signed)
OV 7-25 DR PERRY 240 PM  [facility] COLON CANCELED

## 2020-09-15 ENCOUNTER — Other Ambulatory Visit: Payer: Self-pay

## 2020-09-15 ENCOUNTER — Ambulatory Visit (INDEPENDENT_AMBULATORY_CARE_PROVIDER_SITE_OTHER): Payer: Medicare PPO | Admitting: Internal Medicine

## 2020-09-15 ENCOUNTER — Other Ambulatory Visit: Payer: Self-pay | Admitting: Internal Medicine

## 2020-09-15 ENCOUNTER — Ambulatory Visit (INDEPENDENT_AMBULATORY_CARE_PROVIDER_SITE_OTHER): Payer: Medicare PPO

## 2020-09-15 ENCOUNTER — Encounter: Payer: Self-pay | Admitting: Internal Medicine

## 2020-09-15 VITALS — BP 144/68 | HR 97 | Temp 98.4°F | Resp 20 | Ht 67.5 in | Wt 130.0 lb

## 2020-09-15 DIAGNOSIS — J441 Chronic obstructive pulmonary disease with (acute) exacerbation: Secondary | ICD-10-CM | POA: Diagnosis not present

## 2020-09-15 DIAGNOSIS — R0602 Shortness of breath: Secondary | ICD-10-CM | POA: Diagnosis not present

## 2020-09-15 DIAGNOSIS — R509 Fever, unspecified: Secondary | ICD-10-CM | POA: Diagnosis not present

## 2020-09-15 DIAGNOSIS — R059 Cough, unspecified: Secondary | ICD-10-CM | POA: Diagnosis not present

## 2020-09-15 DIAGNOSIS — J439 Emphysema, unspecified: Secondary | ICD-10-CM | POA: Diagnosis not present

## 2020-09-15 MED ORDER — HYDROCODONE BIT-HOMATROP MBR 5-1.5 MG/5ML PO SOLN: 5.0000 mL | Freq: Three times a day (TID) | ORAL | 0 refills | Status: DC | PRN

## 2020-09-15 MED ORDER — PREDNISONE 50 MG PO TABS: 50.0000 mg | ORAL_TABLET | Freq: Every day | ORAL | 0 refills | Status: DC

## 2020-09-15 NOTE — Progress Notes (Signed)
Subjective:  Patient ID: Joshua Cross, male    DOB: May 13, 1945  Age: 75 y.o. MRN: [id number]  CC: Cough and COPD  This visit occurred during the SARS-CoV-2 public health emergency.  Safety protocols were in place, including screening questions prior to the visit, additional usage of staff PPE, and extensive cleaning of exam room while observing appropriate contact time as indicated for disinfecting solutions.    HPI [name] presents for f/up -  He complains of a 1 day history of nonproductive cough, chills, and shortness of breath.  He did an at home COVID test and says it was negative.  He denies chest pain, hemoptysis, fever, night sweats, or wheezing.  Outpatient Medications Prior to Visit  Medication Sig Dispense Refill   Acetylcarnitine HCl (ACETYL L-CARNITINE PO) Take by mouth. 750 MG DAILY     albuterol (PROVENTIL HFA;VENTOLIN HFA) 108 (90 Base) MCG/ACT inhaler INHALE 2 PUFFS INTO THE LUNGS EVERY 6 HOURS AS NEEDED FOR WHEEZING OR SHORTNESS OF BREATH 18 g 3   ASHWAGANDHA PO Take by mouth. 3000 MCG DAILY     Biotin 1 MG CAPS Take by mouth.     budesonide (PULMICORT) 0.5 MG/2ML nebulizer solution INHALE CONTENTS OF 1 VIAL VIA NEBULIZER TWICE DAILY 120 mL 11   cholecalciferol (VITAMIN D) 1000 units tablet Take 2,000 Units by mouth daily.     Coenzyme Q10 (CO Q 10 PO) Take by mouth.     DALIRESP 500 MCG TABS tablet TAKE 1 TABLET BY MOUTH DAILY 90 tablet 1   formoterol (PERFOROMIST) 20 MCG/2ML nebulizer solution Take 2 mLs (20 mcg total) by nebulization 2 (two) times daily. 360 mL 1   ipratropium (ATROVENT) 0.03 % nasal spray INSTILL 2 SPRAYS INTO EACH NOSTRIL EVERY 12 HOURS 30 mL 5   LONHALA MAGNAIR REFILL KIT 25 MCG/ML SOLN INHALE 1 VIAL(1 ML) INTO THE LUNGS, USING MAGNAIR NEBULIZER DEVICE, TWICE DAILY 60 mL 11   magnesium gluconate (MAGONATE) 500 MG tablet Take 500 mg by mouth daily.     Melatonin 10 MG CAPS Take 10 mg by mouth daily.      Multiple Vitamin  (MULTIVITAMIN) tablet Take 1 tablet by mouth daily. CENTRUM SILVER DAILY     OVER THE COUNTER MEDICATION MUSCADINE SEED 2400 MCG 2 DAILY     OVER THE COUNTER MEDICATION HERBAL HAIR SUPPLEMENT  1 X DAILY     PARoxetine (PAXIL) 20 MG tablet TAKE 1 TABLET(20 MG) BY MOUTH DAILY 90 tablet 1   Probiotic Product (PROBIOTIC ADVANCED PO) Take 1 capsule by mouth daily. From deep roots     rosuvastatin (CRESTOR) 10 MG tablet TAKE 1 TABLET(10 MG) BY MOUTH DAILY 90 tablet 1   Sod Fluoride-Potassium Nitrate (PREVIDENT 5000 SENSITIVE) 1.1-5 % GEL PreviDent 5000 Enamel Protect 1.1 %-5 % dental paste     TART CHERRY PO Take by mouth. 2400 MCG DAILY     tiZANidine (ZANAFLEX) 2 MG tablet TAKE 1 TABLET(2 MG) BY MOUTH EVERY 8 HOURS AS NEEDED FOR MUSCLE SPASMS 90 tablet 0   No facility-administered medications prior to visit.    ROS Review of Systems  Constitutional:  Positive for chills. Negative for fatigue and unexpected weight change.  HENT: Negative.    Respiratory:  Positive for cough and shortness of breath. Negative for chest tightness and wheezing.   Cardiovascular:  Negative for chest pain, palpitations and leg swelling.  Gastrointestinal:  Negative for abdominal pain, constipation, diarrhea, nausea and vomiting.  Genitourinary: Negative.  Negative  for difficulty urinating.  Musculoskeletal:  Positive for myalgias. Negative for arthralgias.  Neurological: Negative.  Negative for dizziness, weakness and light-headedness.  Hematological:  Negative for adenopathy. Does not bruise/bleed easily.  Psychiatric/Behavioral: Negative.     Objective:  BP (!) 144/68 (BP Location: Left Arm, Patient Position: Sitting, Cuff Size: Normal)   Pulse 97   Temp 98.4 F (36.9 C) (Oral)   Resp 20   Ht 5' 7.5" (1.715 m)   Wt 130 lb (59 kg)   SpO2 96%   BMI 20.06 kg/m   BP Readings from Last 3 Encounters:  09/15/20 (!) 144/68  12/10/19 90/62  11/25/19 122/70    Wt Readings from Last 3 Encounters:  09/15/20  130 lb (59 kg)  09/08/20 128 lb (58.1 kg)  12/10/19 124 lb 3.2 oz (56.3 kg)    Physical Exam Vitals reviewed.  Constitutional:      General: He is not in acute distress.    Appearance: He is not ill-appearing, toxic-appearing or diaphoretic.     Comments: Pulse ox 96% on 3L  HENT:     Nose: Nose normal.     Mouth/Throat:     Mouth: Mucous membranes are moist.  Eyes:     General: No scleral icterus.    Conjunctiva/sclera: Conjunctivae normal.  Cardiovascular:     Rate and Rhythm: Normal rate and regular rhythm.     Heart sounds: No murmur heard. Pulmonary:     Effort: Accessory muscle usage present. No tachypnea or respiratory distress.     Breath sounds: Examination of the right-upper field reveals decreased breath sounds. Examination of the left-upper field reveals decreased breath sounds. Examination of the right-middle field reveals decreased breath sounds. Examination of the left-middle field reveals decreased breath sounds. Examination of the right-lower field reveals decreased breath sounds. Examination of the left-lower field reveals decreased breath sounds. Decreased breath sounds present. No wheezing, rhonchi or rales.  Abdominal:     General: Abdomen is flat.     Palpations: There is no mass.     Tenderness: There is no abdominal tenderness. There is no guarding.  Musculoskeletal:        General: Normal range of motion.     Cervical back: Neck supple.     Right lower leg: No edema.     Left lower leg: No edema.  Lymphadenopathy:     Cervical: No cervical adenopathy.  Skin:    General: Skin is warm and dry.     Coloration: Skin is not pale.  Neurological:     General: No focal deficit present.     Mental Status: He is alert.  Psychiatric:        Mood and Affect: Mood normal.        Behavior: Behavior normal.    Lab Results  Component Value Date   WBC 8.1 11/13/2019   HGB 15.0 11/13/2019   HCT 44.4 11/13/2019   PLT 347 11/13/2019   GLUCOSE 98 11/13/2019    CHOL 178 11/13/2019   TRIG 89 11/13/2019   HDL 85 11/13/2019   LDLCALC 76 11/13/2019   ALT 24 11/13/2019   AST 23 11/13/2019   NA 138 11/13/2019   K 4.4 11/13/2019   CL 103 11/13/2019   CREATININE 0.84 11/13/2019   BUN 12 11/13/2019   CO2 28 11/13/2019   TSH 2.89 11/13/2019   PSA 0.6 11/13/2019   HGBA1C 5.9 (H) 11/13/2019   MICROALBUR 0.2 11/13/2019    DG Chest 2 View  Result Date: 05/15/2018 CLINICAL DATA:  Chronic dyspnea; worsening x 2 mo. Hx of COPD, emphysema, right lung sx. Ex smoker. EXAM: CHEST - 2 VIEW COMPARISON:  04/30/2017 FINDINGS: Lungs are hyperinflated. Marked emphysematous changes. There is perihilar peribronchial thickening, stable and chronic. There are no new consolidations or pleural effusions. No pulmonary edema. Surgical clips are identified in the MEDIAL aspect of the RIGHT lung apex. Remote rib fractures. IMPRESSION: Chronic, marked hyperinflation/emphysematous changes of the lungs. The superimposed infectious process. Electronically Signed   By: Nolon Nations M.D.   On: 05/15/2018 13:54   DG Chest 2 View  Result Date: 09/15/2020 CLINICAL DATA:  Cough, fever, shortness of breath.  COVID negative. EXAM: CHEST - 2 VIEW COMPARISON:  Radiograph 05/15/2018. FINDINGS: Advanced emphysema. Chronic hyperinflation. Chain sutures are noted in the right suprahilar lung. No acute or focal airspace disease. The heart is normal in size. Normal mediastinal contours. No pleural effusion or pneumothorax. No pulmonary edema. Mild thoracic spondylosis. No acute osseous abnormalities are seen. IMPRESSION: 1. Advanced emphysema. 2. No superimposed acute abnormality. Electronically Signed   By: Keith Rake M.D.   On: 09/15/2020 16:24      Assessment & Plan:   Lawson was seen today for cough and copd.  Diagnoses and all orders for this visit:  COPD exacerbation ([place])- His new chest x-ray is negative for mass or infiltrate.  This is likely a viral URI with exacerbation of COPD.   I recommended a 5-day course of prednisone and a narcotic cough suppressant. -     predniSONE (DELTASONE) 50 MG tablet; Take 1 tablet (50 mg total) by mouth daily with breakfast. -     HYDROcodone bit-homatropine (HYCODAN) 5-1.5 MG/5ML syrup; Take 5 mLs by mouth every 8 (eight) hours as needed for cough.  I am having [name] "Jim" start on predniSONE and HYDROcodone bit-homatropine. I am also having him maintain his Probiotic Product (PROBIOTIC ADVANCED PO), magnesium gluconate, Melatonin, cholecalciferol, ipratropium, albuterol, Biotin, tiZANidine, Daliresp, Lonhala Magnair Refill Kit, formoterol, rosuvastatin, budesonide, PARoxetine, Acetylcarnitine HCl (ACETYL L-CARNITINE PO), multivitamin, Coenzyme Q10 (CO Q 10 PO), ASHWAGANDHA PO, OVER THE COUNTER MEDICATION, OVER THE COUNTER MEDICATION, TART CHERRY PO, and PreviDent 5000 Sensitive.  Meds ordered this encounter  Medications   predniSONE (DELTASONE) 50 MG tablet    Sig: Take 1 tablet (50 mg total) by mouth daily with breakfast.    Dispense:  5 tablet    Refill:  0   HYDROcodone bit-homatropine (HYCODAN) 5-1.5 MG/5ML syrup    Sig: Take 5 mLs by mouth every 8 (eight) hours as needed for cough.    Dispense:  120 mL    Refill:  0     Follow-up: No follow-ups on file.  Scarlette Calico, MD

## 2020-09-17 ENCOUNTER — Encounter: Payer: Self-pay | Admitting: Internal Medicine

## 2020-09-17 NOTE — Patient Instructions (Signed)

## 2020-09-22 ENCOUNTER — Encounter: Payer: Medicare PPO | Admitting: Internal Medicine

## 2020-10-08 ENCOUNTER — Other Ambulatory Visit: Payer: Self-pay | Admitting: Internal Medicine

## 2020-10-08 DIAGNOSIS — J418 Mixed simple and mucopurulent chronic bronchitis: Secondary | ICD-10-CM

## 2020-10-18 ENCOUNTER — Other Ambulatory Visit: Payer: Self-pay | Admitting: Internal Medicine

## 2020-10-18 DIAGNOSIS — J9611 Chronic respiratory failure with hypoxia: Secondary | ICD-10-CM

## 2020-10-18 DIAGNOSIS — J418 Mixed simple and mucopurulent chronic bronchitis: Secondary | ICD-10-CM

## 2020-10-18 MED ORDER — FORMOTEROL FUMARATE 20 MCG/2ML IN NEBU: 20.0000 ug | INHALATION_SOLUTION | Freq: Two times a day (BID) | RESPIRATORY_TRACT | 1 refills | Status: DC

## 2020-11-02 ENCOUNTER — Encounter: Payer: Self-pay | Admitting: Internal Medicine

## 2020-11-02 ENCOUNTER — Ambulatory Visit: Payer: Medicare PPO | Admitting: Internal Medicine

## 2020-11-02 VITALS — BP 112/60 | HR 65 | Ht 66.0 in | Wt 130.0 lb

## 2020-11-02 DIAGNOSIS — Z9981 Dependence on supplemental oxygen: Secondary | ICD-10-CM | POA: Diagnosis not present

## 2020-11-02 DIAGNOSIS — Z86010 Personal history of colonic polyps: Secondary | ICD-10-CM | POA: Diagnosis not present

## 2020-11-02 MED ORDER — NA SULFATE-K SULFATE-MG SULF 17.5-3.13-1.6 GM/177ML PO SOLN: 1.0000 | Freq: Once | ORAL | 0 refills | Status: AC

## 2020-11-02 NOTE — Patient Instructions (Signed)
If you are age 76 or older, your body mass index should be between 23-30. Your Body mass index is 20.98 kg/m. If this is out of the aforementioned range listed, please consider follow up with your Primary Care Provider. _________________________________________________________  The [facility] providers would like to encourage you to use [facility] to communicate with providers for non-urgent requests or questions.  Due to long hold times on the telephone, sending your provider a message by [facility] may be a faster and more efficient way to get a response.  Please allow 48 business hours for a response.  Please remember that this is for non-urgent requests.   You have been scheduled for a colonoscopy. Please follow written instructions given to you at your visit today.  Please pick up your prep supplies at the pharmacy within the next 1-3 days. If you use inhalers (even only as needed), please bring them with you on the day of your procedure.  Due to recent changes in healthcare laws, you may see the results of your imaging and laboratory studies on MyChart before your provider has had a chance to review them.  We understand that in some cases there may be results that are confusing or concerning to you. Not all laboratory results come back in the same time frame and the provider may be waiting for multiple results in order to interpret others.  Please give [place] 48 hours in order for your provider to thoroughly review all the results before contacting the office for clarification of your results.   Thank you for entrusting me with your care and choosing [facility].  Dr Henrene Pastor

## 2020-11-02 NOTE — H&P (View-Only) (Signed)
HISTORY OF PRESENT ILLNESS:  Joshua Cross is a 75 y.o. male with multiple medical problems as listed below including COPD for which she is on home oxygen at night.  Patient has a history of nonadvanced adenoma elsewhere in 2013.  Poor preparation elsewhere 2016.  Underwent colonoscopy here October 2018.  Required extensive prep.  Found to have an adenoma as well as a tubulovillous adenoma.  Follow-up in 3 years recommended.  Patient presents today regarding his surveillance examination.  He tells me that he has been stable from a pulmonary standpoint.  He still uses oxygen at night.  He does have tendencies toward constipation.  No diabetes.  No blood thinners.  REVIEW OF SYSTEMS:  All non-GI ROS negative as otherwise stated in the HPI.  Past Medical History:  Diagnosis Date   Allergy    MILD    Anxiety    MILD    Arthritis    Blood transfusion without reported diagnosis    Cataract    REMOVED BOTH EYES    Colon polyp    COPD (chronic obstructive pulmonary disease) ([place])    Emphysema of lung (HCC)    Hyperlipidemia    Oxygen deficiency    2.5 -3 LITERS - USES DAY AND NIGHT AND USES OFTEN     Past Surgical History:  Procedure Laterality Date   cataracts Bilateral 2019   COLONOSCOPY     HERNIA REPAIR     LUNG REMOVAL, PARTIAL Right 1985   POLYPECTOMY     TONSILLECTOMY      Social History Joshua Cross  reports that he quit smoking about 16 years ago. His smoking use included cigarettes. He has a 12.50 pack-year smoking history. He has never used smokeless tobacco. He reports that he does not drink alcohol and does not use drugs.  family history includes Alcohol abuse in his mother; Depression in his sister; Heart disease in his sister; Stroke in his father.  Allergies  Allergen Reactions   Erythromycin     REACTION: GI Intolerance   Penicillins     Has patient had a PCN reaction causing immediate rash, facial/tongue/throat swelling, SOB or lightheadedness with  hypotension: Unknown Has patient had a PCN reaction causing severe rash involving mucus membranes or skin necrosis: Unknown Has patient had a PCN reaction that required hospitalization: Unknown Has patient had a PCN reaction occurring within the last 10 years: Unknown If all of the above answers are "NO", then may proceed with Cephalosporin use.        PHYSICAL EXAMINATION: Vital signs: BP 112/60   Pulse 65   Ht '5\' 6"'$  (1.676 m)   Wt 130 lb (59 kg)   SpO2 93%   BMI 20.98 kg/m   Constitutional: generally well-appearing, no acute distress Psychiatric: alert and oriented x3, cooperative Eyes: extraocular movements intact, anicteric, conjunctiva pink Mouth: oral pharynx moist, no lesions Neck: supple no lymphadenopathy Cardiovascular: heart regular rate and rhythm, no murmur Lungs: clear to auscultation bilaterally Abdomen: soft, nontender, nondistended, no obvious ascites, no peritoneal signs, normal bowel sounds, no organomegaly Rectal: Deferred until colonoscopy Extremities: no clubbing, cyanosis, or lower extremity edema bilaterally Skin: no lesions on visible extremities Neuro: No focal deficits.  Cranial nerves intact  ASSESSMENT:  1.  History of adenomatous and tubulovillous adenomas.  Last colonoscopy October 2018.  Follow-up in 3 years recommended.  Appropriate candidate without contraindication.  Higher than baseline risk due to lung disease 2.  COPD.  On home oxygen at night 3.  Difficulty with colonic preparations   PLAN:  1.  Colonoscopy.  Patient is HIGH RISK given his lung disease.  The examination will be performed at the hospital with monitored anesthesia care.  Patient will also require more extensive colonic preparation.  2 days of clear liquids, magnesium citrate, and standard split prep.The nature of the procedure, as well as the risks, benefits, and alternatives were carefully and thoroughly reviewed with the patient. Ample time for discussion and questions  allowed. The patient understood, was satisfied, and agreed to proceed.

## 2020-11-02 NOTE — Progress Notes (Signed)
HISTORY OF PRESENT ILLNESS:  Joshua Cross is a 75 y.o. male with multiple medical problems as listed below including COPD for which she is on home oxygen at night.  Patient has a history of nonadvanced adenoma elsewhere in 2013.  Poor preparation elsewhere 2016.  Underwent colonoscopy here October 2018.  Required extensive prep.  Found to have an adenoma as well as a tubulovillous adenoma.  Follow-up in 3 years recommended.  Patient presents today regarding his surveillance examination.  He tells me that he has been stable from a pulmonary standpoint.  He still uses oxygen at night.  He does have tendencies toward constipation.  No diabetes.  No blood thinners.  REVIEW OF SYSTEMS:  All non-GI ROS negative as otherwise stated in the HPI.  Past Medical History:  Diagnosis Date   Allergy    MILD    Anxiety    MILD    Arthritis    Blood transfusion without reported diagnosis    Cataract    REMOVED BOTH EYES    Colon polyp    COPD (chronic obstructive pulmonary disease) ([place])    Emphysema of lung (HCC)    Hyperlipidemia    Oxygen deficiency    2.5 -3 LITERS - USES DAY AND NIGHT AND USES OFTEN     Past Surgical History:  Procedure Laterality Date   cataracts Bilateral 2019   COLONOSCOPY     HERNIA REPAIR     LUNG REMOVAL, PARTIAL Right 1985   POLYPECTOMY     TONSILLECTOMY      Social History Joshua Cross  reports that he quit smoking about 16 years ago. His smoking use included cigarettes. He has a 12.50 pack-year smoking history. He has never used smokeless tobacco. He reports that he does not drink alcohol and does not use drugs.  family history includes Alcohol abuse in his mother; Depression in his sister; Heart disease in his sister; Stroke in his father.  Allergies  Allergen Reactions   Erythromycin     REACTION: GI Intolerance   Penicillins     Has patient had a PCN reaction causing immediate rash, facial/tongue/throat swelling, SOB or lightheadedness with  hypotension: Unknown Has patient had a PCN reaction causing severe rash involving mucus membranes or skin necrosis: Unknown Has patient had a PCN reaction that required hospitalization: Unknown Has patient had a PCN reaction occurring within the last 10 years: Unknown If all of the above answers are "NO", then may proceed with Cephalosporin use.        PHYSICAL EXAMINATION: Vital signs: BP 112/60   Pulse 65   Ht '5\' 6"'$  (1.676 m)   Wt 130 lb (59 kg)   SpO2 93%   BMI 20.98 kg/m   Constitutional: generally well-appearing, no acute distress Psychiatric: alert and oriented x3, cooperative Eyes: extraocular movements intact, anicteric, conjunctiva pink Mouth: oral pharynx moist, no lesions Neck: supple no lymphadenopathy Cardiovascular: heart regular rate and rhythm, no murmur Lungs: clear to auscultation bilaterally Abdomen: soft, nontender, nondistended, no obvious ascites, no peritoneal signs, normal bowel sounds, no organomegaly Rectal: Deferred until colonoscopy Extremities: no clubbing, cyanosis, or lower extremity edema bilaterally Skin: no lesions on visible extremities Neuro: No focal deficits.  Cranial nerves intact  ASSESSMENT:  1.  History of adenomatous and tubulovillous adenomas.  Last colonoscopy October 2018.  Follow-up in 3 years recommended.  Appropriate candidate without contraindication.  Higher than baseline risk due to lung disease 2.  COPD.  On home oxygen at night 3.  Difficulty with colonic preparations   PLAN:  1.  Colonoscopy.  Patient is HIGH RISK given his lung disease.  The examination will be performed at the hospital with monitored anesthesia care.  Patient will also require more extensive colonic preparation.  2 days of clear liquids, magnesium citrate, and standard split prep.The nature of the procedure, as well as the risks, benefits, and alternatives were carefully and thoroughly reviewed with the patient. Ample time for discussion and questions  allowed. The patient understood, was satisfied, and agreed to proceed.

## 2020-11-05 ENCOUNTER — Telehealth: Payer: Self-pay | Admitting: Internal Medicine

## 2020-11-05 NOTE — Telephone Encounter (Signed)
Dr.Perry,  I spoke with this patient. He is very upset because he went to 4 different stores looking for the Magnesium Citrate and he was told it is "recalled". He would like to know what you would like for him to do for the colonoscopy. Looks like he is doing a 2 day prep also with Miralax and dulcolax and suprep. Please advise. Thank you, Makaelah Cranfield pv

## 2020-11-05 NOTE — Telephone Encounter (Signed)
Spoke with the pt's pharmacy, patient had already picked up [place] and he was told the magnesium citrate "[facility]" name brand is on recall but he can go get that from another store.   I called the patient, no answer. Left a message from him to go to another store to buy the mag. Citrate.

## 2020-11-05 NOTE — Telephone Encounter (Signed)
1.  Dulcolax and half MiraLAX prep in a.m. the day prior to his procedure 2.  This should be followed later that day by standard Suprep (split prep)

## 2020-11-05 NOTE — Telephone Encounter (Signed)
I gave patient Dr.Perry's recommendations. No further questions from the patient at this time.

## 2020-11-12 ENCOUNTER — Ambulatory Visit (HOSPITAL_COMMUNITY): Payer: Medicare PPO | Admitting: Registered Nurse

## 2020-11-12 ENCOUNTER — Other Ambulatory Visit: Payer: Self-pay

## 2020-11-12 ENCOUNTER — Encounter (HOSPITAL_COMMUNITY)
Admission: RE | Disposition: A | Discharge status: Home / Self Care | Payer: Self-pay | Source: Home / Self Care | Attending: Internal Medicine

## 2020-11-12 ENCOUNTER — Encounter (HOSPITAL_COMMUNITY): Payer: Self-pay | Admitting: Internal Medicine

## 2020-11-12 ENCOUNTER — Ambulatory Visit (HOSPITAL_COMMUNITY)
Admission: RE | Admit: 2020-11-12 | Discharge: 2020-11-12 | Disposition: A | Discharge status: Home / Self Care | Payer: Medicare PPO | Attending: Internal Medicine | Admitting: Internal Medicine

## 2020-11-12 ENCOUNTER — Other Ambulatory Visit: Payer: Self-pay | Admitting: Internal Medicine

## 2020-11-12 DIAGNOSIS — Z87891 Personal history of nicotine dependence: Secondary | ICD-10-CM | POA: Insufficient documentation

## 2020-11-12 DIAGNOSIS — D124 Benign neoplasm of descending colon: Secondary | ICD-10-CM | POA: Diagnosis not present

## 2020-11-12 DIAGNOSIS — Z881 Allergy status to other antibiotic agents status: Secondary | ICD-10-CM | POA: Diagnosis not present

## 2020-11-12 DIAGNOSIS — Z860100 Personal history of colon polyps, unspecified: Secondary | ICD-10-CM

## 2020-11-12 DIAGNOSIS — E7800 Pure hypercholesterolemia, unspecified: Secondary | ICD-10-CM

## 2020-11-12 DIAGNOSIS — Z1211 Encounter for screening for malignant neoplasm of colon: Secondary | ICD-10-CM | POA: Insufficient documentation

## 2020-11-12 DIAGNOSIS — K635 Polyp of colon: Secondary | ICD-10-CM | POA: Diagnosis not present

## 2020-11-12 DIAGNOSIS — J439 Emphysema, unspecified: Secondary | ICD-10-CM | POA: Diagnosis not present

## 2020-11-12 DIAGNOSIS — Z9981 Dependence on supplemental oxygen: Secondary | ICD-10-CM | POA: Insufficient documentation

## 2020-11-12 DIAGNOSIS — J441 Chronic obstructive pulmonary disease with (acute) exacerbation: Secondary | ICD-10-CM | POA: Diagnosis not present

## 2020-11-12 DIAGNOSIS — E785 Hyperlipidemia, unspecified: Secondary | ICD-10-CM | POA: Diagnosis not present

## 2020-11-12 DIAGNOSIS — Z880 Allergy status to penicillin: Secondary | ICD-10-CM | POA: Insufficient documentation

## 2020-11-12 DIAGNOSIS — K5900 Constipation, unspecified: Secondary | ICD-10-CM | POA: Diagnosis not present

## 2020-11-12 HISTORY — PX: COLONOSCOPY WITH PROPOFOL: SHX5780

## 2020-11-12 HISTORY — PX: POLYPECTOMY: SHX5525

## 2020-11-12 SURGERY — COLONOSCOPY WITH PROPOFOL
Anesthesia: Monitor Anesthesia Care

## 2020-11-12 MED ORDER — LACTATED RINGERS IV SOLN: INTRAVENOUS | Status: DC

## 2020-11-12 MED ORDER — PROPOFOL 500 MG/50ML IV EMUL
INTRAVENOUS | Status: DC | PRN
  Administered 2020-11-12: 200 ug/kg/min via INTRAVENOUS

## 2020-11-12 MED ORDER — SODIUM CHLORIDE 0.9 % IV SOLN: INTRAVENOUS | Status: DC

## 2020-11-12 SURGICAL SUPPLY — 22 items

## 2020-11-12 NOTE — Anesthesia Postprocedure Evaluation (Signed)
Anesthesia Post Note  Patient: THATCHER DOBERSTEIN  Procedure(s) Performed: COLONOSCOPY WITH PROPOFOL POLYPECTOMY     Patient location during evaluation: Endoscopy Anesthesia Type: MAC Level of consciousness: awake and alert, patient cooperative and oriented Pain management: pain level controlled Vital Signs Assessment: post-procedure vital signs reviewed and stable Respiratory status: nonlabored ventilation, respiratory function stable and spontaneous breathing Cardiovascular status: stable and blood pressure returned to baseline Postop Assessment: no apparent nausea or vomiting, adequate PO intake and able to ambulate Anesthetic complications: no   No notable events documented.  Last Vitals:  Vitals:   11/12/20 1049 11/12/20 1100  BP: (!) 148/54 129/82  Pulse: 83 64  Resp: (!) 21 (!) 23  Temp:    SpO2: 100% 95%    Last Pain:  Vitals:   11/12/20 1100  TempSrc:   PainSc: 0-No pain                 Dewana Ammirati,E. Rusty Glodowski

## 2020-11-12 NOTE — Interval H&P Note (Signed)
History and Physical Interval Note:  11/12/2020 10:05 AM  Joshua Cross  has presented today for surgery, with the diagnosis of hx of colon polyps.  The various methods of treatment have been discussed with the patient and family. After consideration of risks, benefits and other options for treatment, the patient has consented to  Procedure(s): COLONOSCOPY WITH PROPOFOL (N/A) as a surgical intervention.  The patient's history has been reviewed, patient examined, no change in status, stable for surgery.  I have reviewed the patient's chart and labs.  Questions were answered to the patient's satisfaction.   I saw the patient in the admissions area preprocedure.  He did well with his prep.  No interval problems since his office evaluation.  He is ready for colonoscopy.  All questions answered.   Scarlette Shorts

## 2020-11-12 NOTE — Transfer of Care (Signed)
Immediate Anesthesia Transfer of Care Note  Patient: Joshua Cross  Procedure(s) Performed: COLONOSCOPY WITH PROPOFOL POLYPECTOMY  Patient Location: PACU  Anesthesia Type:MAC  Level of Consciousness: awake, alert , oriented and patient cooperative  Airway & Oxygen Therapy: Patient Spontanous Breathing and Patient connected to face mask oxygen  Post-op Assessment: Report given to RN, Post -op Vital signs reviewed and stable and Patient moving all extremities X 4  Post vital signs: stable  Last Vitals:  Vitals Value Taken Time  BP    Temp    Pulse 74 11/12/20 1038  Resp 12 11/12/20 1038  SpO2 100 % 11/12/20 1038    Last Pain:  Vitals:   11/12/20 0823  TempSrc: Oral  PainSc: 0-No pain         Complications: No notable events documented.

## 2020-11-12 NOTE — Anesthesia Preprocedure Evaluation (Addendum)
Anesthesia Evaluation  Patient identified by MRN, date of birth, ID band Patient awake    Reviewed: Allergy & Precautions, NPO status , Patient's Chart, lab work & pertinent test results  History of Anesthesia Complications Negative for: history of anesthetic complications  Airway Mallampati: I  TM Distance: >3 FB Neck ROM: Full    Dental  (+) Dental Advisory Given, Caps, Missing   Pulmonary COPD,  COPD inhaler and oxygen dependent, former smoker,    breath sounds clear to auscultation       Cardiovascular negative cardio ROS   Rhythm:Regular Rate:Normal     Neuro/Psych Anxiety negative neurological ROS     GI/Hepatic negative GI ROS, Neg liver ROS,   Endo/Other  negative endocrine ROS  Renal/GU negative Renal ROS     Musculoskeletal  (+) Arthritis ,   Abdominal   Peds  Hematology negative hematology ROS (+)   Anesthesia Other Findings   Reproductive/Obstetrics                            Anesthesia Physical Anesthesia Plan  ASA: 3  Anesthesia Plan: MAC   Post-op Pain Management:    Induction:   PONV Risk Score and Plan: 1 and Ondansetron and Treatment may vary due to age or medical condition  Airway Management Planned: Natural Airway and Simple Face Mask  Additional Equipment: None  Intra-op Plan:   Post-operative Plan:   Informed Consent: I have reviewed the patients History and Physical, chart, labs and discussed the procedure including the risks, benefits and alternatives for the proposed anesthesia with the patient or authorized representative who has indicated his/her understanding and acceptance.     Dental advisory given  Plan Discussed with: CRNA and Surgeon  Anesthesia Plan Comments:        Anesthesia Quick Evaluation

## 2020-11-12 NOTE — Op Note (Signed)
[facility] Patient Name: Joshua Cross Procedure Date: 11/12/2020 MRN: [id number] Attending MD: Docia Chuck. Henrene Pastor , MD Date of Birth: 30-Jun-1945 CSN: [id number] Age: 75 Admit Type: Outpatient Procedure:                Colonoscopy with cold snare polypectomy x 1 Indications:              High risk colon cancer surveillance: Personal                            history of adenoma with villous component, High                            risk colon cancer surveillance: Personal history of                            multiple (3 or more) adenomas. Previous                            examinations 2013, 2016, 2018 Providers:                Docia Chuck. Henrene Pastor, MD, Brooke Person, Tyna Jaksch                            Technician Referring MD:             Scarlette Calico, MD primary care provider Medicines:                Monitored Anesthesia Care Complications:            No immediate complications. Estimated blood loss:                            None. Estimated Blood Loss:     Estimated blood loss: none. Procedure:                Pre-Anesthesia Assessment:                           - Prior to the procedure, a History and Physical                            was performed, and patient medications and                            allergies were reviewed. The patient's tolerance of                            previous anesthesia was also reviewed. The risks                            and benefits of the procedure and the sedation                            options and risks were discussed with the patient.  All questions were answered, and informed consent                            was obtained. Prior Anticoagulants: The patient has                            taken no previous anticoagulant or antiplatelet                            agents. ASA Grade Assessment: III - A patient with                            severe systemic disease. After reviewing the risks                             and benefits, the patient was deemed in                            satisfactory condition to undergo the procedure.                           After obtaining informed consent, the colonoscope                            was passed under direct vision. Throughout the                            procedure, the patient's blood pressure, pulse, and                            oxygen saturations were monitored continuously. The                            [id number]) Olympus colonoscope was                            introduced through the anus and advanced to the the                            cecum, identified by appendiceal orifice and                            ileocecal valve. The ileocecal valve, appendiceal                            orifice, and rectum were photographed. The quality                            of the bowel preparation was excellent. The                            colonoscopy was performed without difficulty. The  patient tolerated the procedure well. The bowel                            preparation used was extensive with magnesium                            citrate followed by SUPREP via split dose                            instruction. Scope In: 10:14:59 AM Scope Out: 10:32:33 AM Scope Withdrawal Time: 0 hours 11 minutes 39 seconds  Total Procedure Duration: 0 hours 17 minutes 34 seconds  Findings:      A 4 mm polyp was found in the descending colon. The polyp was sessile.       The polyp was removed with a cold snare. Resection and retrieval were       complete.      The exam was otherwise without abnormality on direct and retroflexion       views. Impression:               - One 4 mm polyp in the descending colon, removed                            with a cold snare. Resected and retrieved.                           - The examination was otherwise normal on direct                            and retroflexion  views. Moderate Sedation:      none Recommendation:           - Repeat colonoscopy is not recommended for                            surveillance.                           - Patient has a contact number available for                            emergencies. The signs and symptoms of potential                            delayed complications were discussed with the                            patient. Return to normal activities tomorrow.                            Written discharge instructions were provided to the                            patient.                           - Resume previous  diet.                           - Continue present medications.                           - Await pathology results. Procedure Code(s):        --- Professional ---                           [phone], Colonoscopy, flexible; with removal of                            tumor(s), polyp(s), or other lesion(s) by snare                            technique Diagnosis Code(s):        --- Professional ---                           K63.5, Polyp of colon                           Z86.010, Personal history of colonic polyps CPT copyright 2019 [employer]. All rights reserved. The codes documented in this report are preliminary and upon coder review may  be revised to meet current compliance requirements. Docia Chuck. Henrene Pastor, MD 11/12/2020 10:41:09 AM This report has been signed electronically. Number of Addenda: 0

## 2020-11-12 NOTE — Discharge Instructions (Signed)

## 2020-11-12 NOTE — Anesthesia Procedure Notes (Signed)
Procedure Name: MAC Date/Time: 11/12/2020 10:03 AM Performed by: Lissa Morales, CRNA Pre-anesthesia Checklist: Patient identified, Emergency Drugs available, Suction available and Patient being monitored Patient Re-evaluated:Patient Re-evaluated prior to induction Oxygen Delivery Method: Simple face mask Placement Confirmation: positive ETCO2

## 2020-11-13 ENCOUNTER — Encounter (HOSPITAL_COMMUNITY): Payer: Self-pay | Admitting: Internal Medicine

## 2020-11-13 LAB — SURGICAL PATHOLOGY

## 2021-01-07 DIAGNOSIS — H401131 Primary open-angle glaucoma, bilateral, mild stage: Secondary | ICD-10-CM | POA: Diagnosis not present

## 2021-01-21 DIAGNOSIS — Z85828 Personal history of other malignant neoplasm of skin: Secondary | ICD-10-CM | POA: Diagnosis not present

## 2021-01-21 DIAGNOSIS — L570 Actinic keratosis: Secondary | ICD-10-CM | POA: Diagnosis not present

## 2021-01-21 DIAGNOSIS — L814 Other melanin hyperpigmentation: Secondary | ICD-10-CM | POA: Diagnosis not present

## 2021-01-21 DIAGNOSIS — D225 Melanocytic nevi of trunk: Secondary | ICD-10-CM | POA: Diagnosis not present

## 2021-02-22 ENCOUNTER — Ambulatory Visit: Payer: Medicare PPO

## 2021-03-06 ENCOUNTER — Other Ambulatory Visit: Payer: Self-pay | Admitting: Internal Medicine

## 2021-03-06 DIAGNOSIS — F3342 Major depressive disorder, recurrent, in full remission: Secondary | ICD-10-CM

## 2021-03-08 ENCOUNTER — Other Ambulatory Visit: Payer: Self-pay | Admitting: Internal Medicine

## 2021-03-08 DIAGNOSIS — F3342 Major depressive disorder, recurrent, in full remission: Secondary | ICD-10-CM

## 2021-03-08 MED ORDER — PAROXETINE HCL 20 MG PO TABS: ORAL_TABLET | ORAL | 1 refills | Status: DC

## 2021-03-18 ENCOUNTER — Other Ambulatory Visit: Payer: Self-pay

## 2021-03-18 ENCOUNTER — Ambulatory Visit (INDEPENDENT_AMBULATORY_CARE_PROVIDER_SITE_OTHER): Payer: Medicare PPO

## 2021-03-18 DIAGNOSIS — Z0000 Encounter for general adult medical examination without abnormal findings: Secondary | ICD-10-CM | POA: Diagnosis not present

## 2021-03-18 NOTE — Progress Notes (Signed)
I connected with Joshua Cross today by telephone and verified that I am speaking with the correct person using two identifiers. Location patient: home Location provider: work Persons participating in the virtual visit: patient, provider.   I discussed the limitations, risks, security and privacy concerns of performing an evaluation and management service by telephone and the availability of in person appointments. I also discussed with the patient that there may be a patient responsible charge related to this service. The patient expressed understanding and verbally consented to this telephonic visit.    Interactive audio and video telecommunications were attempted between this provider and patient, however failed, due to patient having technical difficulties OR patient did not have access to video capability.  We continued and completed visit with audio only.  Some vital signs may be absent or patient reported.   Time Spent with patient on telephone encounter: 40 minutes  Subjective:   Joshua Cross is a 75 y.o. male who presents for Medicare Annual/Subsequent preventive examination.  Review of Systems     Cardiac Risk Factors include: advanced age (>44mn, >>31women);dyslipidemia;family history of premature cardiovascular disease;hypertension;male gender     Objective:    There were no vitals filed for this visit. There is no height or weight on file to calculate BMI.  Advanced Directives 03/18/2021 11/12/2020 01/05/2020 11/25/2019 11/21/2018 11/15/2017 04/30/2017  Does Patient Have a Medical Advance Directive? Yes Yes No Yes Yes No No  Type of Advance Directive - [place] will - [place] will [place] will - -  Does patient want to make changes to medical advance directive? No - Patient declined - - - - - -  Copy of [place]? - No - copy requested - Yes - validated most  recent copy scanned in chart (See row information) No - copy requested - -  Would patient like information on creating a medical advance directive? - - No - Patient declined - - Yes (ED - Information included in AVS) -    Current Medications (verified) Outpatient Encounter Medications as of 03/18/2021  Medication Sig   Acetylcarnitine HCl (ACETYL L-CARNITINE PO) Take 750 mg by mouth daily. 750 MG DAILY   ASHWAGANDHA PO Take 3,000 mcg by mouth daily. 3000 MCG DAILY   Biotin 1 MG CAPS Take 1 mg by mouth daily.   budesonide (PULMICORT) 0.5 MG/2ML nebulizer solution INHALE CONTENTS OF 1 VIAL VIA NEBULIZER TWICE DAILY (Patient taking differently: Take 0.5 mg by nebulization in the morning and at bedtime.)   Cholecalciferol (VITAMIN D) 50 MCG (2000 UT) CAPS Take 2,000 Units by mouth daily.   Coenzyme Q10 (CO Q 10 PO) Take 1 capsule by mouth daily.   DALIRESP 500 MCG TABS tablet TAKE 1 TABLET BY MOUTH DAILY (Patient taking differently: Take 500 mcg by mouth daily.)   formoterol (PERFOROMIST) 20 MCG/2ML nebulizer solution Take 2 mLs (20 mcg total) by nebulization 2 (two) times daily.   ipratropium (ATROVENT) 0.03 % nasal spray INSTILL 2 SPRAYS INTO EACH NOSTRIL EVERY 12 HOURS (Patient taking differently: Place 2 sprays into both nostrils 2 (two) times daily as needed for rhinitis.)   LONHALA MAGNAIR REFILL KIT 25 MCG/ML SOLN INHALE 1 VIAL(1 ML) INTO THE LUNGS, USING MAGNAIR NEBULIZER DEVICE, TWICE DAILY (Patient taking differently: Take 25 mcg by nebulization in the morning and at bedtime.)   Melatonin 10 MG CAPS Take 10 mg by mouth at bedtime.   Multiple Vitamin (MULTIVITAMIN)  tablet Take 1 tablet by mouth daily. CENTRUM SILVER DAILY   OVER THE COUNTER MEDICATION Take 2,400 mcg by mouth in the morning and at bedtime. MUSCADINE SEED 2400 MCG 2 DAILY   OVER THE COUNTER MEDICATION Take 1 capsule by mouth daily. HERBAL HAIR SUPPLEMENT  1 X DAILY   PARoxetine (PAXIL) 20 MG tablet TAKE 1 TABLET(20 MG) BY  MOUTH DAILY   Probiotic Product (PROBIOTIC ADVANCED PO) Take 1 capsule by mouth daily. From deep roots   rosuvastatin (CRESTOR) 10 MG tablet TAKE 1 TABLET(10 MG) BY MOUTH DAILY   Sod Fluoride-Potassium Nitrate (PREVIDENT 5000 SENSITIVE) 1.1-5 % GEL PreviDent 5000 Enamel Protect 1.1 %-5 % dental paste   TART CHERRY PO Take 2,400 mcg by mouth daily. 2400 MCG DAILY   No facility-administered encounter medications on file as of 03/18/2021.    Allergies (verified) Erythromycin and Penicillins   History: Past Medical History:  Diagnosis Date   Allergy    MILD    Anxiety    MILD    Arthritis    Blood transfusion without reported diagnosis    Cataract    REMOVED BOTH EYES    Colon polyp    COPD (chronic obstructive pulmonary disease) (HCC)    Emphysema of lung (HCC)    Hyperlipidemia    Oxygen deficiency    2.5 -3 LITERS - USES DAY AND NIGHT AND USES OFTEN    Past Surgical History:  Procedure Laterality Date   cataracts Bilateral 2019   COLONOSCOPY     COLONOSCOPY WITH PROPOFOL N/A 11/12/2020   Procedure: COLONOSCOPY WITH PROPOFOL;  Surgeon: Irene Shipper, MD;  Location: [facility];  Service: Endoscopy;  Laterality: N/A;   HERNIA REPAIR     LUNG REMOVAL, PARTIAL Right 1985   POLYPECTOMY     POLYPECTOMY  11/12/2020   Procedure: POLYPECTOMY;  Surgeon: Irene Shipper, MD;  Location: [facility];  Service: Endoscopy;;   TONSILLECTOMY     Family History  Problem Relation Age of Onset   Alcohol abuse Mother    Stroke Father    Depression Sister    Heart disease Sister    Cancer Neg Hx    COPD Neg Hx    Early death Neg Hx    Kidney disease Neg Hx    Hypertension Neg Hx    Learning disabilities Neg Hx    Colon cancer Neg Hx    Colon polyps Neg Hx    Esophageal cancer Neg Hx    Rectal cancer Neg Hx    Stomach cancer Neg Hx    Social History   Socioeconomic History   Marital status: Divorced    Spouse name: Not on file   Number of children: Not on file   Years of  education: Not on file   Highest education level: Not on file  Occupational History   Occupation: retired  Tobacco Use   Smoking status: Former    Packs/day: 0.25    Years: 50.00    Pack years: 12.50    Types: Cigarettes    Quit date: 08/06/2004    Years since quitting: 16.6   Smokeless tobacco: Never  Vaping Use   Vaping Use: Never used  Substance and Sexual Activity   Alcohol use: No   Drug use: No   Sexual activity: Not Currently  Other Topics Concern   Not on file  Social History Narrative   Patient is from [place], moved to [place] to attend school at [employer], 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 He is an avid reader and active in [place].   Social Determinants of Health   Financial Resource Strain: Low Risk    Difficulty of Paying Living Expenses: Not hard at all  Food Insecurity: No Food Insecurity   Worried About Charity fundraiser in the Last Year: Never true   [place] in the Last Year: Never true  Transportation Needs: Unknown   Lack of Transportation (Medical): No   Lack of Transportation (Non-Medical): Not on file  Physical Activity: Inactive   Days of Exercise per Week: 0 days   Minutes of Exercise per Session: 0 min  Stress: No Stress Concern Present   Feeling of Stress : Not at all  Social Connections: Moderately Integrated   Frequency of Communication with Friends and Family: More than three times a week   Frequency of Social Gatherings with Friends and Family: More than three times a week   Attends Religious Services: More than 4 times per year   Active Member of [employer] or Organizations: Yes   Attends Music therapist: More than 4 times per year   Marital Status: Never married    Tobacco Counseling Counseling given: Not Answered   Clinical Intake:  Pre-visit preparation completed: Yes  Pain : No/denies pain     Nutritional Risks:  None Diabetes: No  How often do you need to have someone help you when you read instructions, pamphlets, or other written materials from your doctor or pharmacy?: 1 - Never What is the last grade level you completed in school?: PhD  Diabetic? no  Interpreter Needed?: No  Information entered by :: Lisette Abu, LPN   Activities of Daily Living In your present state of health, do you have any difficulty performing the following activities: 03/18/2021  Hearing? N  Vision? N  Difficulty concentrating or making decisions? N  Walking or climbing stairs? N  Dressing or bathing? N  Doing errands, shopping? N  Preparing Food and eating ? N  Using the Toilet? N  In the past six months, have you accidently leaked urine? N  Do you have problems with loss of bowel control? N  Managing your Medications? N  Managing your Finances? N  Housekeeping or managing your Housekeeping? N  Some recent data might be hidden    Patient Care Team: Janith Lima, MD as PCP - General (Internal Medicine) Irene Shipper, MD as Consulting Physician (Gastroenterology) Danice Goltz, MD as Consulting Physician (Ophthalmology)  Indicate any recent Medical Services you may have received from other than [facility] providers in the past year (date may be approximate).     Assessment:   This is a routine wellness examination for Joshua Cross.  Hearing/Vision screen Hearing Screening - Comments:: Patient denied any hearing difficulty.   No hearing aids.  Vision Screening - Comments:: Patient wears corrective glasses/contacts.  Eye exam done annually by: Tama High  Dietary issues and exercise activities discussed: Current Exercise Habits: The patient does not participate in regular exercise at present, Exercise limited by: respiratory conditions(s)   Goals Addressed               This Visit's Progress     Patient Stated (pt-stated)        Patient declined health goal at this time.       Depression Screen PHQ 2/9 Scores 03/18/2021 11/25/2019 11/21/2018  11/15/2017  PHQ - 2 Score 0 0 0 0  PHQ- 9 Score - - - 0    Fall Risk Fall Risk  03/18/2021 11/25/2019 11/21/2018 11/15/2017  Falls in the past year? 0 0 0 No  Number falls in past yr: 0 0 0 -  Injury with Fall? 0 0 0 -  Risk for fall due to : No Fall Risks No Fall Risks History of fall(s) -  Follow up Falls prevention discussed Falls evaluation completed;Education provided - -    FALL RISK PREVENTION PERTAINING TO THE HOME:  Any stairs in or around the home? No  If so, are there any without handrails? No  Home free of loose throw rugs in walkways, pet beds, electrical cords, etc? Yes  Adequate lighting in your home to reduce risk of falls? Yes   ASSISTIVE DEVICES UTILIZED TO PREVENT FALLS:  Life alert? No  Use of a cane, walker or w/c? No  Grab bars in the bathroom? No  Shower chair or bench in shower? No  Elevated toilet seat or a handicapped toilet? No   TIMED UP AND GO:  Was the test performed? No .  Length of time to ambulate 10 feet: n/a sec.   Gait steady and fast without use of assistive device (per patient)  Cognitive Function: Normal cognitive status assessed by direct observation by this Nurse Health Advisor. No abnormalities found.       6CIT Screen 11/25/2019  What Year? 0 points  What month? 0 points  What time? 0 points  Count back from 20 0 points  Months in reverse 0 points  Repeat phrase 0 points  Total Score 0    Immunizations Immunization History  Administered Date(s) Administered   DTaP 03/07/2016   Influenza Split 02/01/2014, 12/31/2014, 01/24/2018   Influenza, High Dose Seasonal PF 12/06/2018   Influenza, Seasonal, Injecte, Preservative Fre 02/13/2010   Influenza-Unspecified 12/15/2016, 02/04/2018, 12/08/2018, 01/06/2021   PFIZER(Purple Top)SARS-COV-2 Vaccination 05/18/2019, 06/12/2019, 11/28/2019   Pfizer Covid-19 Vaccine Bivalent Booster 61yr & up 01/06/2021   Pneumococcal  Conjugate-13 07/27/2013   Pneumococcal Polysaccharide-23 02/12/2010   Pneumococcal-Unspecified 03/08/2015   Tdap 05/27/2015   Zoster, Live 03/08/2011    TDAP status: Up to date  Flu Vaccine status: Up to date  Pneumococcal vaccine status: Up to date  Covid-19 vaccine status: Completed vaccines  Qualifies for Shingles Vaccine? Yes   Zostavax completed Yes   Shingrix Completed?: No.    Education has been provided regarding the importance of this vaccine. Patient has been advised to call insurance company to determine out of pocket expense if they have not yet received this vaccine. Advised may also receive vaccine at local pharmacy or Health Dept. Verbalized acceptance and understanding.  Screening Tests Health Maintenance  Topic Date Due   Zoster Vaccines- Shingrix (1 of 2) Never done   Pneumonia Vaccine 76 Years old (3 - PPSV23 if available, else PCV20) 03/07/2016   OPHTHALMOLOGY EXAM  04/19/2019   HEMOGLOBIN A1C  05/15/2020   URINE MICROALBUMIN  11/12/2020   FOOT EXAM  11/13/2020   COLONOSCOPY (Pts 45-483yrInsurance coverage will need to be confirmed)  11/13/2023   TETANUS/TDAP  05/26/2025   INFLUENZA VACCINE  Completed   COVID-19 Vaccine  Completed   Hepatitis C Screening  Completed   HPV VACCINES  Aged Out    Health Maintenance  Health Maintenance Due  Topic Date Due   Zoster Vaccines- Shingrix (1 of 2) Never done   Pneumonia Vaccine 65+ Years  old (3 - PPSV23 if available, else PCV20) 03/07/2016   OPHTHALMOLOGY EXAM  04/19/2019   HEMOGLOBIN A1C  05/15/2020   URINE MICROALBUMIN  11/12/2020   FOOT EXAM  11/13/2020    Colorectal cancer screening: Type of screening: Colonoscopy. Completed 11/12/2020. Repeat every 3 years  Lung Cancer Screening: (Low Dose CT Chest recommended if Age 16-80 years, 30 pack-year currently smoking OR have quit w/in 15years.) does not qualify.   Lung Cancer Screening Referral: no  Additional Screening:  Hepatitis C Screening: does  qualify; Completed yes  Vision Screening: Recommended annual ophthalmology exams for early detection of glaucoma and other disorders of the eye. Is the patient up to date with their annual eye exam?  Yes  Who is the provider or what is the name of the office in which the patient attends annual eye exams? Tama High, MD. If pt is not established with a provider, would they like to be referred to a provider to establish care? No .   Dental Screening: Recommended annual dental exams for proper oral hygiene  Community Resource Referral / Chronic Care Management: CRR required this visit?  No   CCM required this visit?  No      Plan:     I have personally reviewed and noted the following in the patient's chart:   Medical and social history Use of alcohol, tobacco or illicit drugs  Current medications and supplements including opioid prescriptions. Patient is not currently taking opioid prescriptions. Functional ability and status Nutritional status Physical activity Advanced directives List of other physicians Hospitalizations, surgeries, and ER visits in previous 12 months Vitals Screenings to include cognitive, depression, and falls Referrals and appointments  In addition, I have reviewed and discussed with patient certain preventive protocols, quality metrics, and best practice recommendations. A written personalized care plan for preventive services as well as general preventive health recommendations were provided to patient.     Sheral Flow, LPN   [id number]   Nurse Notes:  Patient is cogitatively intact. There were no vitals filed for this visit. There is no height or weight on file to calculate BMI. Hearing Screening - Comments:: Patient denied any hearing difficulty.   No hearing aids.  Vision Screening - Comments:: Patient wears corrective glasses/contacts.  Eye exam done annually by: Tama High

## 2021-04-02 ENCOUNTER — Other Ambulatory Visit: Payer: Self-pay | Admitting: Internal Medicine

## 2021-04-02 DIAGNOSIS — J418 Mixed simple and mucopurulent chronic bronchitis: Secondary | ICD-10-CM

## 2021-04-05 ENCOUNTER — Other Ambulatory Visit: Payer: Self-pay | Admitting: Internal Medicine

## 2021-04-05 DIAGNOSIS — J418 Mixed simple and mucopurulent chronic bronchitis: Secondary | ICD-10-CM

## 2021-04-07 DIAGNOSIS — H401131 Primary open-angle glaucoma, bilateral, mild stage: Secondary | ICD-10-CM | POA: Diagnosis not present

## 2021-04-08 ENCOUNTER — Other Ambulatory Visit: Payer: Self-pay | Admitting: Internal Medicine

## 2021-04-08 DIAGNOSIS — W57XXXA Bitten or stung by nonvenomous insect and other nonvenomous arthropods, initial encounter: Secondary | ICD-10-CM | POA: Insufficient documentation

## 2021-04-08 MED ORDER — CLOBETASOL PROPIONATE 0.05 % EX OINT: 1.0000 "application " | TOPICAL_OINTMENT | Freq: Two times a day (BID) | CUTANEOUS | 1 refills | Status: DC

## 2021-04-08 MED ORDER — METHYLPREDNISOLONE 4 MG PO TBPK: ORAL_TABLET | ORAL | 0 refills | Status: AC

## 2021-04-14 ENCOUNTER — Telehealth: Payer: Self-pay | Admitting: Internal Medicine

## 2021-04-14 NOTE — Telephone Encounter (Signed)
Rep w/ [facility] requesting verbal authorization for rx [facility] REFILL KIT 25 MCG/ML SOLN through cover my meds   Rep states prior authorization expired 04-10-2021   [facility]  Phone [phone]

## 2021-04-16 NOTE — Telephone Encounter (Signed)
PA has been initiated.

## 2021-04-16 NOTE — Telephone Encounter (Signed)
Approved Coverage Ends on: 04/10/2022

## 2021-04-27 ENCOUNTER — Other Ambulatory Visit: Payer: Self-pay | Admitting: Internal Medicine

## 2021-04-27 DIAGNOSIS — J418 Mixed simple and mucopurulent chronic bronchitis: Secondary | ICD-10-CM

## 2021-04-28 ENCOUNTER — Other Ambulatory Visit: Payer: Self-pay | Admitting: Internal Medicine

## 2021-04-28 DIAGNOSIS — J418 Mixed simple and mucopurulent chronic bronchitis: Secondary | ICD-10-CM

## 2021-05-04 ENCOUNTER — Telehealth: Payer: Self-pay | Admitting: Internal Medicine

## 2021-05-04 ENCOUNTER — Other Ambulatory Visit: Payer: Self-pay | Admitting: Internal Medicine

## 2021-05-04 DIAGNOSIS — J418 Mixed simple and mucopurulent chronic bronchitis: Secondary | ICD-10-CM

## 2021-05-04 NOTE — Telephone Encounter (Signed)
Rep w/ [facility] specialty pharmacy making provider aware an e-script for a lanhala starter kit has been sent due patient's kit being broke

## 2021-05-14 DIAGNOSIS — Z961 Presence of intraocular lens: Secondary | ICD-10-CM | POA: Diagnosis not present

## 2021-05-14 DIAGNOSIS — H401131 Primary open-angle glaucoma, bilateral, mild stage: Secondary | ICD-10-CM | POA: Diagnosis not present

## 2021-05-17 ENCOUNTER — Other Ambulatory Visit: Payer: Self-pay | Admitting: Internal Medicine

## 2021-05-17 DIAGNOSIS — H401121 Primary open-angle glaucoma, left eye, mild stage: Secondary | ICD-10-CM | POA: Diagnosis not present

## 2021-05-17 DIAGNOSIS — E7800 Pure hypercholesterolemia, unspecified: Secondary | ICD-10-CM

## 2021-05-18 ENCOUNTER — Other Ambulatory Visit: Payer: Self-pay | Admitting: Internal Medicine

## 2021-05-18 DIAGNOSIS — J418 Mixed simple and mucopurulent chronic bronchitis: Secondary | ICD-10-CM

## 2021-05-18 MED ORDER — BUDESONIDE 0.5 MG/2ML IN SUSP: 0.5000 mg | Freq: Two times a day (BID) | RESPIRATORY_TRACT | 3 refills | Status: DC

## 2021-06-29 IMAGING — DX DG CHEST 2V
2 series · 2 of 2 positions shown · non-contrast
Comparison: Radiograph 05/15/2018.

CLINICAL DATA: Cough, fever, shortness of breath.  COVID negative.

EXAM:
CHEST - 2 VIEW

[chest lat]
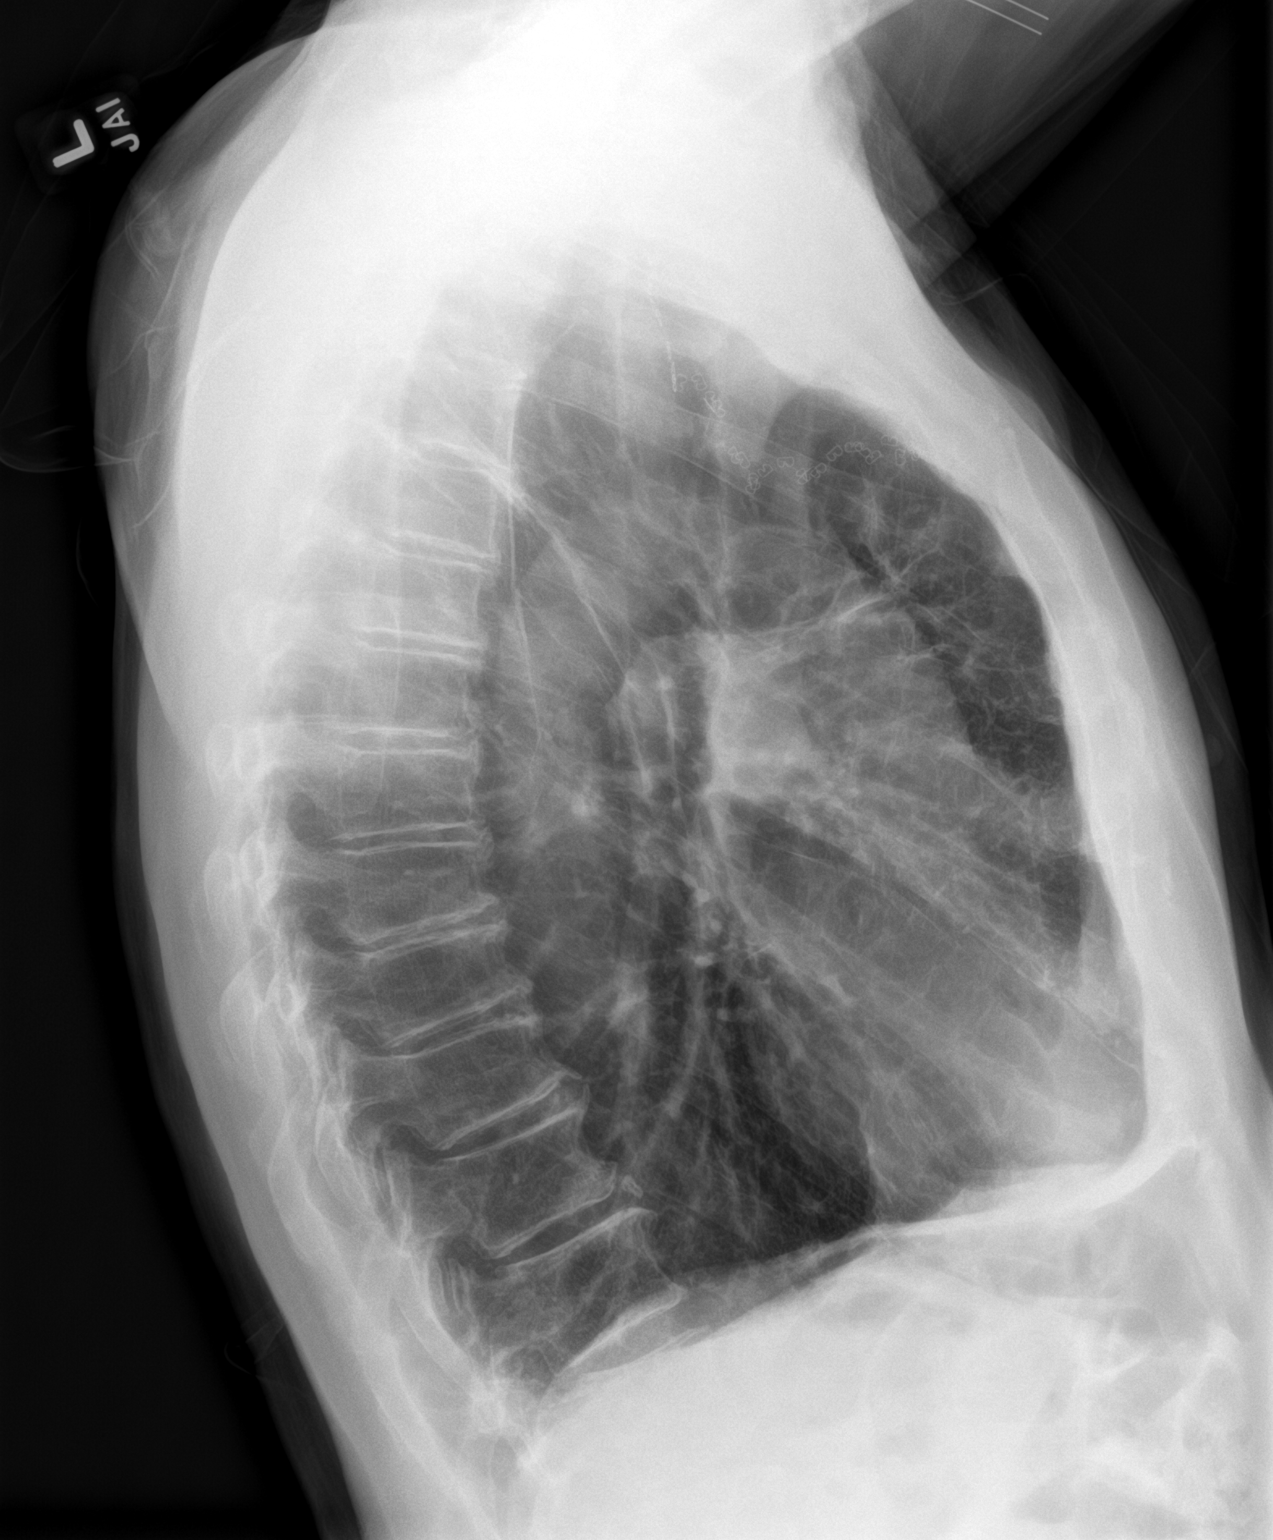

[chest pa]
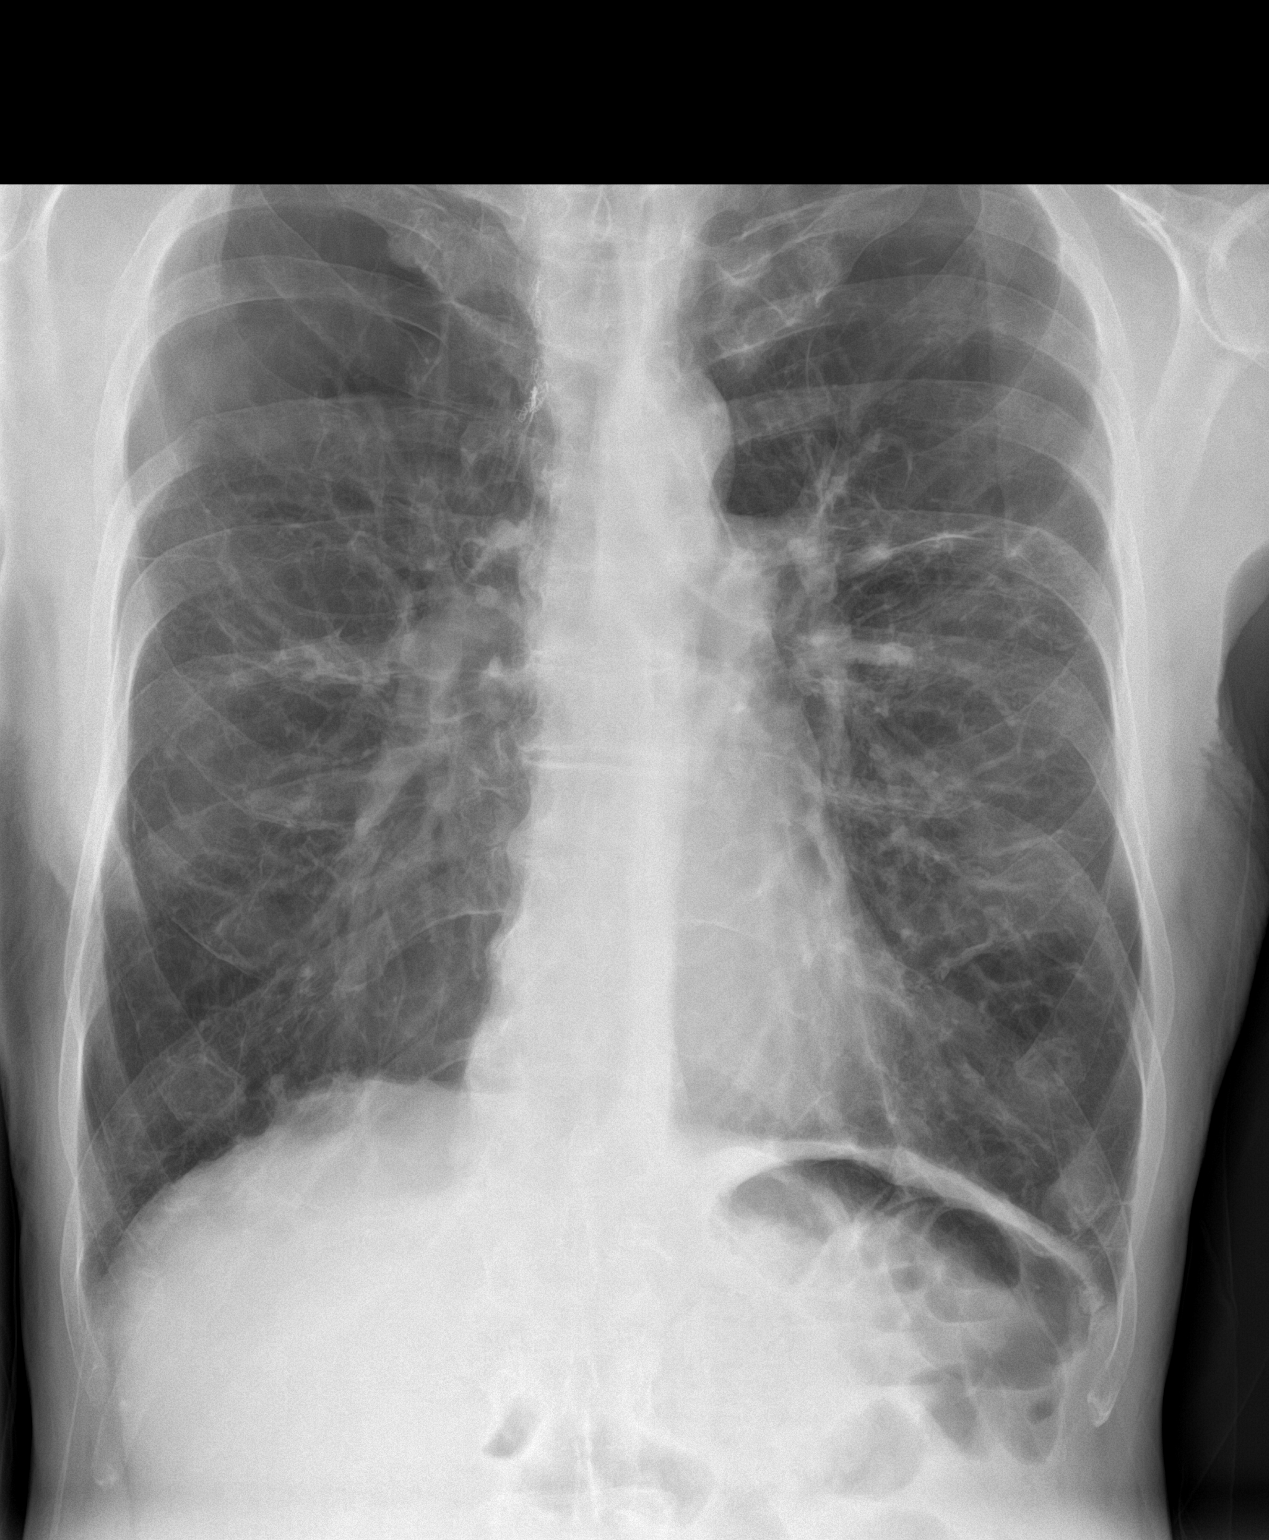

[2 of 2 positions shown; findings below may reference images not displayed]

FINDINGS: Advanced emphysema. Chronic hyperinflation. Chain sutures are noted
in the right suprahilar lung. No acute or focal airspace disease.
The heart is normal in size. Normal mediastinal contours. No pleural
effusion or pneumothorax. No pulmonary edema. Mild thoracic
spondylosis. No acute osseous abnormalities are seen.
IMPRESSION: 1. Advanced emphysema.
2. No superimposed acute abnormality.

## 2021-07-02 DIAGNOSIS — H401131 Primary open-angle glaucoma, bilateral, mild stage: Secondary | ICD-10-CM | POA: Diagnosis not present

## 2021-07-05 DIAGNOSIS — H401111 Primary open-angle glaucoma, right eye, mild stage: Secondary | ICD-10-CM | POA: Diagnosis not present

## 2021-07-12 ENCOUNTER — Other Ambulatory Visit: Payer: Self-pay | Admitting: Internal Medicine

## 2021-07-12 DIAGNOSIS — J418 Mixed simple and mucopurulent chronic bronchitis: Secondary | ICD-10-CM

## 2021-07-12 DIAGNOSIS — J9611 Chronic respiratory failure with hypoxia: Secondary | ICD-10-CM

## 2021-07-26 DIAGNOSIS — D1801 Hemangioma of skin and subcutaneous tissue: Secondary | ICD-10-CM | POA: Diagnosis not present

## 2021-07-26 DIAGNOSIS — D225 Melanocytic nevi of trunk: Secondary | ICD-10-CM | POA: Diagnosis not present

## 2021-07-26 DIAGNOSIS — Z85828 Personal history of other malignant neoplasm of skin: Secondary | ICD-10-CM | POA: Diagnosis not present

## 2021-07-26 DIAGNOSIS — L298 Other pruritus: Secondary | ICD-10-CM | POA: Diagnosis not present

## 2021-07-26 DIAGNOSIS — L821 Other seborrheic keratosis: Secondary | ICD-10-CM | POA: Diagnosis not present

## 2021-07-26 DIAGNOSIS — L570 Actinic keratosis: Secondary | ICD-10-CM | POA: Diagnosis not present

## 2021-07-26 DIAGNOSIS — D2262 Melanocytic nevi of left upper limb, including shoulder: Secondary | ICD-10-CM | POA: Diagnosis not present

## 2021-08-16 ENCOUNTER — Other Ambulatory Visit: Payer: Self-pay | Admitting: Internal Medicine

## 2021-08-16 DIAGNOSIS — E7800 Pure hypercholesterolemia, unspecified: Secondary | ICD-10-CM

## 2021-08-17 ENCOUNTER — Other Ambulatory Visit: Payer: Self-pay | Admitting: Internal Medicine

## 2021-08-17 DIAGNOSIS — J441 Chronic obstructive pulmonary disease with (acute) exacerbation: Secondary | ICD-10-CM | POA: Insufficient documentation

## 2021-08-17 MED ORDER — PREDNISONE 50 MG PO TABS: 50.0000 mg | ORAL_TABLET | Freq: Every day | ORAL | 0 refills | Status: DC

## 2021-08-17 MED ORDER — PROMETHAZINE HCL 12.5 MG PO TABS: 12.5000 mg | ORAL_TABLET | Freq: Four times a day (QID) | ORAL | 0 refills | Status: DC | PRN

## 2021-08-17 MED ORDER — PREDNISONE 50 MG PO TABS: 50.0000 mg | ORAL_TABLET | Freq: Every day | ORAL | 0 refills | Status: AC

## 2021-08-17 MED ORDER — AZITHROMYCIN 500 MG PO TABS
500.0000 mg | ORAL_TABLET | Freq: Every day | ORAL | 0 refills | Status: AC
Start: 2021-08-17 — End: 2021-08-20

## 2021-09-07 ENCOUNTER — Other Ambulatory Visit: Payer: Self-pay | Admitting: Internal Medicine

## 2021-09-07 DIAGNOSIS — F3342 Major depressive disorder, recurrent, in full remission: Secondary | ICD-10-CM

## 2021-09-17 ENCOUNTER — Other Ambulatory Visit: Payer: Self-pay | Admitting: Internal Medicine

## 2021-09-17 DIAGNOSIS — J418 Mixed simple and mucopurulent chronic bronchitis: Secondary | ICD-10-CM

## 2021-10-08 ENCOUNTER — Other Ambulatory Visit: Payer: Self-pay | Admitting: Internal Medicine

## 2021-10-08 DIAGNOSIS — J441 Chronic obstructive pulmonary disease with (acute) exacerbation: Secondary | ICD-10-CM

## 2021-10-08 MED ORDER — PREDNISONE 50 MG PO TABS: 50.0000 mg | ORAL_TABLET | Freq: Every day | ORAL | 0 refills | Status: DC

## 2021-11-13 ENCOUNTER — Other Ambulatory Visit: Payer: Self-pay | Admitting: Internal Medicine

## 2021-11-13 DIAGNOSIS — E7800 Pure hypercholesterolemia, unspecified: Secondary | ICD-10-CM

## 2021-11-16 ENCOUNTER — Encounter: Payer: Self-pay | Admitting: Internal Medicine

## 2021-11-16 ENCOUNTER — Ambulatory Visit: Payer: Medicare PPO | Admitting: Internal Medicine

## 2021-11-16 VITALS — BP 110/56 | HR 67 | Temp 97.9°F | Ht 66.0 in | Wt 127.2 lb

## 2021-11-16 DIAGNOSIS — E785 Hyperlipidemia, unspecified: Secondary | ICD-10-CM

## 2021-11-16 DIAGNOSIS — J418 Mixed simple and mucopurulent chronic bronchitis: Secondary | ICD-10-CM

## 2021-11-16 DIAGNOSIS — J441 Chronic obstructive pulmonary disease with (acute) exacerbation: Secondary | ICD-10-CM | POA: Diagnosis not present

## 2021-11-16 DIAGNOSIS — J9611 Chronic respiratory failure with hypoxia: Secondary | ICD-10-CM

## 2021-11-16 DIAGNOSIS — N400 Enlarged prostate without lower urinary tract symptoms: Secondary | ICD-10-CM | POA: Diagnosis not present

## 2021-11-16 DIAGNOSIS — Z0000 Encounter for general adult medical examination without abnormal findings: Secondary | ICD-10-CM | POA: Diagnosis not present

## 2021-11-16 DIAGNOSIS — B356 Tinea cruris: Secondary | ICD-10-CM | POA: Diagnosis not present

## 2021-11-16 DIAGNOSIS — Z23 Encounter for immunization: Secondary | ICD-10-CM

## 2021-11-16 DIAGNOSIS — L309 Dermatitis, unspecified: Secondary | ICD-10-CM | POA: Diagnosis not present

## 2021-11-16 MED ORDER — ROFLUMILAST 500 MCG PO TABS: 500.0000 ug | ORAL_TABLET | Freq: Every day | ORAL | 1 refills | Status: DC

## 2021-11-16 MED ORDER — TRIAMCINOLONE ACETONIDE 0.5 % EX CREA: 1.0000 | TOPICAL_CREAM | Freq: Two times a day (BID) | CUTANEOUS | 1 refills | Status: DC

## 2021-11-16 MED ORDER — PREDNISONE 50 MG PO TABS: 50.0000 mg | ORAL_TABLET | Freq: Every day | ORAL | 0 refills | Status: DC

## 2021-11-16 MED ORDER — ITRACONAZOLE 100 MG PO CAPS: 100.0000 mg | ORAL_CAPSULE | Freq: Every day | ORAL | 0 refills | Status: AC

## 2021-11-16 NOTE — Progress Notes (Unsigned)
Subjective:  Patient ID: Joshua Cross, male    DOB: 1945/05/22  Age: 76 y.o. MRN: [id number]  CC: Annual Exam, Rash, Hyperlipidemia, and COPD   HPI FERDINANDO LODGE presents for a CPX and f/up -  He complains of a 2 week history of itchy rash around the anus and perineum.  He complains of nonproductive cough, more difficulty breathing, and shortness of breath.  He denies chest pain, fever, chills, or night sweats.  Outpatient Medications Prior to Visit  Medication Sig Dispense Refill   Acetylcarnitine HCl (ACETYL L-CARNITINE PO) Take 750 mg by mouth daily. 750 MG DAILY     ASHWAGANDHA PO Take 3,000 mcg by mouth daily. 3000 MCG DAILY     Biotin 1 MG CAPS Take 1 mg by mouth daily.     budesonide (PULMICORT) 0.5 MG/2ML nebulizer solution INHALE 2 ML(1 VIAL) USING NEBULIZER IN THE MORNING AND AT BEDTIME 120 mL 1   Cholecalciferol (VITAMIN D) 50 MCG (2000 UT) CAPS Take 2,000 Units by mouth daily.     clobetasol ointment (TEMOVATE) 0.05 % Apply 1 application topically 2 (two) times daily. 45 g 1   Coenzyme Q10 (CO Q 10 PO) Take 1 capsule by mouth daily.     formoterol (PERFOROMIST) 20 MCG/2ML nebulizer solution INHALE 1 VIAL(2 ML) USING NEBULIZER TWICE DAILY 360 mL 1   ipratropium (ATROVENT) 0.03 % nasal spray INSTILL 2 SPRAYS INTO EACH NOSTRIL EVERY 12 HOURS (Patient taking differently: Place 2 sprays into both nostrils 2 (two) times daily as needed for rhinitis.) 30 mL 5   [place] MAGNAIR STARTER KIT 25 MCG/ML SOLN INHALE 1 VIAL(1ML) USING MAGNAIR NEBULIZER DEVICE TWICE DAILY AS DIRECTED 60 mL 5   Melatonin 10 MG CAPS Take 10 mg by mouth at bedtime.     Multiple Vitamin (MULTIVITAMIN) tablet Take 1 tablet by mouth daily. CENTRUM SILVER DAILY     OVER THE COUNTER MEDICATION Take 2,400 mcg by mouth in the morning and at bedtime. MUSCADINE SEED 2400 MCG 2 DAILY     PARoxetine (PAXIL) 20 MG tablet TAKE ONE TABLET BY MOUTH ONCE DAILY 90 tablet 1   Probiotic Product (PROBIOTIC ADVANCED  PO) Take 1 capsule by mouth daily. From deep roots     promethazine (PHENERGAN) 12.5 MG tablet Take 1 tablet (12.5 mg total) by mouth every 6 (six) hours as needed (cough). 30 tablet 0   rosuvastatin (CRESTOR) 10 MG tablet TAKE ONE TABLET EACH DAY 90 tablet 0   Sod Fluoride-Potassium Nitrate (PREVIDENT 5000 SENSITIVE) 1.1-5 % GEL PreviDent 5000 Enamel Protect 1.1 %-5 % dental paste     TART CHERRY PO Take 2,400 mcg by mouth daily. 2400 MCG DAILY     DALIRESP 500 MCG TABS tablet TAKE ONE TABLET EACH DAY 90 tablet 1   predniSONE (DELTASONE) 50 MG tablet Take 1 tablet (50 mg total) by mouth daily with breakfast. 5 tablet 0   No facility-administered medications prior to visit.    ROS Review of Systems  Constitutional: Negative.  Negative for diaphoresis and fatigue.  HENT: Negative.  Negative for sore throat.   Respiratory:  Positive for cough, shortness of breath and wheezing. Negative for choking, chest tightness and stridor.   Cardiovascular:  Negative for chest pain, palpitations and leg swelling.  Gastrointestinal:  Negative for abdominal pain, diarrhea, nausea and vomiting.  Genitourinary: Negative.  Negative for difficulty urinating.  Musculoskeletal: Negative.  Negative for arthralgias and myalgias.  Skin:  Positive for rash.  Neurological: Negative.  Negative for dizziness, weakness and light-headedness.  Hematological:  Negative for adenopathy. Does not bruise/bleed easily.  Psychiatric/Behavioral: Negative.      Objective:  BP (!) 110/56 (BP Location: Left Arm, Patient Position: Sitting, Cuff Size: Small)   Pulse 67   Temp 97.9 F (36.6 C) (Oral)   Ht $R'5\' 6"'eZ$  (1.676 m)   Wt 127 lb 4 oz (57.7 kg)   SpO2 (!) 84%   BMI 20.54 kg/m   BP Readings from Last 3 Encounters:  11/16/21 (!) 110/56  11/12/20 129/82  11/02/20 112/60    Wt Readings from Last 3 Encounters:  11/16/21 127 lb 4 oz (57.7 kg)  11/12/20 130 lb (59 kg)  11/02/20 130 lb (59 kg)    Physical Exam Vitals  reviewed. Exam conducted with a chaperone present [facility] Nurse).  Constitutional:      Appearance: He is ill-appearing.  HENT:     Nose: Nose normal.     Mouth/Throat:     Mouth: Mucous membranes are moist.  Eyes:     General: No scleral icterus.    Conjunctiva/sclera: Conjunctivae normal.  Cardiovascular:     Rate and Rhythm: Normal rate and regular rhythm.     Heart sounds: No murmur heard. Pulmonary:     Effort: Tachypnea and accessory muscle usage present. No prolonged expiration or respiratory distress.     Breath sounds: No stridor. Examination of the right-upper field reveals decreased breath sounds. Examination of the left-upper field reveals decreased breath sounds. Examination of the right-middle field reveals decreased breath sounds. Examination of the left-middle field reveals decreased breath sounds. Examination of the right-lower field reveals decreased breath sounds. Examination of the left-lower field reveals decreased breath sounds. Decreased breath sounds present. No wheezing, rhonchi or rales.  Chest:     Chest wall: No tenderness.  Abdominal:     General: Abdomen is flat.     Palpations: There is no mass.     Tenderness: There is no abdominal tenderness. There is no guarding.     Hernia: No hernia is present.  Genitourinary:    Rectum: Normal. Guaiac result negative. No mass, tenderness, anal fissure, external hemorrhoid or internal hemorrhoid. Normal anal tone.    Musculoskeletal:        General: Normal range of motion.     Cervical back: Neck supple.     Right lower leg: No edema.     Left lower leg: No edema.  Lymphadenopathy:     Cervical: No cervical adenopathy.  Skin:    General: Skin is dry.     Findings: Rash present.  Neurological:     General: No focal deficit present.     Lab Results  Component Value Date   WBC 8.1 11/13/2019   HGB 15.0 11/13/2019   HCT 44.4 11/13/2019   PLT 347 11/13/2019   GLUCOSE 98 11/13/2019   CHOL 178 11/13/2019    TRIG 89 11/13/2019   HDL 85 11/13/2019   LDLCALC 76 11/13/2019   ALT 24 11/13/2019   AST 23 11/13/2019   NA 138 11/13/2019   K 4.4 11/13/2019   CL 103 11/13/2019   CREATININE 0.84 11/13/2019   BUN 12 11/13/2019   CO2 28 11/13/2019   TSH 2.89 11/13/2019   PSA 0.6 11/13/2019   HGBA1C 5.9 (H) 11/13/2019   MICROALBUR 0.2 11/13/2019    No results found.  Assessment & Plan:   Montarius was seen today for annual exam, rash, hyperlipidemia and copd.  Diagnoses and all  orders for this visit:  Mixed simple and mucopurulent chronic bronchitis ([place])- Will continue the current regimen. -     Basic metabolic panel; Future -     CBC with Differential/Platelet; Future -     roflumilast (DALIRESP) 500 MCG TABS tablet; Take 1 tablet (500 mcg total) by mouth daily.  Chronic respiratory failure with hypoxia ([place])- Will continue the oxygen at 3 L/min. -     Basic metabolic panel; Future -     CBC with Differential/Platelet; Future  Benign prostatic hyperplasia without lower urinary tract symptoms- His PSA is normal. -     Basic metabolic panel; Future -     PSA; Future  Hyperlipidemia with target LDL less than 130- LDL goal achieved. Doing well on the statin  -     Basic metabolic panel; Future -     Lipid panel; Future -     Hepatic function panel; Future -     TSH; Future  Routine general medical examination at a health care facility-Exam completed, labs reviewed, vaccines reviewed and updated, cancer screenings are up-to-date, patient education was given.  Tinea of perianal region -     itraconazole (SPORANOX) 100 MG capsule; Take 1 capsule (100 mg total) by mouth daily for 7 days.  Perianal dermatitis -     triamcinolone cream (KENALOG) 0.5 %; Apply 1 Application topically 2 (two) times daily.  COPD with acute exacerbation (HCC) -     Discontinue: predniSONE (DELTASONE) 50 MG tablet; Take 1 tablet (50 mg total) by mouth daily with breakfast. -     predniSONE (DELTASONE) 50 MG tablet;  Take 1 tablet (50 mg total) by mouth daily with breakfast.   I have changed [name] "Jim"'s Daliresp to roflumilast. I am also having him start on itraconazole, triamcinolone cream, and Shingrix. Additionally, I am having him maintain his Probiotic Product (PROBIOTIC ADVANCED PO), Melatonin, Vitamin D, ipratropium, Biotin, Acetylcarnitine HCl (ACETYL L-CARNITINE PO), multivitamin, Coenzyme Q10 (CO Q 10 PO), ASHWAGANDHA PO, OVER THE COUNTER MEDICATION, TART CHERRY PO, PreviDent 5000 Sensitive, clobetasol ointment, [place] Magnair Starter Kit, formoterol, promethazine, PARoxetine, budesonide, rosuvastatin, and predniSONE.  Meds ordered this encounter  Medications   itraconazole (SPORANOX) 100 MG capsule    Sig: Take 1 capsule (100 mg total) by mouth daily for 7 days.    Dispense:  7 capsule    Refill:  0   triamcinolone cream (KENALOG) 0.5 %    Sig: Apply 1 Application topically 2 (two) times daily.    Dispense:  30 g    Refill:  1   roflumilast (DALIRESP) 500 MCG TABS tablet    Sig: Take 1 tablet (500 mcg total) by mouth daily.    Dispense:  90 tablet    Refill:  1   DISCONTD: predniSONE (DELTASONE) 50 MG tablet    Sig: Take 1 tablet (50 mg total) by mouth daily with breakfast.    Dispense:  5 tablet    Refill:  0   predniSONE (DELTASONE) 50 MG tablet    Sig: Take 1 tablet (50 mg total) by mouth daily with breakfast.    Dispense:  5 tablet    Refill:  0   Zoster Vaccine Adjuvanted [facility]) injection    Sig: Inject 0.5 mLs into the muscle once for 1 dose.    Dispense:  0.5 mL    Refill:  1     Follow-up: Return in about 6 months (around 05/19/2022).  Scarlette Calico, MD

## 2021-11-16 NOTE — Patient Instructions (Signed)
Health Maintenance, Male Adopting a healthy lifestyle and getting preventive care are important in promoting health and wellness. Ask your health care provider about: The right schedule for you to have regular tests and exams. Things you can do on your own to prevent diseases and keep yourself healthy. What should I know about diet, weight, and exercise? Eat a healthy diet  Eat a diet that includes plenty of vegetables, fruits, low-fat dairy products, and lean protein. Do not eat a lot of foods that are high in solid fats, added sugars, or sodium. Maintain a healthy weight Body mass index (BMI) is a measurement that can be used to identify possible weight problems. It estimates body fat based on height and weight. Your health care provider can help determine your BMI and help you achieve or maintain a healthy weight. Get regular exercise Get regular exercise. This is one of the most important things you can do for your health. Most adults should: Exercise for at least 150 minutes each week. The exercise should increase your heart rate and make you sweat (moderate-intensity exercise). Do strengthening exercises at least twice a week. This is in addition to the moderate-intensity exercise. Spend less time sitting. Even light physical activity can be beneficial. Watch cholesterol and blood lipids Have your blood tested for lipids and cholesterol at 76 years of age, then have this test every 5 years. You may need to have your cholesterol levels checked more often if: Your lipid or cholesterol levels are high. You are older than 77 years of age. You are at high risk for heart disease. What should I know about cancer screening? Many types of cancers can be detected early and may often be prevented. Depending on your health history and family history, you may need to have cancer screening at various ages. This may include screening for: Colorectal cancer. Prostate cancer. Skin cancer. Lung  cancer. What should I know about heart disease, diabetes, and high blood pressure? Blood pressure and heart disease High blood pressure causes heart disease and increases the risk of stroke. This is more likely to develop in people who have high blood pressure readings or are overweight. Talk with your health care provider about your target blood pressure readings. Have your blood pressure checked: Every 3-5 years if you are 18-39 years of age. Every year if you are 40 years old or older. If you are between the ages of 65 and 75 and are a current or former smoker, ask your health care provider if you should have a one-time screening for abdominal aortic aneurysm (AAA). Diabetes Have regular diabetes screenings. This checks your fasting blood sugar level. Have the screening done: Once every three years after age 45 if you are at a normal weight and have a low risk for diabetes. More often and at a younger age if you are overweight or have a high risk for diabetes. What should I know about preventing infection? Hepatitis B If you have a higher risk for hepatitis B, you should be screened for this virus. Talk with your health care provider to find out if you are at risk for hepatitis B infection. Hepatitis C Blood testing is recommended for: Everyone born from 1945 through 1965. Anyone with known risk factors for hepatitis C. Sexually transmitted infections (STIs) You should be screened each year for STIs, including gonorrhea and chlamydia, if: You are sexually active and are younger than 77 years of age. You are older than 77 years of age and your   health care provider tells you that you are at risk for this type of infection. Your sexual activity has changed since you were last screened, and you are at increased risk for chlamydia or gonorrhea. Ask your health care provider if you are at risk. Ask your health care provider about whether you are at high risk for HIV. Your health care provider  may recommend a prescription medicine to help prevent HIV infection. If you choose to take medicine to prevent HIV, you should first get tested for HIV. You should then be tested every 3 months for as long as you are taking the medicine. Follow these instructions at home: Alcohol use Do not drink alcohol if your health care provider tells you not to drink. If you drink alcohol: Limit how much you have to 0-2 drinks a day. Know how much alcohol is in your drink. In the [place] one drink equals one 12 oz bottle of beer (355 mL), one 5 oz glass of wine (148 mL), or one 1 oz glass of hard liquor (44 mL). Lifestyle Do not use any products that contain nicotine or tobacco. These products include cigarettes, chewing tobacco, and vaping devices, such as e-cigarettes. If you need help quitting, ask your health care provider. Do not use street drugs. Do not share needles. Ask your health care provider for help if you need support or information about quitting drugs. General instructions Schedule regular health, dental, and eye exams. Stay current with your vaccines. Tell your health care provider if: You often feel depressed. You have ever been abused or do not feel safe at home. Summary Adopting a healthy lifestyle and getting preventive care are important in promoting health and wellness. Follow your health care provider's instructions about healthy diet, exercising, and getting tested or screened for diseases. Follow your health care provider's instructions on monitoring your cholesterol and blood pressure. This information is not intended to replace advice given to you by your health care provider. Make sure you discuss any questions you have with your health care provider. Document Revised: 08/17/2020 Document Reviewed: 08/17/2020 Elsevier Patient Education  2023 Elsevier Inc.  

## 2021-11-17 MED ORDER — PREDNISONE 50 MG PO TABS: 50.0000 mg | ORAL_TABLET | Freq: Every day | ORAL | 0 refills | Status: DC

## 2021-11-19 MED ORDER — SHINGRIX 50 MCG/0.5ML IM SUSR: 0.5000 mL | Freq: Once | INTRAMUSCULAR | 1 refills | Status: AC

## 2021-11-22 ENCOUNTER — Other Ambulatory Visit: Payer: Self-pay | Admitting: Internal Medicine

## 2021-11-22 DIAGNOSIS — J418 Mixed simple and mucopurulent chronic bronchitis: Secondary | ICD-10-CM

## 2021-11-22 MED ORDER — REVEFENACIN 175 MCG/3ML IN SOLN: 175.0000 ug | Freq: Every day | RESPIRATORY_TRACT | 1 refills | Status: DC

## 2021-11-24 ENCOUNTER — Other Ambulatory Visit (INDEPENDENT_AMBULATORY_CARE_PROVIDER_SITE_OTHER): Payer: Medicare PPO

## 2021-11-24 DIAGNOSIS — N400 Enlarged prostate without lower urinary tract symptoms: Secondary | ICD-10-CM

## 2021-11-24 DIAGNOSIS — E785 Hyperlipidemia, unspecified: Secondary | ICD-10-CM

## 2021-11-24 DIAGNOSIS — J418 Mixed simple and mucopurulent chronic bronchitis: Secondary | ICD-10-CM | POA: Diagnosis not present

## 2021-11-24 DIAGNOSIS — J9611 Chronic respiratory failure with hypoxia: Secondary | ICD-10-CM

## 2021-11-24 LAB — CBC WITH DIFFERENTIAL/PLATELET
Basophils Absolute: 0 10*3/uL (ref 0.0–0.1)
Basophils Relative: 0.1 % (ref 0.0–3.0)
Eosinophils Absolute: 0.1 10*3/uL (ref 0.0–0.7)
Eosinophils Relative: 0.8 % (ref 0.0–5.0)
HCT: 45.1 % (ref 39.0–52.0)
Hemoglobin: 15.1 g/dL (ref 13.0–17.0)
Lymphocytes Relative: 26.9 % (ref 12.0–46.0)
Lymphs Abs: 3.1 10*3/uL (ref 0.7–4.0)
MCHC: 33.4 g/dL (ref 30.0–36.0)
MCV: 92.6 fl (ref 78.0–100.0)
Monocytes Absolute: 0.8 10*3/uL (ref 0.1–1.0)
Monocytes Relative: 6.5 % (ref 3.0–12.0)
Neutro Abs: 7.6 10*3/uL (ref 1.4–7.7)
Neutrophils Relative %: 65.7 % (ref 43.0–77.0)
Platelets: 374 10*3/uL (ref 150.0–400.0)
RBC: 4.86 Mil/uL (ref 4.22–5.81)
RDW: 13.4 % (ref 11.5–15.5)
WBC: 11.6 10*3/uL — ABNORMAL HIGH (ref 4.0–10.5)

## 2021-11-24 LAB — LIPID PANEL
Cholesterol: 198 mg/dL (ref 0–200)
HDL: 100.1 mg/dL (ref >39.00)
LDL Cholesterol: 76 mg/dL (ref 0–99)
NonHDL: 98.14
Total CHOL/HDL Ratio: 2
Triglycerides: 110 mg/dL (ref 0.0–149.0)
VLDL: 22 mg/dL (ref 0.0–40.0)

## 2021-11-24 LAB — BASIC METABOLIC PANEL
BUN: 13 mg/dL (ref 6–23)
CO2: 31 mEq/L (ref 19–32)
Calcium: 9.4 mg/dL (ref 8.4–10.5)
Chloride: 97 mEq/L (ref 96–112)
Creatinine, Ser: 0.67 mg/dL (ref 0.40–1.50)
GFR: 90.56 mL/min (ref >60.00)
Glucose, Bld: 95 mg/dL (ref 70–99)
Potassium: 4.5 mEq/L (ref 3.5–5.1)
Sodium: 135 mEq/L (ref 135–145)

## 2021-11-24 LAB — HEPATIC FUNCTION PANEL
ALT: 16 U/L (ref 0–53)
AST: 13 U/L (ref 0–37)
Albumin: 4.2 g/dL (ref 3.5–5.2)
Alkaline Phosphatase: 87 U/L (ref 39–117)
Bilirubin, Direct: 0.1 mg/dL (ref 0.0–0.3)
Total Bilirubin: 0.4 mg/dL (ref 0.2–1.2)
Total Protein: 6.8 g/dL (ref 6.0–8.3)

## 2021-11-24 LAB — PSA: PSA: 0.88 ng/mL (ref 0.10–4.00)

## 2021-11-24 LAB — TSH: TSH: 2.48 u[IU]/mL (ref 0.35–5.50)

## 2021-11-29 ENCOUNTER — Other Ambulatory Visit: Payer: Self-pay | Admitting: Internal Medicine

## 2021-11-29 DIAGNOSIS — J418 Mixed simple and mucopurulent chronic bronchitis: Secondary | ICD-10-CM

## 2021-11-29 MED ORDER — REVEFENACIN 175 MCG/3ML IN SOLN: 175.0000 ug | Freq: Every day | RESPIRATORY_TRACT | 1 refills | Status: DC

## 2021-12-03 ENCOUNTER — Telehealth: Payer: Self-pay

## 2021-12-03 NOTE — Telephone Encounter (Signed)
Pharmacy was calling to inform Dr. Ronnald Ramp that a PA is needed for revefenacin (YUPELRI) 175 MCG/3ML nebulizer solution  She started the PA and gave the Key.  KEY: [id number]  FYI

## 2021-12-06 ENCOUNTER — Other Ambulatory Visit: Payer: Self-pay | Admitting: Internal Medicine

## 2021-12-06 DIAGNOSIS — J418 Mixed simple and mucopurulent chronic bronchitis: Secondary | ICD-10-CM

## 2021-12-08 NOTE — Telephone Encounter (Signed)
PA has been initiated with key listed below.  Enter "Legrande Hao" as pt's last name to pull up key.

## 2021-12-08 NOTE — Telephone Encounter (Signed)
Denied by Part D Approved by Part B   See below message per CoverMyMeds:  "This request was denied under your Medicare Part D benefit; however, coverage for the requested drug(s) has been approved under Medicare Part B. [employer] follows Medicare rules. The Medicare rule in Chapter 6 of the Prescription Drug Manual says that drugs covered under the Part B benefit cannot be covered under Part D. Your pharmacy tells [place] where you live when they submit pharmacy claims. Your pharmacy has indicated you are getting this medication at home. The Medicare Benefit Manual (Chapter 15, Section 110.3) says Medicare Part B pays for drugs that require administration by the use of a piece of covered durable medical equipment (DME) such as a nebulizer. [employer] has approved coverage for your drug under your Part B benefit for/through 04/10/2022. If you think Medicare Part D should cover this drug for you, you may appeal."

## 2022-01-19 DIAGNOSIS — H401131 Primary open-angle glaucoma, bilateral, mild stage: Secondary | ICD-10-CM | POA: Diagnosis not present

## 2022-02-21 ENCOUNTER — Other Ambulatory Visit: Payer: Self-pay | Admitting: Internal Medicine

## 2022-02-21 DIAGNOSIS — E7800 Pure hypercholesterolemia, unspecified: Secondary | ICD-10-CM

## 2022-03-10 ENCOUNTER — Telehealth: Payer: Self-pay | Admitting: Internal Medicine

## 2022-03-10 NOTE — Telephone Encounter (Signed)
Left message for patient to call back to schedule Medicare Annual Wellness Visit   Last AWV  03/18/21  Please schedule at anytime with [facility] if patient calls the office back.    Any questions, please call me at [phone]

## 2022-03-10 NOTE — Telephone Encounter (Signed)
LVM for pt to rtn my call to schedule AWV with [facility] call back # [phone] 

## 2022-03-21 ENCOUNTER — Ambulatory Visit (INDEPENDENT_AMBULATORY_CARE_PROVIDER_SITE_OTHER): Payer: Medicare PPO | Admitting: *Deleted

## 2022-03-21 DIAGNOSIS — Z0000 Encounter for general adult medical examination without abnormal findings: Secondary | ICD-10-CM | POA: Diagnosis not present

## 2022-03-21 NOTE — Patient Instructions (Signed)
Health Maintenance, Male Adopting a healthy lifestyle and getting preventive care are important in promoting health and wellness. Ask your health care provider about: The right schedule for you to have regular tests and exams. Things you can do on your own to prevent diseases and keep yourself healthy. What should I know about diet, weight, and exercise? Eat a healthy diet  Eat a diet that includes plenty of vegetables, fruits, low-fat dairy products, and lean protein. Do not eat a lot of foods that are high in solid fats, added sugars, or sodium. Maintain a healthy weight Body mass index (BMI) is a measurement that can be used to identify possible weight problems. It estimates body fat based on height and weight. Your health care provider can help determine your BMI and help you achieve or maintain a healthy weight. Get regular exercise Get regular exercise. This is one of the most important things you can do for your health. Most adults should: Exercise for at least 150 minutes each week. The exercise should increase your heart rate and make you sweat (moderate-intensity exercise). Do strengthening exercises at least twice a week. This is in addition to the moderate-intensity exercise. Spend less time sitting. Even light physical activity can be beneficial. Watch cholesterol and blood lipids Have your blood tested for lipids and cholesterol at 76 years of age, then have this test every 5 years. You may need to have your cholesterol levels checked more often if: Your lipid or cholesterol levels are high. You are older than 77 years of age. You are at high risk for heart disease. What should I know about cancer screening? Many types of cancers can be detected early and may often be prevented. Depending on your health history and family history, you may need to have cancer screening at various ages. This may include screening for: Colorectal cancer. Prostate cancer. Skin cancer. Lung  cancer. What should I know about heart disease, diabetes, and high blood pressure? Blood pressure and heart disease High blood pressure causes heart disease and increases the risk of stroke. This is more likely to develop in people who have high blood pressure readings or are overweight. Talk with your health care provider about your target blood pressure readings. Have your blood pressure checked: Every 3-5 years if you are 18-39 years of age. Every year if you are 40 years old or older. If you are between the ages of 65 and 75 and are a current or former smoker, ask your health care provider if you should have a one-time screening for abdominal aortic aneurysm (AAA). Diabetes Have regular diabetes screenings. This checks your fasting blood sugar level. Have the screening done: Once every three years after age 45 if you are at a normal weight and have a low risk for diabetes. More often and at a younger age if you are overweight or have a high risk for diabetes. What should I know about preventing infection? Hepatitis B If you have a higher risk for hepatitis B, you should be screened for this virus. Talk with your health care provider to find out if you are at risk for hepatitis B infection. Hepatitis C Blood testing is recommended for: Everyone born from 1945 through 1965. Anyone with known risk factors for hepatitis C. Sexually transmitted infections (STIs) You should be screened each year for STIs, including gonorrhea and chlamydia, if: You are sexually active and are younger than 77 years of age. You are older than 77 years of age and your   health care provider tells you that you are at risk for this type of infection. Your sexual activity has changed since you were last screened, and you are at increased risk for chlamydia or gonorrhea. Ask your health care provider if you are at risk. Ask your health care provider about whether you are at high risk for HIV. Your health care provider  may recommend a prescription medicine to help prevent HIV infection. If you choose to take medicine to prevent HIV, you should first get tested for HIV. You should then be tested every 3 months for as long as you are taking the medicine. Follow these instructions at home: Alcohol use Do not drink alcohol if your health care provider tells you not to drink. If you drink alcohol: Limit how much you have to 0-2 drinks a day. Know how much alcohol is in your drink. In the [place] one drink equals one 12 oz bottle of beer (355 mL), one 5 oz glass of Geniene List (148 mL), or one 1 oz glass of hard liquor (44 mL). Lifestyle Do not use any products that contain nicotine or tobacco. These products include cigarettes, chewing tobacco, and vaping devices, such as e-cigarettes. If you need help quitting, ask your health care provider. Do not use street drugs. Do not share needles. Ask your health care provider for help if you need support or information about quitting drugs. General instructions Schedule regular health, dental, and eye exams. Stay current with your vaccines. Tell your health care provider if: You often feel depressed. You have ever been abused or do not feel safe at home. Summary Adopting a healthy lifestyle and getting preventive care are important in promoting health and wellness. Follow your health care provider's instructions about healthy diet, exercising, and getting tested or screened for diseases. Follow your health care provider's instructions on monitoring your cholesterol and blood pressure. This information is not intended to replace advice given to you by your health care provider. Make sure you discuss any questions you have with your health care provider. Document Revised: 08/17/2020 Document Reviewed: 08/17/2020 Elsevier Patient Education  2023 Elsevier Inc.  

## 2022-03-21 NOTE — Progress Notes (Signed)
Subjective:   Joshua Cross is a 76 y.o. male who presents for Medicare Annual/Subsequent preventive examination. I connected with  Joshua Cross on 03/21/22 by a audio enabled telemedicine application and verified that I am speaking with the correct person using two identifiers.  Patient Location: Home  Provider Location: Home Office  I discussed the limitations of evaluation and management by telemedicine. The patient expressed understanding and agreed to proceed.  Review of Systems    Deferred to PCP Cardiac Risk Factors include: advanced age (>69mn, >>81women);dyslipidemia;hypertension;male gender     Objective:    There were no vitals filed for this visit. There is no height or weight on file to calculate BMI.     03/21/2022    2:19 PM 03/18/2021    2:01 PM 11/12/2020    8:17 AM 01/05/2020    9:40 PM 11/25/2019    1:47 PM 11/21/2018    1:10 PM 11/15/2017    1:41 PM  Advanced Directives  Does Patient Have a Medical Advance Directive? Yes Yes Yes No Yes Yes No  Type of AParamedicof AHarrisLiving will  H[place]ise CityLiving will  HB[place] CityLiving will HCarson CityLiving will   Does patient want to make changes to medical advance directive? No - Patient declined No - Patient declined       Copy of [place] Chart? Yes - validated most recent copy scanned in chart (See row information)  No - copy requested  Yes - validated most recent copy scanned in chart (See row information) No - copy requested   Would patient like information on creating a medical advance directive?    No - Patient declined   Yes (ED - Information included in AVS)    Current Medications (verified) Outpatient Encounter Medications as of 03/21/2022  Medication Sig   Acetylcarnitine HCl (ACETYL L-CARNITINE PO) Take 750 mg by mouth daily. 750 MG DAILY   ASHWAGANDHA PO Take 3,000 mcg by mouth daily. 3000  MCG DAILY   Biotin 1 MG CAPS Take 1 mg by mouth daily.   budesonide (PULMICORT) 0.5 MG/2ML nebulizer solution INHALE 2 ML( 1 VIAL) USING NEBULIZER IN THE MORNING AND AT BEDTIME   Cholecalciferol (VITAMIN D) 50 MCG (2000 UT) CAPS Take 2,000 Units by mouth daily.   clobetasol ointment (TEMOVATE) 00.63% Apply 1 application topically 2 (two) times daily.   Coenzyme Q10 (CO Q 10 PO) Take 1 capsule by mouth daily.   formoterol (PERFOROMIST) 20 MCG/2ML nebulizer solution INHALE 1 VIAL(2 ML) USING NEBULIZER TWICE DAILY   ipratropium (ATROVENT) 0.03 % nasal spray INSTILL 2 SPRAYS INTO EACH NOSTRIL EVERY 12 HOURS (Patient taking differently: Place 2 sprays into both nostrils 2 (two) times daily as needed for rhinitis.)   Melatonin 10 MG CAPS Take 10 mg by mouth at bedtime.   Multiple Vitamin (MULTIVITAMIN) tablet Take 1 tablet by mouth daily. CENTRUM SILVER DAILY   OVER THE COUNTER MEDICATION Take 2,400 mcg by mouth in the morning and at bedtime. MUSCADINE SEED 2400 MCG 2 DAILY   PARoxetine (PAXIL) 20 MG tablet TAKE ONE TABLET BY MOUTH ONCE DAILY   Probiotic Product (PROBIOTIC ADVANCED PO) Take 1 capsule by mouth daily. From deep roots   revefenacin (YUPELRI) 175 MCG/3ML nebulizer solution Take 3 mLs (175 mcg total) by nebulization daily.   roflumilast (DALIRESP) 500 MCG TABS tablet Take 1 tablet (500 mcg total) by mouth daily.   rosuvastatin (  CRESTOR) 10 MG tablet TAKE ONE TABLET EVERY DAY   Sod Fluoride-Potassium Nitrate (PREVIDENT 5000 SENSITIVE) 1.1-5 % GEL PreviDent 5000 Enamel Protect 1.1 %-5 % dental paste   TART CHERRY PO Take 2,400 mcg by mouth daily. 2400 MCG DAILY   triamcinolone cream (KENALOG) 0.5 % Apply 1 Application topically 2 (two) times daily.   predniSONE (DELTASONE) 50 MG tablet Take 1 tablet (50 mg total) by mouth daily with breakfast. (Patient not taking: Reported on 03/21/2022)   promethazine (PHENERGAN) 12.5 MG tablet Take 1 tablet (12.5 mg total) by mouth every 6 (six) hours as  needed (cough).   No facility-administered encounter medications on file as of 03/21/2022.    Allergies (verified) Erythromycin and Penicillins   History: Past Medical History:  Diagnosis Date   Allergy    MILD    Anxiety    MILD    Arthritis    Blood transfusion without reported diagnosis    Cataract    REMOVED BOTH EYES    Colon polyp    COPD (chronic obstructive pulmonary disease) (HCC)    Emphysema of lung (HCC)    Hyperlipidemia    Oxygen deficiency    2.5 -3 LITERS - USES DAY AND NIGHT AND USES OFTEN    Past Surgical History:  Procedure Laterality Date   cataracts Bilateral 2019   COLONOSCOPY     COLONOSCOPY WITH PROPOFOL N/A 11/12/2020   Procedure: COLONOSCOPY WITH PROPOFOL;  Surgeon: Irene Shipper, MD;  Location: [facility];  Service: Endoscopy;  Laterality: N/A;   HERNIA REPAIR     LUNG REMOVAL, PARTIAL Right 1985   POLYPECTOMY     POLYPECTOMY  11/12/2020   Procedure: POLYPECTOMY;  Surgeon: Irene Shipper, MD;  Location: [facility];  Service: Endoscopy;;   TONSILLECTOMY     Family History  Problem Relation Age of Onset   Alcohol abuse Mother    Stroke Father    Depression Sister    Heart disease Sister    Cancer Neg Hx    COPD Neg Hx    Early death Neg Hx    Kidney disease Neg Hx    Hypertension Neg Hx    Learning disabilities Neg Hx    Colon cancer Neg Hx    Colon polyps Neg Hx    Esophageal cancer Neg Hx    Rectal cancer Neg Hx    Stomach cancer Neg Hx    Social History   Socioeconomic History   Marital status: Divorced    Spouse name: Not on file   Number of children: Not on file   Years of education: Not on file   Highest education level: Not on file  Occupational History   Occupation: retired  Tobacco Use   Smoking status: Former    Packs/day: 0.25    Years: 50.00    Total pack years: 12.50    Types: Cigarettes    Quit date: 08/06/2004    Years since quitting: 17.6   Smokeless tobacco: Never  Vaping Use   Vaping Use: Never used   Substance and Sexual Activity   Alcohol use: No   Drug use: No   Sexual activity: Not Currently  Other Topics Concern   Not on file  Social History Narrative   Patient is from [place], moved to [place] to attend school at [employer], then started work [employer] studies at college in [place]. 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 He is an avid reader  and active in [place].   Social Determinants of Health   Financial Resource Strain: Low Risk  (03/21/2022)   Overall Financial Resource Strain (CARDIA)    Difficulty of Paying Living Expenses: Not hard at all  Food Insecurity: No Food Insecurity (03/21/2022)   Hunger Vital Sign    Worried About Running Out of Food in the Last Year: Never true    Ran Out of Food in the Last Year: Never true  Transportation Needs: No Transportation Needs (03/21/2022)   PRAPARE - Hydrologist (Medical): No    Lack of Transportation (Non-Medical): No  Physical Activity: Inactive (03/21/2022)   Exercise Vital Sign    Days of Exercise per Week: 0 days    Minutes of Exercise per Session: 0 min  Stress: No Stress Concern Present (03/21/2022)   [place]    Feeling of Stress : Not at all  Social Connections: Moderately Integrated (03/21/2022)   Social Connection and Isolation Panel [NHANES]    Frequency of Communication with Friends and Family: More than three times a week    Frequency of Social Gatherings with Friends and Family: More than three times a week    Attends Religious Services: More than 4 times per year    Active Member of [employer] or Organizations: Yes    Attends Music therapist: More than 4 times per year    Marital Status: Never married    Tobacco Counseling Counseling given: Not Answered   Clinical Intake:  Pre-visit preparation completed: Yes  Pain : No/denies pain     Nutritional Status: BMI of  19-24  Normal Nutritional Risks: None Diabetes: No  How often do you need to have someone help you when you read instructions, pamphlets, or other written materials from your doctor or pharmacy?: 1 - Never What is the last grade level you completed in school?: college  Diabetic?No  Interpreter Needed?: No  Information entered by :: Emelia Loron RN   Activities of Daily Living    03/21/2022    2:17 PM  In your present state of health, do you have any difficulty performing the following activities:  Hearing? 0  Vision? 0  Difficulty concentrating or making decisions? 0  Walking or climbing stairs? 0  Dressing or bathing? 0  Doing errands, shopping? 0  Preparing Food and eating ? N  Using the Toilet? N  In the past six months, have you accidently leaked urine? N  Do you have problems with loss of bowel control? N  Managing your Medications? N  Managing your Finances? N  Housekeeping or managing your Housekeeping? N    Patient Care Team: Janith Lima, MD as PCP - General (Internal Medicine) Irene Shipper, MD as Consulting Physician (Gastroenterology) Danice Goltz, MD as Consulting Physician (Ophthalmology)  Indicate any recent Medical Services you may have received from other than [facility] providers in the past year (date may be approximate).     Assessment:   This is a routine wellness examination for Brendt.  Hearing/Vision screen No results found.  Dietary issues and exercise activities discussed: Current Exercise Habits: The patient does not participate in regular exercise at present, Exercise limited by: respiratory conditions(s)   Goals Addressed             This Visit's Progress    Patient Stated       I want to increase the amount I walk by going  to the antique mall and walk around.      Depression Screen    03/21/2022    2:14 PM 11/16/2021   11:15 AM 03/18/2021    2:00 PM 11/25/2019    1:45 PM 11/21/2018    1:11 PM 11/15/2017    1:41 PM  PHQ  2/9 Scores  PHQ - 2 Score 0 0 0 0 0 0  PHQ- 9 Score  1    0    Fall Risk    03/21/2022    2:23 PM 11/16/2021   11:15 AM 03/18/2021    2:02 PM 11/25/2019    1:50 PM 11/21/2018    1:11 PM  [place] in the past year? 0 1 0 0 0  Number falls in past yr: 0 0 0 0 0  Injury with Fall? 0 0 0 0 0  Risk for fall due to : No Fall Risks History of fall(s) No Fall Risks No Fall Risks History of fall(s)  Follow up Falls evaluation completed Falls evaluation completed Falls prevention discussed Falls evaluation completed;Education provided     FALL RISK PREVENTION PERTAINING TO THE HOME:  Any stairs in or around the home? Yes  If so, are there any without handrails? Yes  Home free of loose throw rugs in walkways, pet beds, electrical cords, etc? Yes  Adequate lighting in your home to reduce risk of falls? Yes   ASSISTIVE DEVICES UTILIZED TO PREVENT FALLS:  Life alert? No  Use of a cane, walker or w/c? No  Grab bars in the bathroom? No  Shower chair or bench in shower? No  Elevated toilet seat or a handicapped toilet? No   Cognitive Function:        03/21/2022    2:19 PM 11/25/2019    1:52 PM  6CIT Screen  What Year? 0 points 0 points  What month? 0 points 0 points  What time? 0 points 0 points  Count back from 20 0 points 0 points  Months in reverse 0 points 0 points  Repeat phrase 0 points 0 points  Total Score 0 points 0 points    Immunizations Immunization History  Administered Date(s) Administered   DTaP 03/07/2016   Influenza Split 02/01/2014, 12/31/2014, 01/24/2018   Influenza, High Dose Seasonal PF 12/06/2018   Influenza, Seasonal, Injecte, Preservative Fre 02/13/2010   Influenza-Unspecified 12/15/2016, 02/04/2018, 12/08/2018, 01/06/2021   PFIZER(Purple Top)SARS-COV-2 Vaccination 05/18/2019, 06/12/2019, 11/28/2019   Pfizer Covid-19 Vaccine Bivalent Booster 26yr & up 01/06/2021   Pneumococcal Conjugate-13 07/27/2013   Pneumococcal Polysaccharide-23 02/12/2010    Pneumococcal-Unspecified 03/08/2015   Tdap 05/27/2015   Zoster, Live 03/08/2011    TDAP status: Up to date  Flu Vaccine status: Due, Education has been provided regarding the importance of this vaccine. Advised may receive this vaccine at local pharmacy or Health Dept. Aware to provide a copy of the vaccination record if obtained from local pharmacy or Health Dept. Verbalized acceptance and understanding.  Pneumococcal vaccine status: Due, Education has been provided regarding the importance of this vaccine. Advised may receive this vaccine at local pharmacy or Health Dept. Aware to provide a copy of the vaccination record if obtained from local pharmacy or Health Dept. Verbalized acceptance and understanding.  Covid-19 vaccine status: Information provided on how to obtain vaccines.   Qualifies for Shingles Vaccine? Yes   Zostavax completed No   Shingrix Completed?: No.    Education has been provided regarding the importance of this vaccine. Patient has been  advised to call insurance company to determine out of pocket expense if they have not yet received this vaccine. Advised may also receive vaccine at local pharmacy or Health Dept. Verbalized acceptance and understanding.  Screening Tests Health Maintenance  Topic Date Due   Zoster Vaccines- Shingrix (1 of 2) Never done   Pneumonia Vaccine 49+ Years old (3 - PPSV23 or PCV20) 03/07/2016   OPHTHALMOLOGY EXAM  04/19/2019   HEMOGLOBIN A1C  05/15/2020   Diabetic kidney evaluation - Urine ACR  11/12/2020   FOOT EXAM  11/13/2020   COVID-19 Vaccine (5 - 2023-24 season) 12/10/2021   INFLUENZA VACCINE  07/10/2022 (Originally 11/09/2021)   Diabetic kidney evaluation - eGFR measurement  11/25/2022   Medicare Annual Wellness (AWV)  03/22/2023   COLONOSCOPY (Pts 45-60yr Insurance coverage will need to be confirmed)  11/13/2023   DTaP/Tdap/Td (3 - Td or Tdap) 03/07/2026   Hepatitis C Screening  Completed   HPV VACCINES  Aged Out    Health  Maintenance  Health Maintenance Due  Topic Date Due   Zoster Vaccines- Shingrix (1 of 2) Never done   Pneumonia Vaccine 77 Years old (3 - PPSV23 or PCV20) 03/07/2016   OPHTHALMOLOGY EXAM  04/19/2019   HEMOGLOBIN A1C  05/15/2020   Diabetic kidney evaluation - Urine ACR  11/12/2020   FOOT EXAM  11/13/2020   COVID-19 Vaccine (5 - 2023-24 season) 12/10/2021    Colorectal cancer screening: Type of screening: Colonoscopy. Completed 11/12/20. Repeat every 3 years  Lung Cancer Screening: (Low Dose CT Chest recommended if Age 63106-80years, 30 pack-year currently smoking OR have quit w/in 15years.) does qualify.   Lung Cancer Screening Referral: Deferred to PCP  Additional Screening:  Hepatitis C Screening: does qualify; Completed 05/10/17  Vision Screening: Recommended annual ophthalmology exams for early detection of glaucoma and other disorders of the eye. Is the patient up to date with their annual eye exam?  Yes  Who is the provider or what is the name of the office in which the patient attends annual eye exams? CConst[facility] EnergyIf pt is not established with a provider, would they like to be referred to a provider to establish care?  N/A .   Dental Screening: Recommended annual dental exams for proper oral hygiene  Community Resource Referral / Chronic Care Management: CRR required this visit?  No   CCM required this visit?  No      Plan:     I have personally reviewed and noted the following in the patient's chart:   Medical and social history Use of alcohol, tobacco or illicit drugs  Current medications and supplements including opioid prescriptions. Patient is not currently taking opioid prescriptions. Functional ability and status Nutritional status Physical activity Advanced directives List of other physicians Hospitalizations, surgeries, and ER visits in previous 12 months Vitals Screenings to include cognitive, depression, and falls Referrals and  appointments  In addition, I have reviewed and discussed with patient certain preventive protocols, quality metrics, and best practice recommendations. A written personalized care plan for preventive services as well as general preventive health recommendations were provided to patient.     JMichiel Cowboy RN   03/21/2022   Nurse Notes:  Mr. CBazinet, Thank you for taking time to come for your Medicare Wellness Visit. I appreciate your ongoing commitment to your health goals. Please review the following plan we discussed and let me know if I can assist you in the future.   These are the goals  we discussed:  Goals       Patient Stated     Patient Stated (pt-stated)      My goal is to keep my weight and glucose within normal range and be more active.      Patient Stated (pt-stated)      Patient declined health goal at this time.      Other     Patient Stated      Continue to be socially engaged with people, enjoy life, family and friends. Enjoy staying updated by reading my papers.        Patient Stated      I want to increase the amount I walk by going to the antique mall and walk around.        This is a list of the screening recommended for you and due dates:  Health Maintenance  Topic Date Due   Zoster (Shingles) Vaccine (1 of 2) Never done   Pneumonia Vaccine (3 - PPSV23 or PCV20) 03/07/2016   Eye exam for diabetics  04/19/2019   Hemoglobin A1C  05/15/2020   Yearly kidney health urinalysis for diabetes  11/12/2020   Complete foot exam   11/13/2020   COVID-19 Vaccine (5 - 2023-24 season) 12/10/2021   Flu Shot  07/10/2022*   Yearly kidney function blood test for diabetes  11/25/2022   Medicare Annual Wellness Visit  03/22/2023   Colon Cancer Screening  11/13/2023   DTaP/Tdap/Td vaccine (3 - Td or Tdap) 03/07/2026   Hepatitis C Screening: USPSTF Recommendation to screen - Ages 13-79 yo.  Completed   HPV Vaccine  Aged Out  *Topic was postponed. The date shown is not  the original due date.

## 2022-03-29 ENCOUNTER — Other Ambulatory Visit: Payer: Self-pay | Admitting: Internal Medicine

## 2022-03-29 DIAGNOSIS — F3342 Major depressive disorder, recurrent, in full remission: Secondary | ICD-10-CM

## 2022-04-05 ENCOUNTER — Ambulatory Visit (INDEPENDENT_AMBULATORY_CARE_PROVIDER_SITE_OTHER): Payer: Medicare PPO

## 2022-04-05 ENCOUNTER — Encounter: Payer: Self-pay | Admitting: Internal Medicine

## 2022-04-05 ENCOUNTER — Ambulatory Visit: Payer: Medicare PPO | Admitting: Internal Medicine

## 2022-04-05 VITALS — BP 122/60 | HR 90 | Temp 98.3°F | Resp 20 | Ht 66.0 in | Wt 126.0 lb

## 2022-04-05 DIAGNOSIS — J9611 Chronic respiratory failure with hypoxia: Secondary | ICD-10-CM

## 2022-04-05 DIAGNOSIS — J301 Allergic rhinitis due to pollen: Secondary | ICD-10-CM

## 2022-04-05 DIAGNOSIS — Z23 Encounter for immunization: Secondary | ICD-10-CM | POA: Diagnosis not present

## 2022-04-05 DIAGNOSIS — R059 Cough, unspecified: Secondary | ICD-10-CM | POA: Diagnosis not present

## 2022-04-05 DIAGNOSIS — J441 Chronic obstructive pulmonary disease with (acute) exacerbation: Secondary | ICD-10-CM | POA: Diagnosis not present

## 2022-04-05 DIAGNOSIS — J418 Mixed simple and mucopurulent chronic bronchitis: Secondary | ICD-10-CM | POA: Diagnosis not present

## 2022-04-05 DIAGNOSIS — R052 Subacute cough: Secondary | ICD-10-CM

## 2022-04-05 DIAGNOSIS — R0602 Shortness of breath: Secondary | ICD-10-CM | POA: Diagnosis not present

## 2022-04-05 DIAGNOSIS — J439 Emphysema, unspecified: Secondary | ICD-10-CM | POA: Diagnosis not present

## 2022-04-05 DIAGNOSIS — J4541 Moderate persistent asthma with (acute) exacerbation: Secondary | ICD-10-CM

## 2022-04-05 MED ORDER — AZITHROMYCIN 500 MG PO TABS: 500.0000 mg | ORAL_TABLET | Freq: Every day | ORAL | 0 refills | Status: AC

## 2022-04-05 MED ORDER — METHYLPREDNISOLONE ACETATE 80 MG/ML IJ SUSP
80.0000 mg | Freq: Once | INTRAMUSCULAR | Status: AC
  Administered 2022-04-05: 80 mg via INTRAMUSCULAR

## 2022-04-05 MED ORDER — CHLORPHENIRAMINE MALEATE 2 MG/5ML PO SYRP: 2.0000 mg | ORAL_SOLUTION | Freq: Four times a day (QID) | ORAL | 1 refills | Status: DC | PRN

## 2022-04-05 NOTE — Progress Notes (Unsigned)
Subjective:  Patient ID: Joshua Cross, male    DOB: [birth year]  Age: 76 y.o. MRN: [id number]  CC: Cough and COPD   HPI JAE SKEET presents for f/up -  He complains of a 4 day hx of productive cough with wheezing, SOB, chills and runny nose.  Outpatient Medications Prior to Visit  Medication Sig Dispense Refill   Acetylcarnitine HCl (ACETYL L-CARNITINE PO) Take 750 mg by mouth daily. 750 MG DAILY     ASHWAGANDHA PO Take 3,000 mcg by mouth daily. 3000 MCG DAILY     Biotin 1 MG CAPS Take 1 mg by mouth daily.     budesonide (PULMICORT) 0.5 MG/2ML nebulizer solution INHALE 2 ML( 1 VIAL) USING NEBULIZER IN THE MORNING AND AT BEDTIME 360 mL 1   Cholecalciferol (VITAMIN D) 50 MCG (2000 UT) CAPS Take 2,000 Units by mouth daily.     clobetasol ointment (TEMOVATE) 5.03 % Apply 1 application topically 2 (two) times daily. 45 g 1   Coenzyme Q10 (CO Q 10 PO) Take 1 capsule by mouth daily.     formoterol (PERFOROMIST) 20 MCG/2ML nebulizer solution INHALE 1 VIAL(2 ML) USING NEBULIZER TWICE DAILY 360 mL 1   ipratropium (ATROVENT) 0.03 % nasal spray INSTILL 2 SPRAYS INTO EACH NOSTRIL EVERY 12 HOURS (Patient taking differently: Place 2 sprays into both nostrils 2 (two) times daily as needed for rhinitis.) 30 mL 5   Melatonin 10 MG CAPS Take 10 mg by mouth at bedtime.     Multiple Vitamin (MULTIVITAMIN) tablet Take 1 tablet by mouth daily. CENTRUM SILVER DAILY     OVER THE COUNTER MEDICATION Take 2,400 mcg by mouth in the morning and at bedtime. MUSCADINE SEED 2400 MCG 2 DAILY     PARoxetine (PAXIL) 20 MG tablet TAKE ONE TABLET BY MOUTH ONCE DAILY 90 tablet 1   Probiotic Product (PROBIOTIC ADVANCED PO) Take 1 capsule by mouth daily. From deep roots     revefenacin (YUPELRI) 175 MCG/3ML nebulizer solution Take 3 mLs (175 mcg total) by nebulization daily. 270 mL 1   roflumilast (DALIRESP) 500 MCG TABS tablet Take 1 tablet (500 mcg total) by mouth daily. 90 tablet 1   rosuvastatin (CRESTOR) 10  MG tablet TAKE ONE TABLET EVERY DAY 90 tablet 0   Sod Fluoride-Potassium Nitrate (PREVIDENT 5000 SENSITIVE) 1.1-5 % GEL PreviDent 5000 Enamel Protect 1.1 %-5 % dental paste     TART CHERRY PO Take 2,400 mcg by mouth daily. 2400 MCG DAILY     triamcinolone cream (KENALOG) 0.5 % Apply 1 Application topically 2 (two) times daily. 30 g 1   predniSONE (DELTASONE) 50 MG tablet Take 1 tablet (50 mg total) by mouth daily with breakfast. (Patient not taking: Reported on 03/21/2022) 5 tablet 0   promethazine (PHENERGAN) 12.5 MG tablet Take 1 tablet (12.5 mg total) by mouth every 6 (six) hours as needed (cough). 30 tablet 0   No facility-administered medications prior to visit.    ROS Review of Systems  Constitutional:  Positive for chills. Negative for fatigue and fever.  HENT:  Positive for rhinorrhea and sinus pain. Negative for nosebleeds, postnasal drip, sinus pressure, sore throat and trouble swallowing.   Respiratory:  Positive for cough, shortness of breath and wheezing. Negative for choking, chest tightness and stridor.   Cardiovascular:  Negative for chest pain, palpitations and leg swelling.  Gastrointestinal:  Negative for abdominal pain, constipation, diarrhea, nausea and vomiting.  Genitourinary: Negative.  Negative for difficulty urinating.  Musculoskeletal: Negative.  Negative for arthralgias and myalgias.  Skin: Negative.  Negative for color change and pallor.  Neurological: Negative.  Negative for dizziness, weakness and headaches.  Hematological:  Negative for adenopathy. Does not bruise/bleed easily.  Psychiatric/Behavioral: Negative.      Objective:  BP 122/60 (BP Location: Right Arm, Patient Position: Sitting, Cuff Size: Large)   Pulse 90   Temp 98.3 F (36.8 C) (Oral)   Resp 20   Ht '5\' 6"'$  (1.676 m)   Wt 126 lb (57.2 kg)   SpO2 92%   BMI 20.34 kg/m   BP Readings from Last 3 Encounters:  04/05/22 122/60  11/16/21 (!) 110/56  11/12/20 129/82    Wt Readings from  Last 3 Encounters:  04/05/22 126 lb (57.2 kg)  11/16/21 127 lb 4 oz (57.7 kg)  11/12/20 130 lb (59 kg)    Physical Exam Vitals reviewed.  Constitutional:      General: He is not in acute distress.    Appearance: He is ill-appearing. He is not toxic-appearing or diaphoretic.  HENT:     Nose: Nose normal.     Mouth/Throat:     Mouth: Mucous membranes are moist.  Eyes:     General: No scleral icterus.    Conjunctiva/sclera: Conjunctivae normal.  Cardiovascular:     Rate and Rhythm: Normal rate and regular rhythm.     Heart sounds: No murmur heard.    No gallop.  Pulmonary:     Effort: Tachypnea and accessory muscle usage present. No prolonged expiration, respiratory distress or retractions.     Breath sounds: No stridor. Examination of the right-upper field reveals decreased breath sounds. Examination of the left-upper field reveals decreased breath sounds. Examination of the right-middle field reveals decreased breath sounds. Examination of the left-middle field reveals decreased breath sounds and rhonchi. Examination of the right-lower field reveals decreased breath sounds. Examination of the left-lower field reveals decreased breath sounds. Decreased breath sounds and rhonchi present. No wheezing or rales.  Chest:     Chest wall: No tenderness.  Abdominal:     General: Abdomen is flat.     Palpations: There is no mass.     Tenderness: There is no abdominal tenderness. There is no guarding.     Hernia: No hernia is present.  Musculoskeletal:        General: Normal range of motion.     Cervical back: Neck supple.     Right lower leg: No edema.     Left lower leg: No edema.  Lymphadenopathy:     Cervical: No cervical adenopathy.  Skin:    General: Skin is warm and dry.  Neurological:     General: No focal deficit present.     Mental Status: He is alert.  Psychiatric:        Mood and Affect: Mood normal.        Behavior: Behavior normal.     Lab Results  Component Value  Date   WBC 11.6 (H) 11/24/2021   HGB 15.1 11/24/2021   HCT 45.1 11/24/2021   PLT 374.0 11/24/2021   GLUCOSE 95 11/24/2021   CHOL 198 11/24/2021   TRIG 110.0 11/24/2021   HDL 100.10 11/24/2021   LDLCALC 76 11/24/2021   ALT 16 11/24/2021   AST 13 11/24/2021   NA 135 11/24/2021   K 4.5 11/24/2021   CL 97 11/24/2021   CREATININE 0.67 11/24/2021   BUN 13 11/24/2021   CO2 31 11/24/2021   TSH 2.48 11/24/2021  PSA 0.88 11/24/2021   HGBA1C 5.9 (H) 11/13/2019   MICROALBUR 0.2 11/13/2019    DG Chest 2 View  Result Date: 04/05/2022 CLINICAL DATA:  Cough over the last 4 days. Congestion and shortness of breath. EXAM: CHEST - 2 VIEW COMPARISON:  09/15/2020 FINDINGS: Heart size is normal. Severe chronic emphysema with hyperinflation and scarring. Sutures noted in the medial right upper lung. No sign of consolidation, collapse or effusion. No acute bone finding. IMPRESSION: Severe chronic emphysema.  Sutures in the medial right upper lung. Electronically Signed   By: Nelson Chimes M.D.   On: 04/05/2022 10:51     Assessment & Plan:   Gotham was seen today for cough and copd.  Diagnoses and all orders for this visit:  Subacute cough- Chest Xray is negative for mass/infiltrate. -     DG Chest 2 View; Future -     chlorpheniramine (CHLOR-TRIMETON) 2 MG/5ML syrup; Take 5 mLs (2 mg total) by mouth every 6 (six) hours as needed for allergies.  Chronic respiratory failure with hypoxia ([place])- Will continue O2.  Mixed simple and mucopurulent chronic bronchitis (HCC)  Chronic obstructive pulmonary disease with acute exacerbation (HCC) -     azithromycin (ZITHROMAX) 500 MG tablet; Take 1 tablet (500 mg total) by mouth daily for 3 days.  Seasonal allergic rhinitis due to pollen -     chlorpheniramine (CHLOR-TRIMETON) 2 MG/5ML syrup; Take 5 mLs (2 mg total) by mouth every 6 (six) hours as needed for allergies.  Need for vaccination -     Pneumococcal polysaccharide vaccine 23-valent greater than  or equal to 2yo subcutaneous/IM  Moderate persistent asthma with exacerbation- Will treat with steroids. -     methylPREDNISolone acetate (DEPO-MEDROL) injection 80 mg   I have discontinued [name] "Jim"'s promethazine and predniSONE. I am also having him start on chlorpheniramine and azithromycin. Additionally, I am having him maintain his Probiotic Product (PROBIOTIC ADVANCED PO), Melatonin, Vitamin D, ipratropium, Biotin, Acetylcarnitine HCl (ACETYL L-CARNITINE PO), multivitamin, Coenzyme Q10 (CO Q 10 PO), ASHWAGANDHA PO, OVER THE COUNTER MEDICATION, TART CHERRY PO, PreviDent 5000 Sensitive, clobetasol ointment, formoterol, triamcinolone cream, roflumilast, revefenacin, budesonide, rosuvastatin, and PARoxetine. We administered methylPREDNISolone acetate.  Meds ordered this encounter  Medications   chlorpheniramine (CHLOR-TRIMETON) 2 MG/5ML syrup    Sig: Take 5 mLs (2 mg total) by mouth every 6 (six) hours as needed for allergies.    Dispense:  473 mL    Refill:  1   methylPREDNISolone acetate (DEPO-MEDROL) injection 80 mg   azithromycin (ZITHROMAX) 500 MG tablet    Sig: Take 1 tablet (500 mg total) by mouth daily for 3 days.    Dispense:  3 tablet    Refill:  0     Follow-up: Return in about 3 months (around 07/05/2022).  Scarlette Calico, MD

## 2022-04-05 NOTE — Patient Instructions (Signed)

## 2022-04-12 ENCOUNTER — Other Ambulatory Visit: Payer: Self-pay | Admitting: Internal Medicine

## 2022-04-12 DIAGNOSIS — J22 Unspecified acute lower respiratory infection: Secondary | ICD-10-CM

## 2022-04-12 MED ORDER — CEFDINIR 300 MG PO CAPS: 300.0000 mg | ORAL_CAPSULE | Freq: Two times a day (BID) | ORAL | 0 refills | Status: AC

## 2022-05-09 ENCOUNTER — Other Ambulatory Visit: Payer: Self-pay | Admitting: Internal Medicine

## 2022-05-09 DIAGNOSIS — J418 Mixed simple and mucopurulent chronic bronchitis: Secondary | ICD-10-CM

## 2022-05-23 ENCOUNTER — Other Ambulatory Visit: Payer: Self-pay | Admitting: Internal Medicine

## 2022-05-23 DIAGNOSIS — E7800 Pure hypercholesterolemia, unspecified: Secondary | ICD-10-CM

## 2022-05-23 DIAGNOSIS — S46209A Unspecified injury of muscle, fascia and tendon of other parts of biceps, unspecified arm, initial encounter: Secondary | ICD-10-CM | POA: Insufficient documentation

## 2022-05-23 NOTE — Progress Notes (Unsigned)
I, Joshua Cross, LAT, ATC acting as a scribe for Joshua Leader, MD.  Subjective:    CC: L arm pain  HPI: Pt is 77 y/o male c/o L arm pain x 1 week. No MOI. Pt c/o limited AROM. Pt locates pain to in his L upper arm. Pt c/o increased pain trying to sleep at night, laying on his L side. Pt is R-hand dominate. His shoulder pain was quite bad earlier this week or last week but has improved significantly over the last day or 2.  Radiates: no UE Numbness/tingling: no UE Weakness: yes Aggravates: shoulder aBd, cleaning his house Treatments tried: icy hot  He has a history of COPD.  He has neutral colored nail polish and notes that whenever he gets his blood oxygen level checked using a an office-based pulse oximeter is usually 85 to 87%.  He does not feel especially short of breath today.  Pertinent review of Systems: No shortness of breath outside of his normal.  No fevers or chills.  No increased productive cough  Relevant historical information: Significant COPD/emphysema.   Objective:    Vitals:   05/24/22 1513  BP: 118/68  Pulse: 85  SpO2: (!) 85%   General: Well Developed, well nourished, and in no acute distress.   MSK: Left shoulder: Normal-appearing Nontender. Normal motion mild pain with abduction. Intact strength. Positive Hawkins and Neer's test. Positive empty can test. Negative Yergason's and speeds test.  Lungs: Normal work of breathing.  Clear to auscultation bilaterally.  Lab and Radiology Results  X-ray images left shoulder obtained today personally and independently interpreted No severe glenohumeral DJD.  Mild AC DJD.  No acute fractures.  Emphysema type changes visible on left lung fields Await formal radiology review    Impression and Recommendations:    Assessment and Plan: 77 y.o. male with left shoulder pain.  Patient had an acute exacerbation of left shoulder pain earlier this week that is resolving spontaneously.  Plan for a bit of  watchful waiting and if not improving sufficiently next step should probably be physical therapy.  We can add physical therapy anytime.  He will let me know.  He also return at a time for cortisone shot.  Additionally we may want to consider a quick trial of oral steroids if needed as well.  Let me know how he feels and we can negotiate next steps based on how he does.  He is getting better so I do not think we are going to need to do much.  His pulse oximeter blood oxygen saturation was read at 85% today.  I think some of that is because he probably does have chronically low blood oxygen saturation and I think part of that is due to nail polish.  He feels as though he is at baseline from respiratory standpoint.  No intervention needed at this time beyond watchful waiting. .  Encouraged use of home oxygen especially with ambulation and exertion.  PDMP not reviewed this encounter. Orders Placed This Encounter  Procedures   DG Shoulder Left    Standing Status:   Future    Number of Occurrences:   1    Standing Expiration Date:   06/22/2022    Order Specific Question:   Reason for Exam (SYMPTOM  OR DIAGNOSIS REQUIRED)    Answer:   left shoulder pain    Order Specific Question:   Preferred imaging location?    Answer:   [name]   No orders of  the defined types were placed in this encounter.   Discussed warning signs or symptoms. Please see discharge instructions. Patient expresses understanding.   The above documentation has been reviewed and is accurate and complete Joshua Cross, M.D.

## 2022-05-24 ENCOUNTER — Ambulatory Visit (INDEPENDENT_AMBULATORY_CARE_PROVIDER_SITE_OTHER): Payer: Medicare PPO

## 2022-05-24 ENCOUNTER — Ambulatory Visit: Payer: Medicare PPO | Admitting: Family Medicine

## 2022-05-24 ENCOUNTER — Ambulatory Visit: Payer: Self-pay

## 2022-05-24 VITALS — BP 118/68 | HR 85 | Ht 66.0 in | Wt 124.0 lb

## 2022-05-24 DIAGNOSIS — M25512 Pain in left shoulder: Secondary | ICD-10-CM

## 2022-05-24 DIAGNOSIS — J9611 Chronic respiratory failure with hypoxia: Secondary | ICD-10-CM

## 2022-05-24 DIAGNOSIS — M19012 Primary osteoarthritis, left shoulder: Secondary | ICD-10-CM | POA: Diagnosis not present

## 2022-05-24 NOTE — Patient Instructions (Addendum)
Thank you for coming in today.   Please get an Xray today before you leave   Let's plan for some watchful waiting.  Send me a MyChart message if not continuing to get better

## 2022-05-27 NOTE — Progress Notes (Signed)
Left shoulder x-ray shows some arthritis of the small joint at the top of the shoulder.

## 2022-05-30 ENCOUNTER — Other Ambulatory Visit: Payer: Self-pay | Admitting: Internal Medicine

## 2022-05-30 DIAGNOSIS — J9611 Chronic respiratory failure with hypoxia: Secondary | ICD-10-CM

## 2022-05-30 DIAGNOSIS — J418 Mixed simple and mucopurulent chronic bronchitis: Secondary | ICD-10-CM

## 2022-06-10 DIAGNOSIS — J449 Chronic obstructive pulmonary disease, unspecified: Secondary | ICD-10-CM | POA: Diagnosis not present

## 2022-06-23 ENCOUNTER — Ambulatory Visit: Payer: Medicare PPO | Admitting: Internal Medicine

## 2022-06-23 ENCOUNTER — Encounter: Payer: Self-pay | Admitting: Internal Medicine

## 2022-06-23 ENCOUNTER — Ambulatory Visit (INDEPENDENT_AMBULATORY_CARE_PROVIDER_SITE_OTHER): Payer: Medicare PPO

## 2022-06-23 VITALS — BP 118/64 | HR 74 | Temp 98.2°F | Resp 20 | Ht 66.0 in | Wt 122.0 lb

## 2022-06-23 DIAGNOSIS — J9611 Chronic respiratory failure with hypoxia: Secondary | ICD-10-CM | POA: Diagnosis not present

## 2022-06-23 DIAGNOSIS — R079 Chest pain, unspecified: Secondary | ICD-10-CM | POA: Diagnosis not present

## 2022-06-23 DIAGNOSIS — R0789 Other chest pain: Secondary | ICD-10-CM | POA: Insufficient documentation

## 2022-06-23 DIAGNOSIS — R918 Other nonspecific abnormal finding of lung field: Secondary | ICD-10-CM

## 2022-06-23 LAB — CBC WITH DIFFERENTIAL/PLATELET
Basophils Absolute: 0 K/uL (ref 0.0–0.1)
Basophils Relative: 0.4 % (ref 0.0–3.0)
Eosinophils Absolute: 0.1 K/uL (ref 0.0–0.7)
Eosinophils Relative: 1 % (ref 0.0–5.0)
HCT: 43 % (ref 39.0–52.0)
Hemoglobin: 14.8 g/dL (ref 13.0–17.0)
Lymphocytes Relative: 39.7 % (ref 12.0–46.0)
Lymphs Abs: 3.5 K/uL (ref 0.7–4.0)
MCHC: 34.4 g/dL (ref 30.0–36.0)
MCV: 92.5 fl (ref 78.0–100.0)
Monocytes Absolute: 0.5 K/uL (ref 0.1–1.0)
Monocytes Relative: 5.5 % (ref 3.0–12.0)
Neutro Abs: 4.7 K/uL (ref 1.4–7.7)
Neutrophils Relative %: 53.4 % (ref 43.0–77.0)
Platelets: 322 K/uL (ref 150.0–400.0)
RBC: 4.65 Mil/uL (ref 4.22–5.81)
RDW: 13 % (ref 11.5–15.5)
WBC: 8.7 K/uL (ref 4.0–10.5)

## 2022-06-23 LAB — TROPONIN I (HIGH SENSITIVITY): High Sens Troponin I: 4 ng/L (ref 2–17)

## 2022-06-23 LAB — D-DIMER, QUANTITATIVE: D-Dimer, Quant: 0.2 mcg/mL FEU (ref <0.50)

## 2022-06-23 LAB — BRAIN NATRIURETIC PEPTIDE: Pro B Natriuretic peptide (BNP): 39 pg/mL (ref 0.0–100.0)

## 2022-06-23 NOTE — Progress Notes (Signed)
Subjective:  Patient ID: Joshua Cross, male    DOB: 08-26-1945  Age: 77 y.o. MRN: [id number]  CC: Chest Pain   HPI Joshua Cross presents for f/up -  He complains of a 1 month history of chest pain that is predominantly on the right side but occasionally radiates to the substernal region.  He describes it as a tightness that is worsened with movement.  Outpatient Medications Prior to Visit  Medication Sig Dispense Refill   Acetylcarnitine HCl (ACETYL L-CARNITINE PO) Take 750 mg by mouth daily. 750 MG DAILY     ASHWAGANDHA PO Take 3,000 mcg by mouth daily. 3000 MCG DAILY     Biotin 1 MG CAPS Take 1 mg by mouth daily.     budesonide (PULMICORT) 0.5 MG/2ML nebulizer solution INHALE 2 ML( 1 VIAL) USING NEBULIZER IN THE MORNING AND AT BEDTIME 360 mL 1   Cholecalciferol (VITAMIN D) 50 MCG (2000 UT) CAPS Take 2,000 Units by mouth daily.     Coenzyme Q10 (CO Q 10 PO) Take 1 capsule by mouth daily.     formoterol (PERFOROMIST) 20 MCG/2ML nebulizer solution INHALE 1 VIAL(2ML) USING NEBULIZER TWICE DAILY 360 mL 0   ipratropium (ATROVENT) 0.03 % nasal spray INSTILL 2 SPRAYS INTO EACH NOSTRIL EVERY 12 HOURS (Patient taking differently: Place 2 sprays into both nostrils 2 (two) times daily as needed for rhinitis.) 30 mL 5   Melatonin 10 MG CAPS Take 10 mg by mouth at bedtime.     Multiple Vitamin (MULTIVITAMIN) tablet Take 1 tablet by mouth daily. CENTRUM SILVER DAILY     OVER THE COUNTER MEDICATION Take 2,400 mcg by mouth in the morning and at bedtime. MUSCADINE SEED 2400 MCG 2 DAILY     PARoxetine (PAXIL) 20 MG tablet TAKE ONE TABLET BY MOUTH ONCE DAILY 90 tablet 1   Probiotic Product (PROBIOTIC ADVANCED PO) Take 1 capsule by mouth daily. From deep roots     roflumilast (DALIRESP) 500 MCG TABS tablet Take 1 tablet (500 mcg total) by mouth daily. 90 tablet 1   rosuvastatin (CRESTOR) 10 MG tablet TAKE ONE TABLET EVERY DAY 90 tablet 0   Sod Fluoride-Potassium Nitrate (PREVIDENT 5000  SENSITIVE) 1.1-5 % GEL PreviDent 5000 Enamel Protect 1.1 %-5 % dental paste     TART CHERRY PO Take 2,400 mcg by mouth daily. 2400 MCG DAILY     YUPELRI 175 MCG/3ML nebulizer solution INHALE 1 VIAL(3 ML) INTO THE LUNGS USING NEBULIZER DAILY 270 mL 1   No facility-administered medications prior to visit.    ROS Review of Systems  Constitutional: Negative.  Negative for chills, diaphoresis, fatigue, fever and unexpected weight change.  HENT: Negative.    Respiratory:  Positive for chest tightness and shortness of breath. Negative for cough, wheezing and stridor.   Cardiovascular:  Positive for chest pain. Negative for palpitations and leg swelling.  Gastrointestinal:  Negative for abdominal pain, diarrhea, nausea and vomiting.  Endocrine: Negative.   Genitourinary: Negative.   Musculoskeletal: Negative.   Skin: Negative.   Allergic/Immunologic: Negative.   Neurological: Negative.  Negative for dizziness.  Hematological:  Negative for adenopathy. Does not bruise/bleed easily.  Psychiatric/Behavioral: Negative.      Objective:  BP 118/64 (BP Location: Left Arm, Patient Position: Sitting, Cuff Size: Large)   Pulse 74   Temp 98.2 F (36.8 C) (Oral)   Resp 20   Ht 5\' 6"  (1.676 m)   Wt 122 lb (55.3 kg)   SpO2 (!) 89%  BMI 19.69 kg/m   BP Readings from Last 3 Encounters:  06/23/22 118/64  05/24/22 118/68  04/05/22 122/60    Wt Readings from Last 3 Encounters:  06/23/22 122 lb (55.3 kg)  05/24/22 124 lb (56.2 kg)  04/05/22 126 lb (57.2 kg)    Physical Exam Vitals reviewed.  Constitutional:      General: He is not in acute distress.    Appearance: He is not ill-appearing, toxic-appearing or diaphoretic.  HENT:     Nose: Nose normal.     Mouth/Throat:     Mouth: Mucous membranes are moist.  Eyes:     General: No scleral icterus.    Conjunctiva/sclera: Conjunctivae normal.  Cardiovascular:     Rate and Rhythm: Normal rate and regular rhythm.     Heart sounds: Normal  heart sounds, S1 normal and S2 normal. No murmur heard.    No friction rub. No gallop.     Comments: EKG-- NSR, 60 bpm No ST/T wave changes, LVH, or Q waves Normal EKG Pulmonary:     Effort: Pulmonary effort is normal. No respiratory distress.     Breath sounds: No stridor. No wheezing, rhonchi or rales.  Chest:     Chest wall: No tenderness.  Abdominal:     General: Abdomen is flat.     Palpations: There is no mass.     Tenderness: There is no abdominal tenderness. There is no guarding or rebound.     Hernia: No hernia is present.  Musculoskeletal:     Cervical back: Neck supple.     Right lower leg: No edema.     Left lower leg: No edema.  Lymphadenopathy:     Cervical: No cervical adenopathy.  Skin:    General: Skin is warm and dry.  Neurological:     General: No focal deficit present.     Mental Status: He is alert. Mental status is at baseline.  Psychiatric:        Mood and Affect: Mood normal.        Behavior: Behavior normal.     Lab Results  Component Value Date   WBC 8.7 06/23/2022   HGB 14.8 06/23/2022   HCT 43.0 06/23/2022   PLT 322.0 06/23/2022   GLUCOSE 95 11/24/2021   CHOL 198 11/24/2021   TRIG 110.0 11/24/2021   HDL 100.10 11/24/2021   LDLCALC 76 11/24/2021   ALT 16 11/24/2021   AST 13 11/24/2021   NA 135 11/24/2021   K 4.5 11/24/2021   CL 97 11/24/2021   CREATININE 0.67 11/24/2021   BUN 13 11/24/2021   CO2 31 11/24/2021   TSH 2.48 11/24/2021   PSA 0.88 11/24/2021   HGBA1C 5.9 (H) 11/13/2019   MICROALBUR 0.2 11/13/2019    DG Chest 2 View  Result Date: 06/23/2022 CLINICAL DATA:  Patient c/o chest pain x 1 month. Hx of COPD. Former smoker. EXAM: CHEST - 2 VIEW COMPARISON:  04/05/2022 FINDINGS: Attenuated peripheral bronchovascular markings with coarse perihilar interstitial opacities, stable since previous. No new infiltrate or overt edema. Staple line medially in the right upper lung as before. Heart size and mediastinal contours are within  normal limits. No effusion.  No pneumothorax. Stable changes of right thoracotomy. IMPRESSION: Stable chronic and postoperative changes. No acute findings. Electronically Signed   By: Lucrezia Europe M.D.   On: 06/23/2022 16:21     Assessment & Plan:    Tightness in chest- Exam, EKG, chest x-ray, and labs are normal. Will get  a CT scan to evaluate for occult malignancy. -     Troponin I (High Sensitivity); Future -     D-dimer, quantitative; Future -     Brain natriuretic peptide; Future -     DG Chest 2 View; Future -     CBC with Differential/Platelet; Future -     EKG 12-Lead  Chronic respiratory failure with hypoxia ([place])- This is stable. -     CBC with Differential/Platelet; Future     Follow-up: No follow-ups on file.  Scarlette Calico, MD

## 2022-06-25 DIAGNOSIS — R918 Other nonspecific abnormal finding of lung field: Secondary | ICD-10-CM | POA: Insufficient documentation

## 2022-06-25 NOTE — Addendum Note (Signed)
Addended by: Janith Lima on: 06/25/2022 11:07 AM   Modules accepted: Orders

## 2022-06-27 ENCOUNTER — Other Ambulatory Visit: Payer: Self-pay | Admitting: Internal Medicine

## 2022-06-27 DIAGNOSIS — J418 Mixed simple and mucopurulent chronic bronchitis: Secondary | ICD-10-CM

## 2022-07-04 ENCOUNTER — Ambulatory Visit
Admission: RE | Admit: 2022-07-04 | Discharge: 2022-07-04 | Disposition: A | Discharge status: Home / Self Care | Payer: Medicare PPO | Source: Ambulatory Visit | Attending: Internal Medicine | Admitting: Internal Medicine

## 2022-07-04 DIAGNOSIS — R911 Solitary pulmonary nodule: Secondary | ICD-10-CM | POA: Diagnosis not present

## 2022-07-04 DIAGNOSIS — R079 Chest pain, unspecified: Secondary | ICD-10-CM

## 2022-07-04 DIAGNOSIS — J439 Emphysema, unspecified: Secondary | ICD-10-CM | POA: Diagnosis not present

## 2022-07-04 DIAGNOSIS — R918 Other nonspecific abnormal finding of lung field: Secondary | ICD-10-CM

## 2022-07-04 DIAGNOSIS — R0789 Other chest pain: Secondary | ICD-10-CM

## 2022-07-04 DIAGNOSIS — J9611 Chronic respiratory failure with hypoxia: Secondary | ICD-10-CM

## 2022-07-04 DIAGNOSIS — J449 Chronic obstructive pulmonary disease, unspecified: Secondary | ICD-10-CM | POA: Diagnosis not present

## 2022-07-11 DIAGNOSIS — J449 Chronic obstructive pulmonary disease, unspecified: Secondary | ICD-10-CM | POA: Diagnosis not present

## 2022-07-18 ENCOUNTER — Other Ambulatory Visit: Payer: Self-pay | Admitting: Internal Medicine

## 2022-07-18 DIAGNOSIS — J418 Mixed simple and mucopurulent chronic bronchitis: Secondary | ICD-10-CM

## 2022-08-10 DIAGNOSIS — J449 Chronic obstructive pulmonary disease, unspecified: Secondary | ICD-10-CM | POA: Diagnosis not present

## 2022-08-29 ENCOUNTER — Other Ambulatory Visit: Payer: Self-pay | Admitting: Internal Medicine

## 2022-08-29 DIAGNOSIS — E7800 Pure hypercholesterolemia, unspecified: Secondary | ICD-10-CM

## 2022-09-02 DIAGNOSIS — H401131 Primary open-angle glaucoma, bilateral, mild stage: Secondary | ICD-10-CM | POA: Diagnosis not present

## 2022-09-10 DIAGNOSIS — J449 Chronic obstructive pulmonary disease, unspecified: Secondary | ICD-10-CM | POA: Diagnosis not present

## 2022-09-12 DIAGNOSIS — D1801 Hemangioma of skin and subcutaneous tissue: Secondary | ICD-10-CM | POA: Diagnosis not present

## 2022-09-12 DIAGNOSIS — Z85828 Personal history of other malignant neoplasm of skin: Secondary | ICD-10-CM | POA: Diagnosis not present

## 2022-09-12 DIAGNOSIS — D485 Neoplasm of uncertain behavior of skin: Secondary | ICD-10-CM | POA: Diagnosis not present

## 2022-09-12 DIAGNOSIS — D3611 Benign neoplasm of peripheral nerves and autonomic nervous system of face, head, and neck: Secondary | ICD-10-CM | POA: Diagnosis not present

## 2022-09-12 DIAGNOSIS — L821 Other seborrheic keratosis: Secondary | ICD-10-CM | POA: Diagnosis not present

## 2022-09-12 DIAGNOSIS — L820 Inflamed seborrheic keratosis: Secondary | ICD-10-CM | POA: Diagnosis not present

## 2022-09-12 DIAGNOSIS — L570 Actinic keratosis: Secondary | ICD-10-CM | POA: Diagnosis not present

## 2022-09-12 DIAGNOSIS — L918 Other hypertrophic disorders of the skin: Secondary | ICD-10-CM | POA: Diagnosis not present

## 2022-09-12 DIAGNOSIS — D225 Melanocytic nevi of trunk: Secondary | ICD-10-CM | POA: Diagnosis not present

## 2022-09-26 ENCOUNTER — Other Ambulatory Visit: Payer: Self-pay | Admitting: Internal Medicine

## 2022-09-26 DIAGNOSIS — J418 Mixed simple and mucopurulent chronic bronchitis: Secondary | ICD-10-CM

## 2022-09-26 DIAGNOSIS — J9611 Chronic respiratory failure with hypoxia: Secondary | ICD-10-CM

## 2022-09-26 DIAGNOSIS — F3342 Major depressive disorder, recurrent, in full remission: Secondary | ICD-10-CM

## 2022-10-10 DIAGNOSIS — D485 Neoplasm of uncertain behavior of skin: Secondary | ICD-10-CM | POA: Diagnosis not present

## 2022-10-10 DIAGNOSIS — L988 Other specified disorders of the skin and subcutaneous tissue: Secondary | ICD-10-CM | POA: Diagnosis not present

## 2022-10-10 DIAGNOSIS — J449 Chronic obstructive pulmonary disease, unspecified: Secondary | ICD-10-CM | POA: Diagnosis not present

## 2022-10-28 ENCOUNTER — Other Ambulatory Visit: Payer: Self-pay | Admitting: Internal Medicine

## 2022-10-28 DIAGNOSIS — J418 Mixed simple and mucopurulent chronic bronchitis: Secondary | ICD-10-CM

## 2022-10-28 DIAGNOSIS — J9611 Chronic respiratory failure with hypoxia: Secondary | ICD-10-CM

## 2022-11-10 DIAGNOSIS — J449 Chronic obstructive pulmonary disease, unspecified: Secondary | ICD-10-CM | POA: Diagnosis not present

## 2022-11-12 DIAGNOSIS — Z681 Body mass index (BMI) 19 or less, adult: Secondary | ICD-10-CM | POA: Diagnosis not present

## 2022-11-12 DIAGNOSIS — H1031 Unspecified acute conjunctivitis, right eye: Secondary | ICD-10-CM | POA: Diagnosis not present

## 2022-11-23 ENCOUNTER — Other Ambulatory Visit: Payer: Self-pay | Admitting: Internal Medicine

## 2022-11-23 DIAGNOSIS — E7800 Pure hypercholesterolemia, unspecified: Secondary | ICD-10-CM

## 2022-12-11 DIAGNOSIS — J449 Chronic obstructive pulmonary disease, unspecified: Secondary | ICD-10-CM | POA: Diagnosis not present

## 2022-12-19 ENCOUNTER — Ambulatory Visit: Payer: Medicare PPO | Admitting: Internal Medicine

## 2022-12-19 ENCOUNTER — Ambulatory Visit (INDEPENDENT_AMBULATORY_CARE_PROVIDER_SITE_OTHER): Payer: Medicare PPO

## 2022-12-19 ENCOUNTER — Encounter: Payer: Self-pay | Admitting: Internal Medicine

## 2022-12-19 VITALS — BP 118/62 | HR 100 | Temp 98.3°F | Resp 16 | Ht 66.0 in | Wt 121.0 lb

## 2022-12-19 DIAGNOSIS — R000 Tachycardia, unspecified: Secondary | ICD-10-CM | POA: Diagnosis not present

## 2022-12-19 DIAGNOSIS — J9611 Chronic respiratory failure with hypoxia: Secondary | ICD-10-CM | POA: Diagnosis not present

## 2022-12-19 DIAGNOSIS — Z23 Encounter for immunization: Secondary | ICD-10-CM | POA: Diagnosis not present

## 2022-12-19 DIAGNOSIS — J418 Mixed simple and mucopurulent chronic bronchitis: Secondary | ICD-10-CM

## 2022-12-19 DIAGNOSIS — R0602 Shortness of breath: Secondary | ICD-10-CM

## 2022-12-19 DIAGNOSIS — J441 Chronic obstructive pulmonary disease with (acute) exacerbation: Secondary | ICD-10-CM | POA: Insufficient documentation

## 2022-12-19 DIAGNOSIS — J22 Unspecified acute lower respiratory infection: Secondary | ICD-10-CM | POA: Diagnosis not present

## 2022-12-19 LAB — CBC WITH DIFFERENTIAL/PLATELET
Basophils Absolute: 0 10*3/uL (ref 0.0–0.1)
Basophils Relative: 0.4 % (ref 0.0–3.0)
Eosinophils Absolute: 0 10*3/uL (ref 0.0–0.7)
Eosinophils Relative: 0.1 % (ref 0.0–5.0)
HCT: 42.9 % (ref 39.0–52.0)
Hemoglobin: 14.1 g/dL (ref 13.0–17.0)
Lymphocytes Relative: 12.9 % (ref 12.0–46.0)
Lymphs Abs: 1.7 10*3/uL (ref 0.7–4.0)
MCHC: 32.8 g/dL (ref 30.0–36.0)
MCV: 93.8 fl (ref 78.0–100.0)
Monocytes Absolute: 1.1 10*3/uL — ABNORMAL HIGH (ref 0.1–1.0)
Monocytes Relative: 8.3 % (ref 3.0–12.0)
Neutro Abs: 10.1 10*3/uL — ABNORMAL HIGH (ref 1.4–7.7)
Neutrophils Relative %: 78.3 % — ABNORMAL HIGH (ref 43.0–77.0)
Platelets: 312 10*3/uL (ref 150.0–400.0)
RBC: 4.58 Mil/uL (ref 4.22–5.81)
RDW: 12.8 % (ref 11.5–15.5)
WBC: 12.9 10*3/uL — ABNORMAL HIGH (ref 4.0–10.5)

## 2022-12-19 LAB — TROPONIN I (HIGH SENSITIVITY): High Sens Troponin I: 11 ng/L (ref 2–17)

## 2022-12-19 LAB — BASIC METABOLIC PANEL
BUN: 11 mg/dL (ref 6–23)
CO2: 29 meq/L (ref 19–32)
Calcium: 9.6 mg/dL (ref 8.4–10.5)
Chloride: 98 meq/L (ref 96–112)
Creatinine, Ser: 0.77 mg/dL (ref 0.40–1.50)
GFR: 86.19 mL/min (ref >60.00)
Glucose, Bld: 122 mg/dL — ABNORMAL HIGH (ref 70–99)
Potassium: 4 meq/L (ref 3.5–5.1)
Sodium: 135 meq/L (ref 135–145)

## 2022-12-19 LAB — TSH: TSH: 2.21 u[IU]/mL (ref 0.35–5.50)

## 2022-12-19 LAB — BRAIN NATRIURETIC PEPTIDE: Pro B Natriuretic peptide (BNP): 46 pg/mL (ref 0.0–100.0)

## 2022-12-19 LAB — D-DIMER, QUANTITATIVE: D-Dimer, Quant: 0.4 ug{FEU}/mL (ref <0.50)

## 2022-12-19 MED ORDER — METHYLPREDNISOLONE ACETATE 80 MG/ML IJ SUSP
80.0000 mg | Freq: Once | INTRAMUSCULAR | Status: AC
Start: 2022-12-19 — End: 2022-12-19
  Administered 2022-12-19: 80 mg via INTRAMUSCULAR

## 2022-12-19 NOTE — Progress Notes (Unsigned)
Subjective:  Patient ID: Joshua Cross, male    DOB: 08/02/1945  Age: 77 y.o. MRN: [id number]  CC: COPD   HPI Joshua Cross presents for f/up ----  Discussed the use of AI scribe software for clinical note transcription with the patient, who gave verbal consent to proceed.  History of Present Illness   The patient initially presented with muscular aches four days ago, which has since been accompanied by progressive shortness of breath. The patient reported that the shortness of breath has been worsening daily, to the point where activities such as showering have become almost impossible. Despite these symptoms, the patient denied experiencing any chest pain, cough, fever, chills, night sweats, or swollen lymph nodes.       Outpatient Medications Prior to Visit  Medication Sig Dispense Refill   Acetylcarnitine HCl (ACETYL L-CARNITINE PO) Take 750 mg by mouth daily. 750 MG DAILY     ASHWAGANDHA PO Take 3,000 mcg by mouth daily. 3000 MCG DAILY     Biotin 1 MG CAPS Take 1 mg by mouth daily.     budesonide (PULMICORT) 0.5 MG/2ML nebulizer solution INHALE 2 ML( 1 VIAL) USING NEBULIZER IN THE MORNING AND AT BEDTIME 360 mL 1   Cholecalciferol (VITAMIN D) 50 MCG (2000 UT) CAPS Take 2,000 Units by mouth daily.     Coenzyme Q10 (CO Q 10 PO) Take 1 capsule by mouth daily.     formoterol (PERFOROMIST) 20 MCG/2ML nebulizer solution INHALE 1 VIAL VIA NEBULIZER TWICE DAILY 360 mL 0   formoterol (PERFOROMIST) 20 MCG/2ML nebulizer solution USE 1 VIAL VIA NEBULIZER TWICE DAILY 360 mL 0   ipratropium (ATROVENT) 0.03 % nasal spray INSTILL 2 SPRAYS INTO EACH NOSTRIL EVERY 12 HOURS (Patient taking differently: Place 2 sprays into both nostrils 2 (two) times daily as needed for rhinitis.) 30 mL 5   Melatonin 10 MG CAPS Take 10 mg by mouth at bedtime.     Multiple Vitamin (MULTIVITAMIN) tablet Take 1 tablet by mouth daily. CENTRUM SILVER DAILY     OVER THE COUNTER MEDICATION Take 2,400 mcg by mouth  in the morning and at bedtime. MUSCADINE SEED 2400 MCG 2 DAILY     PARoxetine (PAXIL) 20 MG tablet TAKE ONE TABLET BY MOUTH ONCE DAILY 90 tablet 1   Probiotic Product (PROBIOTIC ADVANCED PO) Take 1 capsule by mouth daily. From deep roots     roflumilast (DALIRESP) 500 MCG TABS tablet TAKE ONE TABLET BY MOUTH EVERY DAY 90 tablet 1   rosuvastatin (CRESTOR) 10 MG tablet TAKE ONE TABLET EVERY DAY 90 tablet 0   Sod Fluoride-Potassium Nitrate (PREVIDENT 5000 SENSITIVE) 1.1-5 % GEL PreviDent 5000 Enamel Protect 1.1 %-5 % dental paste     TART CHERRY PO Take 2,400 mcg by mouth daily. 2400 MCG DAILY     YUPELRI 175 MCG/3ML nebulizer solution INHALE 1 VIAL(3 ML) INTO THE LUNGS USING NEBULIZER DAILY 270 mL 1   No facility-administered medications prior to visit.    ROS Review of Systems  Respiratory:  Positive for shortness of breath. Negative for cough.   Musculoskeletal:  Positive for myalgias.  Hematological:  Negative for adenopathy. Does not bruise/bleed easily.  Psychiatric/Behavioral: Negative.      Objective:  BP 118/62 (BP Location: Left Arm, Patient Position: Sitting, Cuff Size: Normal)   Pulse 100   Temp 98.3 F (36.8 C) (Oral)   Resp 16   Ht 5\' 6"  (1.676 m)   Wt 121 lb (54.9 kg)   SpO2  95%   BMI 19.53 kg/m   BP Readings from Last 3 Encounters:  12/19/22 118/62  06/23/22 118/64  05/24/22 118/68    Wt Readings from Last 3 Encounters:  12/19/22 121 lb (54.9 kg)  06/23/22 122 lb (55.3 kg)  05/24/22 124 lb (56.2 kg)    Physical Exam Cardiovascular:     Rate and Rhythm: Regular rhythm. Tachycardia present.     Comments: EKG - NSR, 98 bpm BAE ++artifact NS ST changes    Lab Results  Component Value Date   WBC 8.7 06/23/2022   HGB 14.8 06/23/2022   HCT 43.0 06/23/2022   PLT 322.0 06/23/2022   GLUCOSE 95 11/24/2021   CHOL 198 11/24/2021   TRIG 110.0 11/24/2021   HDL 100.10 11/24/2021   LDLCALC 76 11/24/2021   ALT 16 11/24/2021   AST 13 11/24/2021   NA 135  11/24/2021   K 4.5 11/24/2021   CL 97 11/24/2021   CREATININE 0.67 11/24/2021   BUN 13 11/24/2021   CO2 31 11/24/2021   TSH 2.48 11/24/2021   PSA 0.88 11/24/2021   HGBA1C 5.9 (H) 11/13/2019   MICROALBUR 0.2 11/13/2019    CT CHEST WO CONTRAST  Result Date: 07/06/2022 CLINICAL DATA:  Chest pain, nonspecific. Emphysema, COPD, right lobectomy. History of tobacco use. EXAM: CT CHEST WITHOUT CONTRAST TECHNIQUE: Multidetector CT imaging of the chest was performed following the standard protocol without IV contrast. RADIATION DOSE REDUCTION: This exam was performed according to the departmental dose-optimization program which includes automated exposure control, adjustment of the mA and/or kV according to patient size and/or use of iterative reconstruction technique. COMPARISON:  12/06/2016. FINDINGS: Cardiovascular: Heart is normal in size and there is no pericardial effusion. Scattered coronary artery calcifications are noted. There is atherosclerotic calcification of the aorta without evidence of aneurysm. The pulmonary trunk is normal in caliber. Mediastinum/Nodes: No mediastinal or axillary lymphadenopathy. Evaluation of the hilum is limited due to lack of IV contrast. The thyroid gland, trachea, and esophagus are within normal limits. Lungs/Pleura: Severe emphysematous changes are noted in the lungs. Postsurgical changes are present in the anterior aspect of the right lung. There is a 6 mm nodule in the right lower lobe, axial image 134, unchanged from 2018. Stable scarring is noted at the left lung apex. No effusion or pneumothorax. Upper Abdomen: No acute abnormality. Musculoskeletal: Mild gynecomastia is noted bilaterally. Degenerative changes are present in the thoracic spine. No acute or suspicious osseous abnormality. IMPRESSION: 1. Stable right lower lobe 6 mm pulmonary nodule, unchanged from 2018 and likely benign. 2. Severe emphysema. 3. Scattered coronary artery calcifications. 4. Aortic  atherosclerosis. Electronically Signed   By: Thornell Sartorius M.D.   On: 07/06/2022 22:16   DG Chest 2 View  Result Date: 12/19/2022 CLINICAL DATA:  Worsening shortness of breath EXAM: CHEST - 2 VIEW COMPARISON:  06/23/2022 FINDINGS: Pulmonary hyperinflation with attenuated peripheral markings, prominent perihilar vascular markings. Surgical staples medially in the right upper lung. No new infiltrate or overt edema. Heart size and mediastinal contours are within normal limits. No effusion. Visualized bones unremarkable. IMPRESSION: No acute cardiopulmonary disease. Electronically Signed   By: Corlis Leak M.D.   On: 12/19/2022 09:50     Assessment & Plan:  Flu vaccine need -     Flu Vaccine Trivalent High Dose (Fluad)  Shortness of breath -     DG Chest 2 View; Future -     EKG 12-Lead -     CBC with Differential/Platelet; Future -  Brain natriuretic peptide; Future -     Troponin I (High Sensitivity); Future -     D-dimer, quantitative; Future  Mixed simple and mucopurulent chronic bronchitis (HCC) -     Basic metabolic panel; Future -     methylPREDNISolone Acetate  Chronic respiratory failure with hypoxia (HCC) -     Basic metabolic panel; Future  Tachycardia -     Basic metabolic panel; Future -     CBC with Differential/Platelet; Future -     TSH; Future  COPD with acute exacerbation (HCC)     Follow-up: No follow-ups on file.  Sanda Linger, MD

## 2022-12-21 DIAGNOSIS — J22 Unspecified acute lower respiratory infection: Secondary | ICD-10-CM | POA: Insufficient documentation

## 2022-12-21 MED ORDER — AZITHROMYCIN 500 MG PO TABS
500.0000 mg | ORAL_TABLET | Freq: Every day | ORAL | 0 refills | Status: DC
Start: 2022-12-21 — End: 2023-05-29

## 2022-12-22 ENCOUNTER — Other Ambulatory Visit: Payer: Self-pay | Admitting: Internal Medicine

## 2022-12-22 DIAGNOSIS — J22 Unspecified acute lower respiratory infection: Secondary | ICD-10-CM

## 2022-12-22 MED ORDER — DOXYCYCLINE HYCLATE 100 MG PO TABS: 100.0000 mg | ORAL_TABLET | Freq: Two times a day (BID) | ORAL | 0 refills | Status: AC

## 2022-12-28 ENCOUNTER — Other Ambulatory Visit: Payer: Self-pay | Admitting: Internal Medicine

## 2022-12-28 DIAGNOSIS — J418 Mixed simple and mucopurulent chronic bronchitis: Secondary | ICD-10-CM

## 2023-01-02 ENCOUNTER — Ambulatory Visit: Payer: Medicare PPO | Admitting: Internal Medicine

## 2023-01-10 DIAGNOSIS — J449 Chronic obstructive pulmonary disease, unspecified: Secondary | ICD-10-CM | POA: Diagnosis not present

## 2023-01-16 ENCOUNTER — Other Ambulatory Visit (HOSPITAL_BASED_OUTPATIENT_CLINIC_OR_DEPARTMENT_OTHER): Payer: Self-pay

## 2023-01-16 MED ORDER — COVID-19 MRNA VAC-TRIS(PFIZER) 30 MCG/0.3ML IM SUSY
0.3000 mL | PREFILLED_SYRINGE | Freq: Once | INTRAMUSCULAR | 0 refills | Status: AC
  Filled 2023-01-16: qty 0.3, 1d supply, fill #0

## 2023-02-10 DIAGNOSIS — J449 Chronic obstructive pulmonary disease, unspecified: Secondary | ICD-10-CM | POA: Diagnosis not present

## 2023-02-27 ENCOUNTER — Other Ambulatory Visit: Payer: Self-pay | Admitting: Internal Medicine

## 2023-02-27 DIAGNOSIS — E7800 Pure hypercholesterolemia, unspecified: Secondary | ICD-10-CM

## 2023-03-12 DIAGNOSIS — J449 Chronic obstructive pulmonary disease, unspecified: Secondary | ICD-10-CM | POA: Diagnosis not present

## 2023-03-30 ENCOUNTER — Other Ambulatory Visit: Payer: Self-pay | Admitting: Internal Medicine

## 2023-03-30 DIAGNOSIS — J418 Mixed simple and mucopurulent chronic bronchitis: Secondary | ICD-10-CM

## 2023-03-30 DIAGNOSIS — J9611 Chronic respiratory failure with hypoxia: Secondary | ICD-10-CM

## 2023-03-31 ENCOUNTER — Ambulatory Visit (INDEPENDENT_AMBULATORY_CARE_PROVIDER_SITE_OTHER): Payer: Medicare PPO

## 2023-03-31 ENCOUNTER — Ambulatory Visit: Payer: Medicare PPO | Admitting: Internal Medicine

## 2023-03-31 DIAGNOSIS — Z0000 Encounter for general adult medical examination without abnormal findings: Secondary | ICD-10-CM

## 2023-03-31 NOTE — Patient Instructions (Signed)
Joshua Cross , Thank you for taking time to come for your Medicare Wellness Visit. I appreciate your ongoing commitment to your health goals. Please review the following plan we discussed and let me know if I can assist you in the future.   Referrals/Orders/Follow-Ups/Clinician Recommendations: none  This is a list of the screening recommended for you and due dates:  Health Maintenance  Topic Date Due   Eye exam for diabetics  04/19/2019   Hemoglobin A1C  05/15/2020   Complete foot exam   11/13/2020   Zoster (Shingles) Vaccine (2 of 2) 03/08/2022   COVID-19 Vaccine (6 - 2024-25 season) 03/13/2023   Yearly kidney health urinalysis for diabetes  12/19/2027*   Colon Cancer Screening  11/13/2023   Yearly kidney function blood test for diabetes  12/19/2023   Medicare Annual Wellness Visit  03/30/2024   DTaP/Tdap/Td vaccine (3 - Td or Tdap) 03/07/2026   Pneumonia Vaccine  Completed   Flu Shot  Completed   Hepatitis C Screening  Completed   HPV Vaccine  Aged Out  *Topic was postponed. The date shown is not the original due date.    Advanced directives: (In Chart) A copy of your advanced directives are scanned into your chart should your provider ever need it.  Next Medicare Annual Wellness Visit scheduled for next year: Yes  insert Preventive Care attachment Insert FALL PREVENTION attachment if needed

## 2023-03-31 NOTE — Progress Notes (Signed)
Subjective:   Joshua Cross is a 77 y.o. male who presents for Medicare Annual/Subsequent preventive examination.  Visit Complete: Virtual I connected with  Joshua Cross on 03/31/23 by a audio enabled telemedicine application and verified that I am speaking with the correct person using two identifiers.  Patient Location: Home  Provider Location: Office/Clinic  I discussed the limitations of evaluation and management by telemedicine. The patient expressed understanding and agreed to proceed.  Vital Signs: Because this visit was a virtual/telehealth visit, some criteria may be missing or patient reported. Any vitals not documented were not able to be obtained and vitals that have been documented are patient reported.  Patient Medicare AWV questionnaire was completed by the patient on 03/27/2023; I have confirmed that all information answered by patient is correct and no changes since this date.  Cardiac Risk Factors include: advanced age (>70men, >1 women);dyslipidemia;male gender     Objective:    Today's Vitals   There is no height or weight on file to calculate BMI.     03/31/2023    1:11 PM 03/21/2022    2:19 PM 03/18/2021    2:01 PM 11/12/2020    8:17 AM 01/05/2020    9:40 PM 11/25/2019    1:47 PM 11/21/2018    1:10 PM  Advanced Directives  Does Patient Have a Medical Advance Directive? Yes Yes Yes Yes No Yes Yes  Type of Estate agent of [place];Living will Healthcare Power of [place];Living will  Healthcare Power of [place];Living will  Healthcare Power of [place];Living will Healthcare Power of [place];Living will  Does patient want to make changes to medical advance directive?  No - Patient declined No - Patient declined      Copy of Healthcare Power of Attorney in Chart? Yes - validated most recent copy scanned in chart (See row information) Yes - validated most recent copy scanned in chart (See row information)  No - copy requested   Yes - validated most recent copy scanned in chart (See row information) No - copy requested  Would patient like information on creating a medical advance directive?     No - Patient declined      Current Medications (verified) Outpatient Encounter Medications as of 03/31/2023  Medication Sig   Acetylcarnitine HCl (ACETYL L-CARNITINE PO) Take 750 mg by mouth daily. 750 MG DAILY   ASHWAGANDHA PO Take 3,000 mcg by mouth daily. 3000 MCG DAILY   Biotin 1 MG CAPS Take 1 mg by mouth daily.   budesonide (PULMICORT) 0.5 MG/2ML nebulizer solution INHALE USING NEBULIZER IN THE MORNING AND AT BEDTIME   Cholecalciferol (VITAMIN D) 50 MCG (2000 UT) CAPS Take 2,000 Units by mouth daily.   Coenzyme Q10 (CO Q 10 PO) Take 1 capsule by mouth daily.   formoterol (PERFOROMIST) 20 MCG/2ML nebulizer solution INHALE 1 VIAL VIA NEBULIZER TWICE DAILY   formoterol (PERFOROMIST) 20 MCG/2ML nebulizer solution INHALE 1 VIAL INTO THE LUNGS USING NEBULIZER TWICE DAILY   Melatonin 10 MG CAPS Take 10 mg by mouth at bedtime.   Multiple Vitamin (MULTIVITAMIN) tablet Take 1 tablet by mouth daily. CENTRUM SILVER DAILY   OVER THE COUNTER MEDICATION Take 2,400 mcg by mouth in the morning and at bedtime. MUSCADINE SEED 2400 MCG 2 DAILY   PARoxetine (PAXIL) 20 MG tablet TAKE ONE TABLET BY MOUTH ONCE DAILY   Probiotic Product (PROBIOTIC ADVANCED PO) Take 1 capsule by mouth daily. From deep roots   roflumilast (DALIRESP) 500 MCG TABS  tablet TAKE ONE TABLET BY MOUTH EVERY DAY   rosuvastatin (CRESTOR) 10 MG tablet TAKE ONE TABLET EVERY DAY   Sod Fluoride-Potassium Nitrate (PREVIDENT 5000 SENSITIVE) 1.1-5 % GEL PreviDent 5000 Enamel Protect 1.1 %-5 % dental paste   TART CHERRY PO Take 2,400 mcg by mouth daily. 2400 MCG DAILY   YUPELRI 175 MCG/3ML nebulizer solution INHALE 1 VIAL(3 ML) INTO THE LUNGS USING NEBULIZER DAILY   azithromycin (ZITHROMAX) 500 MG tablet Take 1 tablet (500 mg total) by mouth daily. (Patient not taking:  Reported on 03/31/2023)   ipratropium (ATROVENT) 0.03 % nasal spray INSTILL 2 SPRAYS INTO EACH NOSTRIL EVERY 12 HOURS (Patient taking differently: Place 2 sprays into both nostrils 2 (two) times daily as needed for rhinitis.)   No facility-administered encounter medications on file as of 03/31/2023.    Allergies (verified) Erythromycin and Penicillins   History: Past Medical History:  Diagnosis Date   Allergy    MILD    Anxiety    MILD    Arthritis    Blood transfusion without reported diagnosis    Cataract    REMOVED BOTH EYES    Colon polyp    COPD (chronic obstructive pulmonary disease) (HCC)    Emphysema of lung (HCC)    Hyperlipidemia    Oxygen deficiency    2.5 -3 LITERS - USES DAY AND NIGHT AND USES OFTEN    Past Surgical History:  Procedure Laterality Date   cataracts Bilateral 2019   COLONOSCOPY     COLONOSCOPY WITH PROPOFOL N/A 11/12/2020   Procedure: COLONOSCOPY WITH PROPOFOL;  Surgeon: Hilarie Fredrickson, MD;  Location: [facility];  Service: Endoscopy;  Laterality: N/A;   HERNIA REPAIR     LUNG REMOVAL, PARTIAL Right 1985   POLYPECTOMY     POLYPECTOMY  11/12/2020   Procedure: POLYPECTOMY;  Surgeon: Hilarie Fredrickson, MD;  Location: [facility];  Service: Endoscopy;;   TONSILLECTOMY     Family History  Problem Relation Age of Onset   Alcohol abuse Mother    Stroke Father    Depression Sister    Heart disease Sister    Cancer Neg Hx    COPD Neg Hx    Early death Neg Hx    Kidney disease Neg Hx    Hypertension Neg Hx    Learning disabilities Neg Hx    Colon cancer Neg Hx    Colon polyps Neg Hx    Esophageal cancer Neg Hx    Rectal cancer Neg Hx    Stomach cancer Neg Hx    Social History   Socioeconomic History   Marital status: Divorced    Spouse name: Not on file   Number of children: Not on file   Years of education: Not on file   Highest education level: Not on file  Occupational History   Occupation: retired  Tobacco Use   Smoking status: Former     Current packs/day: 0.00    Average packs/day: 0.3 packs/day for 50.0 years (12.5 ttl pk-yrs)    Types: Cigarettes    Start date: 08/07/1954    Quit date: 08/06/2004    Years since quitting: 18.6   Smokeless tobacco: Never  Vaping Use   Vaping status: Never Used  Substance and Sexual Activity   Alcohol use: No   Drug use: No   Sexual activity: Not Currently  Other Topics Concern   Not on file  Social History Narrative   Patient is from [place], moved to [place] to  attend school at [employer], then started work [employer] studies at college in [place]. 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 He is an avid reader and active in [employer].   Social Drivers of Corporate investment banker Strain: Low Risk  (03/27/2023)   Overall Financial Resource Strain (CARDIA)    Difficulty of Paying Living Expenses: Not very hard  Food Insecurity: No Food Insecurity (03/27/2023)   Hunger Vital Sign    Worried About Running Out of Food in the Last Year: Never true    Ran Out of Food in the Last Year: Never true  Transportation Needs: No Transportation Needs (03/27/2023)   PRAPARE - Administrator, Civil Service (Medical): No    Lack of Transportation (Non-Medical): No  Physical Activity: Insufficiently Active (03/27/2023)   Exercise Vital Sign    Days of Exercise per Week: 3 days    Minutes of Exercise per Session: 10 min  Stress: No Stress Concern Present (03/27/2023)   [employer] of Occupational Health - Occupational Stress Questionnaire    Feeling of Stress : Only a little  Social Connections: Unknown (03/27/2023)   Social Connection and Isolation Panel [NHANES]    Frequency of Communication with Friends and Family: More than three times a week    Frequency of Social Gatherings with Friends and Family: Twice a week    Attends Religious Services: Not on Marketing executive or Organizations: Yes    Attends Engineer, structural:  More than 4 times per year    Marital Status: Divorced    Tobacco Counseling Counseling given: Not Answered   Clinical Intake:  Pre-visit preparation completed: Yes  Pain : No/denies pain     Nutritional Risks: None Diabetes: No  How often do you need to have someone help you when you read instructions, pamphlets, or other written materials from your doctor or pharmacy?: 1 - Never  Interpreter Needed?: No  Information entered by :: NAllen LPN   Activities of Daily Living    03/27/2023   10:52 AM  In your present state of health, do you have any difficulty performing the following activities:  Hearing? 0  Vision? 0  Difficulty concentrating or making decisions? 0  Walking or climbing stairs? 0  Dressing or bathing? 0  Doing errands, shopping? 0  Preparing Food and eating ? N  Using the Toilet? N  In the past six months, have you accidently leaked urine? N  Do you have problems with loss of bowel control? N  Managing your Medications? N  Managing your Finances? N  Housekeeping or managing your Housekeeping? N    Patient Care Team: Etta Grandchild, MD as PCP - General (Internal Medicine) Hilarie Fredrickson, MD as Consulting Physician (Gastroenterology) Elwin Mocha, MD as Consulting Physician (Ophthalmology)  Indicate any recent Medical Services you may have received from other than [facility] providers in the past year (date may be approximate).     Assessment:   This is a routine wellness examination for [place].  Hearing/Vision screen Hearing Screening - Comments:: Denies hearing issues Vision Screening - Comments:: Regular eye exams,    Goals Addressed             This Visit's Progress    Patient Stated       03/31/2023, wants to maintain current health       Depression Screen    03/31/2023    1:12  PM 04/05/2022   10:49 AM 03/21/2022    2:14 PM 11/16/2021   11:15 AM 03/18/2021    2:00 PM 11/25/2019    1:45 PM 11/21/2018    1:11 PM  PHQ 2/9  Scores  PHQ - 2 Score 0 0 0 0 0 0 0  PHQ- 9 Score 0 0  1       Fall Risk    03/27/2023   10:52 AM 04/05/2022   10:49 AM 03/21/2022    2:23 PM 11/16/2021   11:15 AM 03/18/2021    2:02 PM  Fall Risk   Falls in the past year? 0 0 0 1 0  Number falls in past yr: 0  0 0 0  Injury with Fall? 0 0 0 0 0  Risk for fall due to : Medication side effect No Fall Risks No Fall Risks History of fall(s) No Fall Risks  Follow up Falls prevention discussed;Falls evaluation completed Falls evaluation completed Falls evaluation completed Falls evaluation completed Falls prevention discussed    MEDICARE RISK AT HOME: Medicare Risk at Home Any stairs in or around the home?: No Home free of loose throw rugs in walkways, pet beds, electrical cords, etc?: No Adequate lighting in your home to reduce risk of falls?: Yes Life alert?: No Use of a cane, walker or w/c?: No Grab bars in the bathroom?: No Shower chair or bench in shower?: No Elevated toilet seat or a handicapped toilet?: No  TIMED UP AND GO:  Was the test performed?  No    Cognitive Function:        03/31/2023    1:14 PM 03/21/2022    2:19 PM 11/25/2019    1:52 PM  6CIT Screen  What Year? 0 points 0 points 0 points  What month? 0 points 0 points 0 points  What time? 0 points 0 points 0 points  Count back from 20 0 points 0 points 0 points  Months in reverse 0 points 0 points 0 points  Repeat phrase 0 points 0 points 0 points  Total Score 0 points 0 points 0 points    Immunizations Immunization History  Administered Date(s) Administered   DTaP 03/07/2016   Fluad Trivalent(High Dose 65+) 12/19/2022   Influenza Split 02/01/2014, 12/31/2014, 01/24/2018   Influenza, High Dose Seasonal PF 12/06/2018   Influenza, Seasonal, Injecte, Preservative Fre 02/13/2010   Influenza-Unspecified 12/15/2016, 02/04/2018, 12/08/2018, 01/06/2021   PFIZER(Purple Top)SARS-COV-2 Vaccination 05/18/2019, 06/12/2019, 11/28/2019   Pfizer Covid-19 Vaccine  Bivalent Booster 73yrs & up 01/06/2021   Pfizer(Comirnaty)Fall Seasonal Vaccine 12 years and older 01/16/2023   Pneumococcal Conjugate-13 07/27/2013   Pneumococcal Polysaccharide-23 02/12/2010, 04/05/2022   Pneumococcal-Unspecified 03/08/2015   Tdap 05/27/2015   Zoster Recombinant(Shingrix) 01/11/2022   Zoster, Live 03/08/2011    TDAP status: Up to date  Flu Vaccine status: Up to date  Pneumococcal vaccine status: Up to date  Covid-19 vaccine status: Completed vaccines  Qualifies for Shingles Vaccine? Yes   Zostavax completed Yes   Shingrix Completed?: Yes  Screening Tests Health Maintenance  Topic Date Due   OPHTHALMOLOGY EXAM  04/19/2019   HEMOGLOBIN A1C  05/15/2020   FOOT EXAM  11/13/2020   Zoster Vaccines- Shingrix (2 of 2) 03/08/2022   COVID-19 Vaccine (6 - 2024-25 season) 03/13/2023   Diabetic kidney evaluation - Urine ACR  12/19/2027 (Originally 11/12/2020)   Colonoscopy  11/13/2023   Diabetic kidney evaluation - eGFR measurement  12/19/2023   Medicare Annual Wellness (AWV)  03/30/2024   DTaP/Tdap/Td (3 -  Td or Tdap) 03/07/2026   Pneumonia Vaccine 26+ Years old  Completed   INFLUENZA VACCINE  Completed   Hepatitis C Screening  Completed   HPV VACCINES  Aged Out    Health Maintenance  Health Maintenance Due  Topic Date Due   OPHTHALMOLOGY EXAM  04/19/2019   HEMOGLOBIN A1C  05/15/2020   FOOT EXAM  11/13/2020   Zoster Vaccines- Shingrix (2 of 2) 03/08/2022   COVID-19 Vaccine (6 - 2024-25 season) 03/13/2023    Colorectal cancer screening: No longer required.   Lung Cancer Screening: (Low Dose CT Chest recommended if Age 86-80 years, 20 pack-year currently smoking OR have quit w/in 15years.) does not qualify.   Lung Cancer Screening Referral: no  Additional Screening:  Hepatitis C Screening: does qualify; Completed 05/10/2017  Vision Screening: Recommended annual ophthalmology exams for early detection of glaucoma and other disorders of the eye. Is the  patient up to date with their annual eye exam?  Yes  Who is the provider or what is the name of the office in which the patient attends annual eye exams? Going to new eye doctor If pt is not established with a provider, would they like to be referred to a provider to establish care? No .   Dental Screening: Recommended annual dental exams for proper oral hygiene  Diabetic Foot Exam: n/a  Community Resource Referral / Chronic Care Management: CRR required this visit?  No   CCM required this visit?  No     Plan:     I have personally reviewed and noted the following in the patient's chart:   Medical and social history Use of alcohol, tobacco or illicit drugs  Current medications and supplements including opioid prescriptions. Patient is not currently taking opioid prescriptions. Functional ability and status Nutritional status Physical activity Advanced directives List of other physicians Hospitalizations, surgeries, and ER visits in previous 12 months Vitals Screenings to include cognitive, depression, and falls Referrals and appointments  In addition, I have reviewed and discussed with patient certain preventive protocols, quality metrics, and best practice recommendations. A written personalized care plan for preventive services as well as general preventive health recommendations were provided to patient.     Barb Merino, LPN   16/01/[id number]   After Visit Summary: (MyChart) Due to this being a telephonic visit, the after visit summary with patients personalized plan was offered to patient via MyChart   Nurse Notes: none

## 2023-04-03 ENCOUNTER — Other Ambulatory Visit: Payer: Self-pay | Admitting: Internal Medicine

## 2023-04-03 DIAGNOSIS — F3342 Major depressive disorder, recurrent, in full remission: Secondary | ICD-10-CM

## 2023-04-12 DIAGNOSIS — J449 Chronic obstructive pulmonary disease, unspecified: Secondary | ICD-10-CM | POA: Diagnosis not present

## 2023-05-13 DIAGNOSIS — J449 Chronic obstructive pulmonary disease, unspecified: Secondary | ICD-10-CM | POA: Diagnosis not present

## 2023-05-29 ENCOUNTER — Other Ambulatory Visit: Payer: Self-pay | Admitting: Internal Medicine

## 2023-05-29 DIAGNOSIS — J22 Unspecified acute lower respiratory infection: Secondary | ICD-10-CM

## 2023-05-29 DIAGNOSIS — J441 Chronic obstructive pulmonary disease with (acute) exacerbation: Secondary | ICD-10-CM

## 2023-05-29 MED ORDER — AZITHROMYCIN 500 MG PO TABS: 500.0000 mg | ORAL_TABLET | Freq: Every day | ORAL | 0 refills | Status: DC

## 2023-05-29 MED ORDER — PREDNISONE 50 MG PO TABS: 50.0000 mg | ORAL_TABLET | Freq: Every day | ORAL | 0 refills | Status: DC

## 2023-05-30 ENCOUNTER — Other Ambulatory Visit: Payer: Self-pay | Admitting: Internal Medicine

## 2023-05-30 DIAGNOSIS — J441 Chronic obstructive pulmonary disease with (acute) exacerbation: Secondary | ICD-10-CM

## 2023-05-30 DIAGNOSIS — J22 Unspecified acute lower respiratory infection: Secondary | ICD-10-CM

## 2023-05-30 MED ORDER — AZITHROMYCIN 500 MG PO TABS: 500.0000 mg | ORAL_TABLET | Freq: Every day | ORAL | 0 refills | Status: DC

## 2023-05-30 MED ORDER — PREDNISONE 50 MG PO TABS
50.0000 mg | ORAL_TABLET | Freq: Every day | ORAL | 0 refills | Status: AC
Start: 2023-05-30 — End: 2023-06-04

## 2023-06-05 ENCOUNTER — Other Ambulatory Visit: Payer: Self-pay | Admitting: Internal Medicine

## 2023-06-05 DIAGNOSIS — E7800 Pure hypercholesterolemia, unspecified: Secondary | ICD-10-CM

## 2023-06-09 ENCOUNTER — Other Ambulatory Visit: Payer: Self-pay | Admitting: Internal Medicine

## 2023-06-09 DIAGNOSIS — E7800 Pure hypercholesterolemia, unspecified: Secondary | ICD-10-CM

## 2023-06-09 MED ORDER — ROSUVASTATIN CALCIUM 10 MG PO TABS: 10.0000 mg | ORAL_TABLET | Freq: Every day | ORAL | 0 refills | Status: DC

## 2023-06-10 DIAGNOSIS — J449 Chronic obstructive pulmonary disease, unspecified: Secondary | ICD-10-CM | POA: Diagnosis not present

## 2023-06-23 ENCOUNTER — Other Ambulatory Visit: Payer: Self-pay | Admitting: Internal Medicine

## 2023-06-23 DIAGNOSIS — J418 Mixed simple and mucopurulent chronic bronchitis: Secondary | ICD-10-CM

## 2023-07-03 ENCOUNTER — Other Ambulatory Visit: Payer: Self-pay | Admitting: Internal Medicine

## 2023-07-03 DIAGNOSIS — J418 Mixed simple and mucopurulent chronic bronchitis: Secondary | ICD-10-CM

## 2023-07-03 DIAGNOSIS — F3342 Major depressive disorder, recurrent, in full remission: Secondary | ICD-10-CM

## 2023-07-03 MED ORDER — PAROXETINE HCL 20 MG PO TABS: 20.0000 mg | ORAL_TABLET | Freq: Every day | ORAL | 0 refills | Status: DC

## 2023-07-03 MED ORDER — ROFLUMILAST 500 MCG PO TABS: 500.0000 ug | ORAL_TABLET | Freq: Every day | ORAL | 1 refills | Status: DC

## 2023-07-11 DIAGNOSIS — J449 Chronic obstructive pulmonary disease, unspecified: Secondary | ICD-10-CM | POA: Diagnosis not present

## 2023-07-25 ENCOUNTER — Other Ambulatory Visit: Payer: Self-pay | Admitting: Internal Medicine

## 2023-07-25 DIAGNOSIS — J9611 Chronic respiratory failure with hypoxia: Secondary | ICD-10-CM

## 2023-07-25 DIAGNOSIS — J418 Mixed simple and mucopurulent chronic bronchitis: Secondary | ICD-10-CM

## 2023-08-09 ENCOUNTER — Other Ambulatory Visit: Payer: Self-pay | Admitting: Internal Medicine

## 2023-08-09 DIAGNOSIS — J9611 Chronic respiratory failure with hypoxia: Secondary | ICD-10-CM

## 2023-08-09 DIAGNOSIS — J418 Mixed simple and mucopurulent chronic bronchitis: Secondary | ICD-10-CM

## 2023-08-09 NOTE — Telephone Encounter (Signed)
 Copied from CRM [id number]. Topic: Clinical - Medication Refill >> Aug 09, 2023 11:36 AM Howard Macho wrote: Most Recent Primary Care Visit:  Provider: ALLEN, NICKEAH E  Department: [facility]  Visit Type: MEDICARE AWV, SEQUENTIAL  Date: 03/31/2023  Medication: formoterol  (PERFOROMIST ) 20 MCG/2ML nebulizer solution  Has the patient contacted their pharmacy? No (Agent: If no, request that the patient contact the pharmacy for the refill. If patient does not wish to contact the pharmacy document the reason why and proceed with request.) (Agent: If yes, when and what did the pharmacy advise?)  Is this the correct pharmacy for this prescription? Yes If no, delete pharmacy and type the correct one.  This is the patient's preferred pharmacy:  [facility]) [phone] - [place], [place] - [address] AT [facility] [address] [place] [address] Phone: [phone] Fax: [phone]  Has the prescription been filled recently? No  Is the patient out of the medication? Yes  Has the patient been seen for an appointment in the last year OR does the patient have an upcoming appointment?   Can we respond through MyChart? Yes  Agent: Please be advised that Rx refills may take up to 3 business days. We ask that you follow-up with your pharmacy.

## 2023-08-10 ENCOUNTER — Other Ambulatory Visit: Payer: Self-pay | Admitting: Internal Medicine

## 2023-08-10 DIAGNOSIS — J9611 Chronic respiratory failure with hypoxia: Secondary | ICD-10-CM

## 2023-08-10 DIAGNOSIS — J418 Mixed simple and mucopurulent chronic bronchitis: Secondary | ICD-10-CM

## 2023-08-10 DIAGNOSIS — J449 Chronic obstructive pulmonary disease, unspecified: Secondary | ICD-10-CM | POA: Diagnosis not present

## 2023-08-10 MED ORDER — FORMOTEROL FUMARATE 20 MCG/2ML IN NEBU: 20.0000 ug | INHALATION_SOLUTION | Freq: Two times a day (BID) | RESPIRATORY_TRACT | 1 refills | Status: DC

## 2023-08-10 NOTE — Telephone Encounter (Signed)
 Last Fill: 03/30/23   Last OV: 03/31/23 AWV Next OV: 04/01/24 AWV  Routing to provider for review/authorization.

## 2023-08-27 ENCOUNTER — Other Ambulatory Visit: Payer: Self-pay | Admitting: Internal Medicine

## 2023-08-27 DIAGNOSIS — E7800 Pure hypercholesterolemia, unspecified: Secondary | ICD-10-CM

## 2023-09-10 DIAGNOSIS — J449 Chronic obstructive pulmonary disease, unspecified: Secondary | ICD-10-CM | POA: Diagnosis not present

## 2023-09-18 ENCOUNTER — Other Ambulatory Visit: Payer: Self-pay | Admitting: Internal Medicine

## 2023-09-18 DIAGNOSIS — J418 Mixed simple and mucopurulent chronic bronchitis: Secondary | ICD-10-CM

## 2023-09-22 ENCOUNTER — Other Ambulatory Visit: Payer: Self-pay | Admitting: Internal Medicine

## 2023-09-22 DIAGNOSIS — J418 Mixed simple and mucopurulent chronic bronchitis: Secondary | ICD-10-CM

## 2023-09-25 ENCOUNTER — Other Ambulatory Visit: Payer: Self-pay | Admitting: Internal Medicine

## 2023-09-25 DIAGNOSIS — F3342 Major depressive disorder, recurrent, in full remission: Secondary | ICD-10-CM

## 2023-10-10 DIAGNOSIS — J449 Chronic obstructive pulmonary disease, unspecified: Secondary | ICD-10-CM | POA: Diagnosis not present

## 2023-11-10 DIAGNOSIS — J449 Chronic obstructive pulmonary disease, unspecified: Secondary | ICD-10-CM | POA: Diagnosis not present

## 2023-11-20 ENCOUNTER — Other Ambulatory Visit: Payer: Self-pay | Admitting: Internal Medicine

## 2023-11-20 DIAGNOSIS — E7800 Pure hypercholesterolemia, unspecified: Secondary | ICD-10-CM

## 2023-11-20 MED ORDER — ROSUVASTATIN CALCIUM 10 MG PO TABS: ORAL_TABLET | ORAL | 0 refills | Status: DC

## 2023-11-22 ENCOUNTER — Other Ambulatory Visit: Payer: Self-pay | Admitting: Internal Medicine

## 2023-11-22 DIAGNOSIS — B9689 Other specified bacterial agents as the cause of diseases classified elsewhere: Secondary | ICD-10-CM | POA: Insufficient documentation

## 2023-11-22 MED ORDER — CEFPODOXIME PROXETIL 200 MG PO TABS: 200.0000 mg | ORAL_TABLET | Freq: Two times a day (BID) | ORAL | 0 refills | Status: AC

## 2023-11-28 DIAGNOSIS — H524 Presbyopia: Secondary | ICD-10-CM | POA: Diagnosis not present

## 2023-11-30 DIAGNOSIS — L570 Actinic keratosis: Secondary | ICD-10-CM | POA: Diagnosis not present

## 2023-11-30 DIAGNOSIS — Z85828 Personal history of other malignant neoplasm of skin: Secondary | ICD-10-CM | POA: Diagnosis not present

## 2023-11-30 DIAGNOSIS — D2272 Melanocytic nevi of left lower limb, including hip: Secondary | ICD-10-CM | POA: Diagnosis not present

## 2023-11-30 DIAGNOSIS — L821 Other seborrheic keratosis: Secondary | ICD-10-CM | POA: Diagnosis not present

## 2023-11-30 DIAGNOSIS — D3611 Benign neoplasm of peripheral nerves and autonomic nervous system of face, head, and neck: Secondary | ICD-10-CM | POA: Diagnosis not present

## 2023-11-30 DIAGNOSIS — D2262 Melanocytic nevi of left upper limb, including shoulder: Secondary | ICD-10-CM | POA: Diagnosis not present

## 2023-12-11 DIAGNOSIS — J449 Chronic obstructive pulmonary disease, unspecified: Secondary | ICD-10-CM | POA: Diagnosis not present

## 2023-12-23 ENCOUNTER — Other Ambulatory Visit: Payer: Self-pay | Admitting: Internal Medicine

## 2023-12-23 DIAGNOSIS — J418 Mixed simple and mucopurulent chronic bronchitis: Secondary | ICD-10-CM

## 2024-01-01 ENCOUNTER — Other Ambulatory Visit: Payer: Self-pay | Admitting: Internal Medicine

## 2024-01-01 DIAGNOSIS — F3342 Major depressive disorder, recurrent, in full remission: Secondary | ICD-10-CM

## 2024-01-03 ENCOUNTER — Other Ambulatory Visit: Payer: Self-pay | Admitting: Internal Medicine

## 2024-01-03 DIAGNOSIS — J418 Mixed simple and mucopurulent chronic bronchitis: Secondary | ICD-10-CM

## 2024-01-03 MED ORDER — ROFLUMILAST 500 MCG PO TABS
500.0000 ug | ORAL_TABLET | Freq: Every day | ORAL | 1 refills | Status: DC
  Filled 2024-03-13 – 2024-03-30 (×2): qty 90, 90d supply, fill #0

## 2024-01-04 ENCOUNTER — Other Ambulatory Visit: Payer: Self-pay | Admitting: Internal Medicine

## 2024-01-04 DIAGNOSIS — F3342 Major depressive disorder, recurrent, in full remission: Secondary | ICD-10-CM

## 2024-01-04 MED ORDER — PAROXETINE HCL 20 MG PO TABS
20.0000 mg | ORAL_TABLET | Freq: Every day | ORAL | 1 refills | Status: AC
  Filled 2024-03-13 – 2024-03-30 (×2): qty 90, 90d supply, fill #0

## 2024-01-10 DIAGNOSIS — J449 Chronic obstructive pulmonary disease, unspecified: Secondary | ICD-10-CM | POA: Diagnosis not present

## 2024-02-07 ENCOUNTER — Other Ambulatory Visit: Payer: Self-pay | Admitting: Internal Medicine

## 2024-02-07 DIAGNOSIS — J9611 Chronic respiratory failure with hypoxia: Secondary | ICD-10-CM

## 2024-02-07 DIAGNOSIS — J418 Mixed simple and mucopurulent chronic bronchitis: Secondary | ICD-10-CM

## 2024-02-25 ENCOUNTER — Other Ambulatory Visit: Payer: Self-pay | Admitting: Internal Medicine

## 2024-02-25 DIAGNOSIS — E7800 Pure hypercholesterolemia, unspecified: Secondary | ICD-10-CM

## 2024-02-27 ENCOUNTER — Other Ambulatory Visit: Payer: Self-pay | Admitting: Internal Medicine

## 2024-02-27 DIAGNOSIS — E7800 Pure hypercholesterolemia, unspecified: Secondary | ICD-10-CM

## 2024-02-27 MED ORDER — ROSUVASTATIN CALCIUM 10 MG PO TABS: ORAL_TABLET | ORAL | 0 refills | Status: DC

## 2024-03-11 DIAGNOSIS — J449 Chronic obstructive pulmonary disease, unspecified: Secondary | ICD-10-CM | POA: Diagnosis not present

## 2024-03-13 ENCOUNTER — Other Ambulatory Visit: Payer: Self-pay | Admitting: Internal Medicine

## 2024-03-13 ENCOUNTER — Other Ambulatory Visit: Payer: Self-pay

## 2024-03-13 ENCOUNTER — Other Ambulatory Visit (HOSPITAL_BASED_OUTPATIENT_CLINIC_OR_DEPARTMENT_OTHER): Payer: Self-pay

## 2024-03-13 DIAGNOSIS — E7800 Pure hypercholesterolemia, unspecified: Secondary | ICD-10-CM

## 2024-03-18 ENCOUNTER — Other Ambulatory Visit (HOSPITAL_COMMUNITY): Payer: Self-pay

## 2024-03-18 ENCOUNTER — Other Ambulatory Visit (HOSPITAL_BASED_OUTPATIENT_CLINIC_OR_DEPARTMENT_OTHER): Payer: Self-pay

## 2024-03-19 ENCOUNTER — Other Ambulatory Visit (HOSPITAL_BASED_OUTPATIENT_CLINIC_OR_DEPARTMENT_OTHER): Payer: Self-pay

## 2024-03-19 MED FILL — Formoterol Fumarate Soln Nebu 20 MCG/2ML: RESPIRATORY_TRACT | 90 days supply | Qty: 360 | Fill #0 | Status: CN

## 2024-03-19 MED FILL — Revefenacin Inhalation Solution 175 MCG/3ML: RESPIRATORY_TRACT | 90 days supply | Qty: 270 | Fill #0 | Status: CN

## 2024-03-23 ENCOUNTER — Other Ambulatory Visit (HOSPITAL_BASED_OUTPATIENT_CLINIC_OR_DEPARTMENT_OTHER): Payer: Self-pay

## 2024-03-30 ENCOUNTER — Other Ambulatory Visit (HOSPITAL_BASED_OUTPATIENT_CLINIC_OR_DEPARTMENT_OTHER): Payer: Self-pay

## 2024-03-30 MED FILL — Revefenacin Inhalation Solution 175 MCG/3ML: RESPIRATORY_TRACT | 90 days supply | Qty: 270 | Fill #0 | Status: AC

## 2024-04-01 ENCOUNTER — Ambulatory Visit: Payer: Medicare PPO

## 2024-04-01 ENCOUNTER — Other Ambulatory Visit: Payer: Self-pay | Admitting: Internal Medicine

## 2024-04-01 ENCOUNTER — Other Ambulatory Visit

## 2024-04-01 ENCOUNTER — Ambulatory Visit: Payer: Self-pay | Admitting: Internal Medicine

## 2024-04-01 ENCOUNTER — Telehealth: Payer: Self-pay | Admitting: Internal Medicine

## 2024-04-01 DIAGNOSIS — E785 Hyperlipidemia, unspecified: Secondary | ICD-10-CM

## 2024-04-01 DIAGNOSIS — N400 Enlarged prostate without lower urinary tract symptoms: Secondary | ICD-10-CM | POA: Diagnosis not present

## 2024-04-01 DIAGNOSIS — J9611 Chronic respiratory failure with hypoxia: Secondary | ICD-10-CM

## 2024-04-01 LAB — LIPID PANEL
Cholesterol: 187 mg/dL (ref 28–200)
HDL: 92.9 mg/dL
LDL Cholesterol: 80 mg/dL (ref 10–99)
NonHDL: 94.45
Total CHOL/HDL Ratio: 2
Triglycerides: 73 mg/dL (ref 10.0–149.0)
VLDL: 14.6 mg/dL (ref 0.0–40.0)

## 2024-04-01 LAB — CBC WITH DIFFERENTIAL/PLATELET
Basophils Absolute: 0 K/uL (ref 0.0–0.1)
Basophils Relative: 0.2 % (ref 0.0–3.0)
Eosinophils Absolute: 0.2 K/uL (ref 0.0–0.7)
Eosinophils Relative: 2.1 % (ref 0.0–5.0)
HCT: 41.7 % (ref 39.0–52.0)
Hemoglobin: 14.1 g/dL (ref 13.0–17.0)
Lymphocytes Relative: 45.1 % (ref 12.0–46.0)
Lymphs Abs: 4.1 K/uL — ABNORMAL HIGH (ref 0.7–4.0)
MCHC: 33.8 g/dL (ref 30.0–36.0)
MCV: 91.9 fl (ref 78.0–100.0)
Monocytes Absolute: 0.5 K/uL (ref 0.1–1.0)
Monocytes Relative: 5.4 % (ref 3.0–12.0)
Neutro Abs: 4.3 K/uL (ref 1.4–7.7)
Neutrophils Relative %: 47.2 % (ref 43.0–77.0)
Platelets: 328 K/uL (ref 150.0–400.0)
RBC: 4.54 Mil/uL (ref 4.22–5.81)
RDW: 12.7 % (ref 11.5–15.5)
WBC: 9.1 K/uL (ref 4.0–10.5)

## 2024-04-01 LAB — URINALYSIS, ROUTINE W REFLEX MICROSCOPIC
Bilirubin Urine: NEGATIVE
Hgb urine dipstick: NEGATIVE
Ketones, ur: NEGATIVE
Leukocytes,Ua: NEGATIVE
Nitrite: NEGATIVE
RBC / HPF: NONE SEEN
Specific Gravity, Urine: 1.01 (ref 1.000–1.030)
Total Protein, Urine: NEGATIVE
Urine Glucose: NEGATIVE
Urobilinogen, UA: 0.2 (ref 0.0–1.0)
WBC, UA: NONE SEEN
pH: 6 (ref 5.0–8.0)

## 2024-04-01 LAB — HEPATIC FUNCTION PANEL
ALT: 16 U/L (ref 3–53)
AST: 20 U/L (ref 5–37)
Albumin: 4.4 g/dL (ref 3.5–5.2)
Alkaline Phosphatase: 98 U/L (ref 39–117)
Bilirubin, Direct: 0.1 mg/dL (ref 0.1–0.3)
Total Bilirubin: 0.3 mg/dL (ref 0.2–1.2)
Total Protein: 6.9 g/dL (ref 6.0–8.3)

## 2024-04-01 LAB — BASIC METABOLIC PANEL WITH GFR
BUN: 18 mg/dL (ref 6–23)
CO2: 31 meq/L (ref 19–32)
Calcium: 9.4 mg/dL (ref 8.4–10.5)
Chloride: 99 meq/L (ref 96–112)
Creatinine, Ser: 0.76 mg/dL (ref 0.40–1.50)
GFR: 85.75 mL/min
Glucose, Bld: 104 mg/dL — ABNORMAL HIGH (ref 70–99)
Potassium: 4.4 meq/L (ref 3.5–5.1)
Sodium: 138 meq/L (ref 135–145)

## 2024-04-01 LAB — TSH: TSH: 4.01 u[IU]/mL (ref 0.35–5.50)

## 2024-04-01 LAB — PSA: PSA: 1.24 ng/mL (ref 0.10–4.00)

## 2024-04-01 NOTE — Telephone Encounter (Signed)
 Patient would like his lab's put in for tomorrows physical

## 2024-04-02 ENCOUNTER — Encounter: Payer: Self-pay | Admitting: Internal Medicine

## 2024-04-02 ENCOUNTER — Ambulatory Visit: Admitting: Internal Medicine

## 2024-04-02 ENCOUNTER — Ambulatory Visit

## 2024-04-02 ENCOUNTER — Other Ambulatory Visit (HOSPITAL_BASED_OUTPATIENT_CLINIC_OR_DEPARTMENT_OTHER): Payer: Self-pay

## 2024-04-02 VITALS — BP 122/72 | HR 84 | Temp 97.7°F | Resp 16 | Ht 66.0 in | Wt 122.2 lb

## 2024-04-02 DIAGNOSIS — J9611 Chronic respiratory failure with hypoxia: Secondary | ICD-10-CM | POA: Diagnosis not present

## 2024-04-02 DIAGNOSIS — Z0000 Encounter for general adult medical examination without abnormal findings: Secondary | ICD-10-CM | POA: Diagnosis not present

## 2024-04-02 DIAGNOSIS — J418 Mixed simple and mucopurulent chronic bronchitis: Secondary | ICD-10-CM | POA: Diagnosis not present

## 2024-04-02 DIAGNOSIS — Z0001 Encounter for general adult medical examination with abnormal findings: Secondary | ICD-10-CM | POA: Insufficient documentation

## 2024-04-02 DIAGNOSIS — E785 Hyperlipidemia, unspecified: Secondary | ICD-10-CM | POA: Diagnosis not present

## 2024-04-02 DIAGNOSIS — E7800 Pure hypercholesterolemia, unspecified: Secondary | ICD-10-CM

## 2024-04-02 DIAGNOSIS — J431 Panlobular emphysema: Secondary | ICD-10-CM | POA: Diagnosis not present

## 2024-04-02 MED ORDER — ROSUVASTATIN CALCIUM 10 MG PO TABS
ORAL_TABLET | ORAL | 0 refills | Status: AC
  Filled 2024-04-02: qty 90, 90d supply, fill #0

## 2024-04-02 MED ORDER — ROFLUMILAST 500 MCG PO TABS
500.0000 ug | ORAL_TABLET | Freq: Every day | ORAL | 1 refills | Status: AC
  Filled 2024-04-02: qty 90, 90d supply, fill #0

## 2024-04-02 MED ORDER — BUDESONIDE 0.5 MG/2ML IN SUSP
0.5000 mg | Freq: Every day | RESPIRATORY_TRACT | 1 refills | Status: AC
  Filled 2024-05-08: qty 60, 30d supply, fill #0

## 2024-04-02 NOTE — Progress Notes (Signed)
 "  Subjective:  Patient ID: Joshua Cross, male    DOB: 1945-11-06  Age: 78 y.o. MRN: [id number]  CC: Annual Exam, Hyperlipidemia, and COPD   HPI [name] presents for a CPX and f/up ---  Discussed the use of AI scribe software for clinical note transcription with the patient, who gave verbal consent to proceed.  History of Present Illness Joshua Cross is a 78 year old male who presents with concerns about shortness of breath and decreased physical activity.  He experiences shortness of breath, which he associates with panic when it occurs. He requires oxygen  frequently, and when he uses it, he feels fine. His breathing is not as good as it used to be, and he is unable to do activities he used to do, such as walking to the mailbox without difficulty. No dizziness, lightheadedness, or chest pain.  He describes a significant decrease in his physical activity, stating that he can no longer do as much as before. He has difficulty with activities that require exertion, such as walking to the mailbox, and feels that he cannot separate the shortness of breath from the panic it induces.  He has received a flu shot earlier this fall at [facility] but has not yet received the latest COVID vaccine. He has switched his pharmacy to [facility] and has informed him of this change.     Outpatient Medications Prior to Visit  Medication Sig Dispense Refill   Acetylcarnitine HCl (ACETYL L-CARNITINE PO) Take 750 mg by mouth daily. 750 MG DAILY     ASHWAGANDHA PO Take 3,000 mcg by mouth daily. 3000 MCG DAILY     Biotin 1 MG CAPS Take 1 mg by mouth daily.     Cholecalciferol (VITAMIN D) 50 MCG (2000 UT) CAPS Take 2,000 Units by mouth daily.     Coenzyme Q10 (CO Q 10 PO) Take 1 capsule by mouth daily.     formoterol  (PERFOROMIST ) 20 MCG/2ML nebulizer solution Take 2 mLs (20 mcg total) by nebulization 2 (two) times daily. 360 mL 1   Melatonin 10 MG CAPS Take 10 mg by mouth at  bedtime.     Multiple Vitamin (MULTIVITAMIN) tablet Take 1 tablet by mouth daily. CENTRUM SILVER DAILY     OVER THE COUNTER MEDICATION Take 2,400 mcg by mouth in the morning and at bedtime. MUSCADINE SEED 2400 MCG 2 DAILY     PARoxetine  (PAXIL ) 20 MG tablet Take 1 tablet (20 mg total) by mouth daily. 90 tablet 1   Probiotic Product (PROBIOTIC ADVANCED PO) Take 1 capsule by mouth daily. From deep roots     revefenacin  (YUPELRI ) 175 MCG/3ML nebulizer solution Take 3 mLs (175 mcg total) by nebulization daily. 270 mL 1   Sod Fluoride-Potassium Nitrate (PREVIDENT 5000 SENSITIVE) 1.1-5 % GEL PreviDent 5000 Enamel Protect 1.1 %-5 % dental paste     TART CHERRY PO Take 2,400 mcg by mouth daily. 2400 MCG DAILY     budesonide  (PULMICORT ) 0.5 MG/2ML nebulizer solution INHALE USING NEBULIZER IN THE MORNING AND AT BEDTIME 360 mL 1   roflumilast  (DALIRESP ) 500 MCG TABS tablet Take 1 tablet (500 mcg total) by mouth daily. 90 tablet 1   rosuvastatin  (CRESTOR ) 10 MG tablet TAKE 1 TABLET(10 MG) BY MOUTH DAILY. 90 tablet 0   ipratropium (ATROVENT ) 0.03 % nasal spray INSTILL 2 SPRAYS INTO EACH NOSTRIL EVERY 12 HOURS (Patient taking differently: Place 2 sprays into both nostrils 2 (two) times daily as needed for rhinitis.) 30  mL 5   No facility-administered medications prior to visit.    ROS Review of Systems  Constitutional:  Negative for appetite change, chills, diaphoresis, fatigue and fever.  HENT: Negative.    Eyes: Negative.   Respiratory:  Positive for shortness of breath. Negative for cough, chest tightness and wheezing.   Cardiovascular:  Negative for chest pain, palpitations and leg swelling.  Gastrointestinal: Negative.  Negative for abdominal pain, constipation, diarrhea, nausea and vomiting.  Endocrine: Negative.   Genitourinary: Negative.   Musculoskeletal: Negative.  Negative for arthralgias and myalgias.  Skin: Negative.  Negative for color change.  Neurological:  Negative for dizziness,  weakness, light-headedness and headaches.  Hematological:  Negative for adenopathy. Does not bruise/bleed easily.  Psychiatric/Behavioral: Negative.      Objective:  BP 122/72 (BP Location: Left Arm, Patient Position: Sitting, Cuff Size: Normal)   Pulse 84   Temp 97.7 F (36.5 C) (Oral)   Resp 16   Ht 5' 6 (1.676 m)   Wt 122 lb 3.2 oz (55.4 kg)   SpO2 96%   BMI 19.72 kg/m   BP Readings from Last 3 Encounters:  04/02/24 122/72  12/19/22 118/62  06/23/22 118/64    Wt Readings from Last 3 Encounters:  04/02/24 122 lb 3.2 oz (55.4 kg)  12/19/22 121 lb (54.9 kg)  06/23/22 122 lb (55.3 kg)    Physical Exam Vitals reviewed.  Constitutional:      General: He is not in acute distress.    Appearance: Normal appearance. He is not toxic-appearing or diaphoretic.  HENT:     Mouth/Throat:     Mouth: Mucous membranes are moist.  Eyes:     General: No scleral icterus.    Pupils: Pupils are equal, round, and reactive to light.  Cardiovascular:     Rate and Rhythm: Normal rate and regular rhythm.     Heart sounds: No murmur heard.    No friction rub. No gallop.  Pulmonary:     Effort: Pulmonary effort is normal.     Breath sounds: No stridor. No wheezing, rhonchi or rales.  Abdominal:     General: Abdomen is flat.     Palpations: There is no mass.     Tenderness: There is no abdominal tenderness. There is no guarding or rebound.     Hernia: No hernia is present.  Musculoskeletal:        General: Normal range of motion.     Cervical back: Neck supple.     Right lower leg: No edema.     Left lower leg: No edema.  Lymphadenopathy:     Cervical: No cervical adenopathy.  Skin:    General: Skin is warm and dry.     Coloration: Skin is not jaundiced.  Neurological:     General: No focal deficit present.     Mental Status: He is alert.  Psychiatric:        Mood and Affect: Mood normal.        Behavior: Behavior normal.     Lab Results  Component Value Date   WBC 9.1  04/01/2024   HGB 14.1 04/01/2024   HCT 41.7 04/01/2024   PLT 328.0 04/01/2024   GLUCOSE 104 (H) 04/01/2024   CHOL 187 04/01/2024   TRIG 73.0 04/01/2024   HDL 92.90 04/01/2024   LDLCALC 80 04/01/2024   ALT 16 04/01/2024   AST 20 04/01/2024   NA 138 04/01/2024   K 4.4 04/01/2024   CL 99 04/01/2024  CREATININE 0.76 04/01/2024   BUN 18 04/01/2024   CO2 31 04/01/2024   TSH 4.01 04/01/2024   PSA 1.24 04/01/2024   HGBA1C 5.9 (H) 11/13/2019   MICROALBUR 0.2 11/13/2019    CT CHEST WO CONTRAST Result Date: 07/06/2022 CLINICAL DATA:  Chest pain, nonspecific. Emphysema, COPD, right lobectomy. History of tobacco use. EXAM: CT CHEST WITHOUT CONTRAST TECHNIQUE: Multidetector CT imaging of the chest was performed following the standard protocol without IV contrast. RADIATION DOSE REDUCTION: This exam was performed according to the departmental dose-optimization program which includes automated exposure control, adjustment of the mA and/or kV according to patient size and/or use of iterative reconstruction technique. COMPARISON:  12/06/2016. FINDINGS: Cardiovascular: Heart is normal in size and there is no pericardial effusion. Scattered coronary artery calcifications are noted. There is atherosclerotic calcification of the aorta without evidence of aneurysm. The pulmonary trunk is normal in caliber. Mediastinum/Nodes: No mediastinal or axillary lymphadenopathy. Evaluation of the hilum is limited due to lack of IV contrast. The thyroid  gland, trachea, and esophagus are within normal limits. Lungs/Pleura: Severe emphysematous changes are noted in the lungs. Postsurgical changes are present in the anterior aspect of the right lung. There is a 6 mm nodule in the right lower lobe, axial image 134, unchanged from 2018. Stable scarring is noted at the left lung apex. No effusion or pneumothorax. Upper Abdomen: No acute abnormality. Musculoskeletal: Mild gynecomastia is noted bilaterally. Degenerative changes are  present in the thoracic spine. No acute or suspicious osseous abnormality. IMPRESSION: 1. Stable right lower lobe 6 mm pulmonary nodule, unchanged from 2018 and likely benign. 2. Severe emphysema. 3. Scattered coronary artery calcifications. 4. Aortic atherosclerosis. Electronically Signed   By: Leita Birmingham M.D.   On: 07/06/2022 22:16    Assessment & Plan:   Hyperlipidemia with target LDL less than 130- LDL goal achieved. Doing well on the statin   Chronic respiratory failure with hypoxia (HCC)- Doing well on continuous oxygen .  Encounter for general adult medical examination with abnormal findings- Exam completed, labs reviewed, vaccines reviewed and updated, no cancer screenings indicated, pt ed material was given.   Panlobular emphysema (HCC)- Stable.  Mixed simple and mucopurulent chronic bronchitis (HCC) -     Budesonide ; Take 2 mLs (0.5 mg total) by nebulization daily.  Dispense: 360 mL; Refill: 1 -     Roflumilast ; Take 1 tablet (500 mcg total) by mouth daily.  Dispense: 90 tablet; Refill: 1  Pure hypercholesterolemia -     Rosuvastatin  Calcium ; TAKE 1 TABLET(10 MG) BY MOUTH DAILY.  Dispense: 90 tablet; Refill: 0     Follow-up: No follow-ups on file.  Debby Molt, MD "

## 2024-04-08 NOTE — Telephone Encounter (Signed)
 Patient has already been seen and had his labs done.

## 2024-04-09 ENCOUNTER — Other Ambulatory Visit (HOSPITAL_BASED_OUTPATIENT_CLINIC_OR_DEPARTMENT_OTHER): Payer: Self-pay

## 2024-04-09 ENCOUNTER — Ambulatory Visit (INDEPENDENT_AMBULATORY_CARE_PROVIDER_SITE_OTHER)

## 2024-04-09 VITALS — BP 122/72 | HR 80 | Ht 66.0 in | Wt 122.0 lb

## 2024-04-09 DIAGNOSIS — Z0000 Encounter for general adult medical examination without abnormal findings: Secondary | ICD-10-CM | POA: Diagnosis not present

## 2024-04-09 MED ORDER — COMIRNATY 30 MCG/0.3ML IM SUSY
0.3000 mL | PREFILLED_SYRINGE | Freq: Once | INTRAMUSCULAR | 0 refills | Status: AC
  Filled 2024-04-09: qty 0.3, 1d supply, fill #0

## 2024-04-09 NOTE — Patient Instructions (Signed)
 Joshua Cross,  Thank you for taking the time for your Medicare Wellness Visit. I appreciate your continued commitment to your health goals. Please review the care plan we discussed, and feel free to reach out if I can assist you further.  Please note that Annual Wellness Visits do not include a physical exam. Some assessments may be limited, especially if the visit was conducted virtually. If needed, we may recommend an in-person follow-up with your provider.  Ongoing Care Seeing your primary care provider every 3 to 6 months helps us  monitor your health and provide consistent, personalized care.   Referrals If a referral was made during today's visit and you haven't received any updates within two weeks, please contact the referred provider directly to check on the status.  Recommended Screenings:  Health Maintenance  Topic Date Due   Colon Cancer Screening  04/02/2025*   Yearly kidney health urinalysis for diabetes  12/19/2027*   COVID-19 Vaccine (9 - 2025-26 season) 06/04/2024   Yearly kidney function blood test for diabetes  04/01/2025   Medicare Annual Wellness Visit  04/09/2025   DTaP/Tdap/Td vaccine (3 - Td or Tdap) 03/07/2026   Pneumococcal Vaccine for age over 70  Completed   Flu Shot  Completed   Hepatitis C Screening  Completed   Zoster (Shingles) Vaccine  Completed   Meningitis B Vaccine  Aged Out   Complete foot exam   Discontinued   Hemoglobin A1C  Discontinued   Eye exam for diabetics  Discontinued  *Topic was postponed. The date shown is not the original due date.       04/09/2024    1:37 PM  Advanced Directives  Does Patient Have a Medical Advance Directive? Yes  Type of Estate Agent of [place];Living will  Does patient want to make changes to medical advance directive? Yes (Inpatient - patient requests chaplain consult to change a medical advance directive)  Copy of Healthcare Power of Attorney in Chart? Yes - validated most recent  copy scanned in chart (See row information)    Vision: Annual vision screenings are recommended for early detection of glaucoma, cataracts, and diabetic retinopathy. These exams can also reveal signs of chronic conditions such as diabetes and high blood pressure.  Dental: Annual dental screenings help detect early signs of oral cancer, gum disease, and other conditions linked to overall health, including heart disease and diabetes.

## 2024-04-09 NOTE — Progress Notes (Signed)
 "  Chief Complaint  Patient presents with   Medicare Wellness     Subjective:   Joshua Cross is a 78 y.o. male who presents for a Medicare Annual Wellness Visit.  Visit info / Clinical Intake: Medicare Wellness Visit Type:: Subsequent Annual Wellness Visit Persons participating in visit and providing information:: patient Medicare Wellness Visit Mode:: In-person (required for WTM) Interpreter Needed?: No Pre-visit prep was completed: yes AWV questionnaire completed by patient prior to visit?: yes Date:: 04/08/24 Living arrangements:: (!) lives alone Patient's Overall Health Status Rating: good Typical amount of pain: none Does pain affect daily life?: no Are you currently prescribed opioids?: no  Dietary Habits and Nutritional Risks How many meals a day?: 2 Eats fruit and vegetables daily?: yes Most meals are obtained by: preparing own meals In the last 2 weeks, have you had any of the following?: none Diabetic:: no  Functional Status Activities of Daily Living (to include ambulation/medication): Independent Ambulation: Independent with device- listed below Home Assistive Devices/Equipment: Eyeglasses; Oxygen  (oxygen  - 3liters) Medication Administration: Independent Home Management (perform basic housework or laundry): Independent Manage your own finances?: yes Primary transportation is: driving Concerns about vision?: no *vision screening is required for WTM* Concerns about hearing?: no  Fall Screening Falls in the past year?: 0 Number of falls in past year: 0 Was there an injury with Fall?: 0 Fall Risk Category Calculator: 0 Patient Fall Risk Level: Low Fall Risk  Fall Risk Patient at Risk for Falls Due to: No Fall Risks Fall risk Follow up: Falls evaluation completed; Falls prevention discussed  Home and Transportation Safety: All rugs have non-skid backing?: yes All stairs or steps have railings?: N/A, no stairs Grab bars in the bathtub or shower?: (!)  no Have non-skid surface in bathtub or shower?: yes Good home lighting?: yes Regular seat belt use?: yes Hospital stays in the last year:: no  Cognitive Assessment Difficulty concentrating, remembering, or making decisions? : no Will 6CIT or Mini Cog be Completed: yes What year is it?: 0 points What month is it?: 0 points Give patient an address phrase to remember (5 components): [address] [place], [place] About what time is it?: 0 points Count backwards from 20 to 1: 0 points Say the months of the year in reverse: 0 points Repeat the address phrase from earlier: 0 points 6 CIT Score: 0 points  Advance Directives (For Healthcare) Does Patient Have a Medical Advance Directive?: Yes Does patient want to make changes to medical advance directive?: Yes (Inpatient - patient requests chaplain consult to change a medical advance directive) Type of Advance Directive: Healthcare Power of [place]; Living will Copy of Healthcare Power of Attorney in Chart?: Yes - validated most recent copy scanned in chart (See row information) Copy of Living Will in Chart?: Yes - validated most recent copy scanned in chart (See row information)  Reviewed/Updated  Reviewed/Updated: Reviewed All (Medical, Surgical, Family, Medications, Allergies, Care Teams, Patient Goals)    Allergies (verified) Erythromycin and Penicillins   Current Medications (verified) Outpatient Encounter Medications as of 04/09/2024  Medication Sig   Acetylcarnitine HCl (ACETYL L-CARNITINE PO) Take 750 mg by mouth daily. 750 MG DAILY   ASHWAGANDHA PO Take 3,000 mcg by mouth daily. 3000 MCG DAILY   Biotin 1 MG CAPS Take 1 mg by mouth daily.   budesonide  (PULMICORT ) 0.5 MG/2ML nebulizer solution Take 2 mLs (0.5 mg total) by nebulization daily.   Cholecalciferol (VITAMIN D) 50 MCG (2000 UT) CAPS Take 2,000 Units by  mouth daily.   Coenzyme Q10 (CO Q 10 PO) Take 1 capsule by mouth daily.   COVID-19 mRNA vaccine, Pfizer, (COMIRNATY )  syringe Inject 0.3 mLs into the muscle once for 1 dose.   formoterol  (PERFOROMIST ) 20 MCG/2ML nebulizer solution Take 2 mLs (20 mcg total) by nebulization 2 (two) times daily.   Melatonin 10 MG CAPS Take 10 mg by mouth at bedtime.   Multiple Vitamin (MULTIVITAMIN) tablet Take 1 tablet by mouth daily. CENTRUM SILVER DAILY   OVER THE COUNTER MEDICATION Take 2,400 mcg by mouth in the morning and at bedtime. MUSCADINE SEED 2400 MCG 2 DAILY   PARoxetine  (PAXIL ) 20 MG tablet Take 1 tablet (20 mg total) by mouth daily.   Probiotic Product (PROBIOTIC ADVANCED PO) Take 1 capsule by mouth daily. From deep roots   revefenacin  (YUPELRI ) 175 MCG/3ML nebulizer solution Take 3 mLs (175 mcg total) by nebulization daily.   roflumilast  (DALIRESP ) 500 MCG TABS tablet Take 1 tablet (500 mcg total) by mouth daily.   rosuvastatin  (CRESTOR ) 10 MG tablet TAKE 1 TABLET(10 MG) BY MOUTH DAILY.   Sod Fluoride-Potassium Nitrate (PREVIDENT 5000 SENSITIVE) 1.1-5 % GEL PreviDent 5000 Enamel Protect 1.1 %-5 % dental paste   TART CHERRY PO Take 2,400 mcg by mouth daily. 2400 MCG DAILY   No facility-administered encounter medications on file as of 04/09/2024.    History: Past Medical History:  Diagnosis Date   Allergy    MILD    Anxiety    MILD    Arthritis    Blood transfusion without reported diagnosis    Cataract    REMOVED BOTH EYES    Colon polyp    COPD (chronic obstructive pulmonary disease) (HCC)    Emphysema of lung (HCC)    Hyperlipidemia    Oxygen  deficiency    2.5 -3 LITERS - USES DAY AND NIGHT AND USES OFTEN    Past Surgical History:  Procedure Laterality Date   cataracts Bilateral 2019   COLONOSCOPY     COLONOSCOPY WITH PROPOFOL  N/A 11/12/2020   Procedure: COLONOSCOPY WITH PROPOFOL ;  Surgeon: Abran Norleen SAILOR, MD;  Location: [facility];  Service: Endoscopy;  Laterality: N/A;   HERNIA REPAIR     LUNG REMOVAL, PARTIAL Right 1985   POLYPECTOMY     POLYPECTOMY  11/12/2020   Procedure: POLYPECTOMY;   Surgeon: Abran Norleen SAILOR, MD;  Location: [facility];  Service: Endoscopy;;   TONSILLECTOMY     Family History  Problem Relation Age of Onset   Alcohol abuse Mother    Stroke Father    Depression Sister    Heart disease Sister    Cancer Neg Hx    COPD Neg Hx    Early death Neg Hx    Kidney disease Neg Hx    Hypertension Neg Hx    Learning disabilities Neg Hx    Colon cancer Neg Hx    Colon polyps Neg Hx    Esophageal cancer Neg Hx    Rectal cancer Neg Hx    Stomach cancer Neg Hx    Social History   Occupational History   Occupation: retired  Tobacco Use   Smoking status: Former    Current packs/day: 0.00    Average packs/day: 0.3 packs/day for 50.0 years (12.5 ttl pk-yrs)    Types: Cigarettes    Start date: 08/07/1954    Quit date: 08/06/2004    Years since quitting: 19.6   Smokeless tobacco: Never  Vaping Use   Vaping status: Never Used  Substance and Sexual Activity   Alcohol use: No   Drug use: No   Sexual activity: Not Currently   Tobacco Counseling Counseling given: Yes  SDOH Screenings   Food Insecurity: No Food Insecurity (04/09/2024)  Housing: Unknown (04/09/2024)  Transportation Needs: No Transportation Needs (04/09/2024)  Utilities: Not At Risk (04/09/2024)  Alcohol Screen: Low Risk (03/27/2023)  Depression (PHQ2-9): Low Risk (04/09/2024)  Financial Resource Strain: Low Risk (03/31/2024)  Physical Activity: Inactive (04/09/2024)  Social Connections: Moderately Isolated (04/09/2024)  Stress: No Stress Concern Present (04/09/2024)  Tobacco Use: Medium Risk (04/09/2024)  Health Literacy: Adequate Health Literacy (04/09/2024)   See flowsheets for full screening details  Depression Screen PHQ 2 & 9 Depression Scale- Over the past 2 weeks, how often have you been bothered by any of the following problems? Little interest or pleasure in doing things: 0 Feeling down, depressed, or hopeless (PHQ Adolescent also includes...irritable): 0 PHQ-2 Total Score:  0 Trouble falling or staying asleep, or sleeping too much: 0 Feeling tired or having little energy: 0 Poor appetite or overeating (PHQ Adolescent also includes...weight loss): 0 Feeling bad about yourself - or that you are a failure or have let yourself or your family down: 0 Trouble concentrating on things, such as reading the newspaper or watching television (PHQ Adolescent also includes...like school work): 0 Moving or speaking so slowly that other people could have noticed. Or the opposite - being so fidgety or restless that you have been moving around a lot more than usual: 0 Thoughts that you would be better off dead, or of hurting yourself in some way: 0 PHQ-9 Total Score: 0 If you checked off any problems, how difficult have these problems made it for you to do your work, take care of things at home, or get along with other people?: Not difficult at all  Depression Treatment Depression Interventions/Treatment : [id number] Score <4 Follow-up Not Indicated     Goals Addressed               This Visit's Progress     Patient Stated (pt-stated)        Patient state he plans to continue staying active             Objective:    Today's Vitals   04/09/24 1335  BP: 122/72  Pulse: 80  SpO2: 98%  Weight: 122 lb (55.3 kg)  Height: 5' 6 (1.676 m)   Body mass index is 19.69 kg/m.  Hearing/Vision screen Hearing Screening - Comments:: Denies hearing difficulties   Vision Screening - Comments:: Wears eyeglasses for reading - up to date with routine eye exams with [facility] Immunizations and Health Maintenance Health Maintenance  Topic Date Due   Colonoscopy  04/02/2025 (Originally 11/13/2023)   Diabetic kidney evaluation - Urine ACR  12/19/2027 (Originally 11/12/2020)   COVID-19 Vaccine (9 - 2025-26 season) 06/04/2024   Diabetic kidney evaluation - eGFR measurement  04/01/2025   Medicare Annual Wellness (AWV)  04/09/2025   DTaP/Tdap/Td (3 - Td or Tdap) 03/07/2026    Pneumococcal Vaccine: 50+ Years  Completed   Influenza Vaccine  Completed   Hepatitis C Screening  Completed   Zoster Vaccines- Shingrix   Completed   Meningococcal B Vaccine  Aged Out   FOOT EXAM  Discontinued   HEMOGLOBIN A1C  Discontinued   OPHTHALMOLOGY EXAM  Discontinued        Assessment/Plan:  This is a routine wellness examination for Dedric.  Patient Care Team: Joshua Debby CROME, MD as  PCP - General (Internal Medicine) Abran Norleen SAILOR, MD as Consulting Physician (Gastroenterology) Maree Lonni Inks, MD as Consulting Physician (Ophthalmology) Glendia Simmonds, OD as Referring Physician (Optometry)  I have personally reviewed and noted the following in the patients chart:   Medical and social history Use of alcohol, tobacco or illicit drugs  Current medications and supplements including opioid prescriptions. Functional ability and status Nutritional status Physical activity Advanced directives List of other physicians Hospitalizations, surgeries, and ER visits in previous 12 months Vitals Screenings to include cognitive, depression, and falls Referrals and appointments  No orders of the defined types were placed in this encounter.  In addition, I have reviewed and discussed with patient certain preventive protocols, quality metrics, and best practice recommendations. A written personalized care plan for preventive services as well as general preventive health recommendations were provided to patient.   Verdie CHRISTELLA Saba, CMA   04/09/2024   Return in 1 year (on 04/09/2025).  After Visit Summary: (In Person-Declined) Patient declined AVS at this time.  Nurse Notes: scheduled 2026 CPE appt w/PCP "

## 2024-05-08 ENCOUNTER — Other Ambulatory Visit (HOSPITAL_BASED_OUTPATIENT_CLINIC_OR_DEPARTMENT_OTHER): Payer: Self-pay

## 2024-05-08 ENCOUNTER — Other Ambulatory Visit: Payer: Self-pay

## 2024-05-09 ENCOUNTER — Other Ambulatory Visit (HOSPITAL_BASED_OUTPATIENT_CLINIC_OR_DEPARTMENT_OTHER): Payer: Self-pay

## 2024-05-10 ENCOUNTER — Other Ambulatory Visit (HOSPITAL_COMMUNITY): Payer: Self-pay

## 2024-05-10 ENCOUNTER — Other Ambulatory Visit (HOSPITAL_BASED_OUTPATIENT_CLINIC_OR_DEPARTMENT_OTHER): Payer: Self-pay

## 2025-04-03 ENCOUNTER — Encounter: Admitting: Internal Medicine

## 2025-04-14 ENCOUNTER — Ambulatory Visit
